# Patient Record
Sex: Female | Born: 1980 | Race: Black or African American | Hispanic: No | Marital: Married | Smoking: Never smoker
Health system: Southern US, Community
[De-identification: ages and names within clinical notes are randomized; demographics above are authoritative.]

## PROBLEM LIST (undated history)

## (undated) DIAGNOSIS — F329 Major depressive disorder, single episode, unspecified: Secondary | ICD-10-CM

## (undated) DIAGNOSIS — N979 Female infertility, unspecified: Secondary | ICD-10-CM

## (undated) DIAGNOSIS — F32A Depression, unspecified: Secondary | ICD-10-CM

## (undated) DIAGNOSIS — K5090 Crohn's disease, unspecified, without complications: Secondary | ICD-10-CM

## (undated) DIAGNOSIS — D219 Benign neoplasm of connective and other soft tissue, unspecified: Secondary | ICD-10-CM

## (undated) DIAGNOSIS — F419 Anxiety disorder, unspecified: Secondary | ICD-10-CM

## (undated) HISTORY — DX: Anxiety disorder, unspecified: F41.9

## (undated) HISTORY — DX: Female infertility, unspecified: N97.9

## (undated) HISTORY — DX: Major depressive disorder, single episode, unspecified: F32.9

## (undated) HISTORY — PX: EYE SURGERY: SHX253

## (undated) HISTORY — DX: Benign neoplasm of connective and other soft tissue, unspecified: D21.9

## (undated) HISTORY — DX: Depression, unspecified: F32.A

---
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
## 2011-12-04 ENCOUNTER — Encounter (HOSPITAL_BASED_OUTPATIENT_CLINIC_OR_DEPARTMENT_OTHER): Payer: Self-pay | Admitting: *Deleted

## 2011-12-04 ENCOUNTER — Emergency Department (HOSPITAL_BASED_OUTPATIENT_CLINIC_OR_DEPARTMENT_OTHER)
Admission: EM | Admit: 2011-12-04 | Discharge: 2011-12-04 | Disposition: A | Discharge status: Home / Self Care | Payer: Self-pay | Attending: Emergency Medicine | Admitting: Emergency Medicine

## 2011-12-04 DIAGNOSIS — J069 Acute upper respiratory infection, unspecified: Secondary | ICD-10-CM

## 2011-12-04 DIAGNOSIS — J029 Acute pharyngitis, unspecified: Secondary | ICD-10-CM | POA: Insufficient documentation

## 2011-12-04 NOTE — Discharge Instructions (Signed)
Antibiotic Nonuse  Your caregiver felt that the infection or problem was not one that would be helped with an antibiotic. Infections may be caused by viruses or bacteria. Only a caregiver can tell which one of these is the likely cause of an illness. A cold is the most common cause of infection in both adults and children. A cold is a virus. Antibiotic treatment will have no effect on a viral infection. Viruses can lead to many lost days of work caring for sick children and many missed days of school. Children may catch as many as 10 "colds" or "flus" per year during which they can be tearful, cranky, and uncomfortable. The goal of treating a virus is aimed at keeping the ill person comfortable. Antibiotics are medications used to help the body fight bacterial infections. There are relatively few types of bacteria that cause infections but there are hundreds of viruses. While both viruses and bacteria cause infection they are very different types of germs. A viral infection will typically go away by itself within 7 to 10 days. Bacterial infections may spread or get worse without antibiotic treatment. Examples of bacterial infections are:  Sore throats (like strep throat or tonsillitis).   Infection in the lung (pneumonia).   Ear and skin infections.  Examples of viral infections are:  Colds or flus.   Most coughs and bronchitis.   Sore throats not caused by Strep.   Runny noses.  It is often best not to take an antibiotic when a viral infection is the cause of the problem. Antibiotics can kill off the helpful bacteria that we have inside our body and allow harmful bacteria to start growing. Antibiotics can cause side effects such as allergies, nausea, and diarrhea without helping to improve the symptoms of the viral infection. Additionally, repeated uses of antibiotics can cause bacteria inside of our body to become resistant. That resistance can be passed onto harmful bacterial. The next time  you have an infection it may be harder to treat if antibiotics are used when they are not needed. Not treating with antibiotics allows our own immune system to develop and take care of infections more efficiently. Also, antibiotics will work better for [place] when they are prescribed for bacterial infections. Treatments for a child that is ill may include:  Give extra fluids throughout the day to stay hydrated.   Get plenty of rest.   Only give your child over-the-counter or prescription medicines for pain, discomfort, or fever as directed by your caregiver.   The use of a cool mist humidifier may help stuffy noses.   Cold medications if suggested by your caregiver.  Your caregiver may decide to start you on an antibiotic if:  The problem you were seen for today continues for a longer length of time than expected.   You develop a secondary bacterial infection.  SEEK MEDICAL CARE IF:  Fever lasts longer than 5 days.   Symptoms continue to get worse after 5 to 7 days or become severe.   Difficulty in breathing develops.  Signs of dehydration develop (poor Upper Respiratory Infection, Adult An upper respiratory infection (URI) is also known as the common cold. It is often caused by a type of germ (virus). Colds are easily spread (contagious). You can pass it to others by kissing, coughing, sneezing, or drinking out of the same glass. Usually, you get better in 1 or 2 weeks.  HOME CARE  Only take medicine as told by your doctor.  Use  a warm mist humidifier or breathe in steam from a hot shower.  Drink enough water and fluids to keep your pee (urine) clear or pale yellow.  Get plenty of rest.  Return to work when your temperature is back to normal or as told by your doctor. You may use a face mask and wash your hands to stop your cold from spreading.  GET HELP RIGHT AWAY IF:  After the first few days, you feel you are getting worse.  You have questions about your medicine.  You have chills,  shortness of breath, or brown or red spit (mucus).  You have yellow or brown snot (nasal discharge) or pain in the face, especially when you bend forward.  You have a fever, puffy (swollen) neck, pain when you swallow, or white spots in the back of your throat.  You have a bad headache, ear pain, sinus pain, or chest pain.  You have a high-pitched whistling sound when you breathe in and out (wheezing).  You have a lasting cough or cough up blood.  You have sore muscles or a stiff neck.  MAKE SURE YOU:  Understand these instructions.  Will watch your condition.  Will get help right away if you are not doing well or get worse.  Document Released: 02/15/2008 Document Revised: 08/18/2011 Document Reviewed: 01/03/2011  [employer] Patient Information 2012 [employer]drinking, rare urinating, dark colored urine).   Changes in behavior or worsening tiredness (listlessness or lethargy).  Document Released: 11/07/2001 Document Revised: 08/18/2011 Document Reviewed: 05/06/2009 [facility] Patient Information 2012 [place].

## 2011-12-04 NOTE — ED Notes (Signed)
Pt states that other family members have been sick with similar s/s. She describes sore throat since last night. Congested.

## 2011-12-04 NOTE — ED Provider Notes (Signed)
History     CSN: [id number]  Arrival date & time 12/04/11  1245   First MD Initiated Contact with Patient 12/04/11 1351      Chief Complaint  Patient presents with  . Sore Throat    (Consider location/radiation/quality/duration/timing/severity/associated sxs/prior treatment) HPI  Patient with cough and cold symptoms for several days. She has had nasal congestion. She has not had fever at home. She's been taking over-the-counter cold medicines without relief. She denies nausea vomiting or diarrhea.  History reviewed. No pertinent past medical history.  Past Surgical History  Procedure Date  . Colon surgery   . Eye surgery     History reviewed. No pertinent family history.  History  Substance Use Topics  . Smoking status: Never Smoker   . Smokeless tobacco: Not on file  . Alcohol Use: No    OB History    Grav Para Term Preterm Abortions TAB SAB Ect Mult Living                  Review of Systems  All other systems reviewed and are negative.    Allergies  Review of patient's allergies indicates no known allergies.  Home Medications  No current outpatient prescriptions on file.  BP 119/73  Pulse 86  Temp(Src) 98.5 F (36.9 C) (Oral)  Resp 20  Ht 5' 5"  (1.651 m)  Wt 180 lb (81.647 kg)  BMI 29.95 kg/m2  SpO2 99%  LMP 11/13/2011  Physical Exam  Nursing note and vitals reviewed. Constitutional: She is oriented to person, place, and time. She appears well-developed and well-nourished.  HENT:  Head: Normocephalic and atraumatic.  Eyes: Conjunctivae and EOM are normal. Pupils are equal, round, and reactive to light.  Neck: Normal range of motion. Neck supple.  Cardiovascular: Normal rate, regular rhythm, normal heart sounds and intact distal pulses.   Pulmonary/Chest: Effort normal and breath sounds normal.  Abdominal: Soft. Bowel sounds are normal.  Musculoskeletal: Normal range of motion.  Neurological: She is alert and oriented to person, place, and  time.  Skin: Skin is warm and dry.  Psychiatric: She has a normal mood and affect. Thought content normal.    ED Course  Procedures (including critical care time)   Labs Reviewed  RAPID STREP SCREEN   No results found.   No diagnosis found.    MDM  The patient has strep screen done prior to my valuation which is negative.       Shaune Pollack, MD 12/04/11 [phone]

## 2014-02-26 ENCOUNTER — Encounter (HOSPITAL_COMMUNITY): Payer: Self-pay | Admitting: Emergency Medicine

## 2014-02-26 ENCOUNTER — Emergency Department (HOSPITAL_COMMUNITY)
Admission: EM | Admit: 2014-02-26 | Discharge: 2014-02-26 | Disposition: A | Discharge status: Home / Self Care | Payer: Self-pay | Attending: Emergency Medicine | Admitting: Emergency Medicine

## 2014-02-26 DIAGNOSIS — K921 Melena: Secondary | ICD-10-CM | POA: Insufficient documentation

## 2014-02-26 DIAGNOSIS — Z79899 Other long term (current) drug therapy: Secondary | ICD-10-CM | POA: Insufficient documentation

## 2014-02-26 LAB — COMPREHENSIVE METABOLIC PANEL
ALBUMIN: 3.6 g/dL (ref 3.5–5.2)
ALK PHOS: 88 U/L (ref 39–117)
ALT: 14 U/L (ref 0–35)
AST: 17 U/L (ref 0–37)
BILIRUBIN TOTAL: 0.2 mg/dL — AB (ref 0.3–1.2)
BUN: 6 mg/dL (ref 6–23)
CHLORIDE: 101 meq/L (ref 96–112)
CO2: 22 meq/L (ref 19–32)
Calcium: 9.5 mg/dL (ref 8.4–10.5)
Creatinine, Ser: 0.85 mg/dL (ref 0.50–1.10)
GFR calc Af Amer: >90 mL/min (ref >90)
GFR, EST NON AFRICAN AMERICAN: 88 mL/min — AB (ref >90)
GLUCOSE: 108 mg/dL — AB (ref 70–99)
POTASSIUM: 3.5 meq/L — AB (ref 3.7–5.3)
Sodium: 137 mEq/L (ref 137–147)
Total Protein: 8 g/dL (ref 6.0–8.3)

## 2014-02-26 LAB — TYPE AND SCREEN
ABO/RH(D): O POS
Antibody Screen: NEGATIVE

## 2014-02-26 LAB — CBC
HEMATOCRIT: 41 % (ref 36.0–46.0)
HEMOGLOBIN: 13.7 g/dL (ref 12.0–15.0)
MCH: 27.9 pg (ref 26.0–34.0)
MCHC: 33.4 g/dL (ref 30.0–36.0)
MCV: 83.5 fL (ref 78.0–100.0)
Platelets: 319 10*3/uL (ref 150–400)
RBC: 4.91 MIL/uL (ref 3.87–5.11)
RDW: 13 % (ref 11.5–15.5)
WBC: 8.1 10*3/uL (ref 4.0–10.5)

## 2014-02-26 LAB — ABO/RH: ABO/RH(D): O POS

## 2014-02-26 MED ORDER — HYDROXYZINE HCL 25 MG PO TABS: 25.0000 mg | ORAL_TABLET | Freq: Every evening | ORAL | Status: DC | PRN

## 2014-02-26 NOTE — ED Notes (Addendum)
Patient reports dark stools for "awhile" intermittently, denies any prior evaluation. Patient reports several days of dark/bloody stools that trigger her presence in the ER this am. Patient also reports rectal pain and some diarrhea. BMx4-5 in the past 24 hours. Patient reports hx of Chron's disease that she had surgery for in 1998

## 2014-02-26 NOTE — ED Provider Notes (Addendum)
CSN: [id number]     Arrival date & time 02/26/14  2751 History   First MD Initiated Contact with Patient 02/26/14 [phone]     Chief Complaint  Patient presents with  . Rectal Bleeding    dark, intermittent few "awhile"     (Consider location/radiation/quality/duration/timing/severity/associated sxs/prior Treatment) Patient is a 33 y.o. female presenting with hematochezia. The history is provided by the patient.  Rectal Bleeding Quality: dark red. Amount:  Unable to specify Duration: years, intermittently. Timing:  Intermittent Progression:  Unchanged Chronicity:  New Context: diarrhea (intermittent)   Similar prior episodes: yes   Relieved by:  Nothing Worsened by:  Nothing tried Ineffective treatments:  None tried Associated symptoms: no abdominal pain, no dizziness, no fever and no vomiting     History reviewed. No pertinent past medical history. Past Surgical History  Procedure Laterality Date  . Colon surgery    . Eye surgery     No family history on file. History  Substance Use Topics  . Smoking status: Never Smoker   . Smokeless tobacco: Not on file  . Alcohol Use: No     Comment: occ   OB History   Grav Para Term Preterm Abortions TAB SAB Ect Mult Living                 Review of Systems  Constitutional: Negative for fever and fatigue.  HENT: Negative for congestion and drooling.   Eyes: Negative for pain.  Respiratory: Negative for cough and shortness of breath.   Cardiovascular: Negative for chest pain.  Gastrointestinal: Positive for hematochezia. Negative for nausea, vomiting, abdominal pain and diarrhea.  Genitourinary: Negative for dysuria and hematuria.       Blood in stool  Musculoskeletal: Negative for back pain, gait problem and neck pain.  Skin: Negative for color change.  Neurological: Negative for dizziness and headaches.  Hematological: Negative for adenopathy.  Psychiatric/Behavioral: Negative for behavioral problems.  All other systems  reviewed and are negative.     Allergies  Review of patient's allergies indicates no known allergies.  Home Medications   Prior to Admission medications   Medication Sig Start Date End Date Taking? Authorizing Provider  Cyanocobalamin (VITAMIN B-12 PO) Take 1 tablet by mouth daily.   Yes Historical Provider, MD  DiphenhydrAMINE HCl (ZZZQUIL) 50 MG/30ML LIQD Take 30 mLs by mouth at bedtime as needed (for sleep).   Yes Historical Provider, MD  ibuprofen (ADVIL,MOTRIN) 200 MG tablet Take 400-600 mg by mouth every 6 (six) hours as needed (for pain.). Vitamin b-12 adult mult 1qd yest   Yes Historical Provider, MD  Multiple Vitamin (MULTIVITAMIN WITH MINERALS) TABS tablet Take 1 tablet by mouth daily.   Yes Historical Provider, MD  hydrOXYzine (ATARAX/VISTARIL) 25 MG tablet Take 1 tablet (25 mg total) by mouth at bedtime as needed. 02/26/14   Blanchard Kelch, MD   BP 142/86  Pulse 104  Temp(Src) 99 F (37.2 C) (Oral)  Resp 16  SpO2 100%  LMP 01/13/2014 Physical Exam  Nursing note and vitals reviewed. Constitutional: She is oriented to person, place, and time. She appears well-developed and well-nourished.  HENT:  Head: Normocephalic and atraumatic.  Mouth/Throat: Oropharynx is clear and moist. No oropharyngeal exudate.  Eyes: Conjunctivae and EOM are normal. Pupils are equal, round, and reactive to light.  Neck: Normal range of motion. Neck supple.  Cardiovascular: Normal rate, regular rhythm, normal heart sounds and intact distal pulses.  Exam reveals no gallop and no friction rub.  No murmur heard. Pulmonary/Chest: Effort normal and breath sounds normal. No respiratory distress. She has no wheezes.  Abdominal: Soft. Bowel sounds are normal. There is no tenderness. There is no rebound and no guarding.  Musculoskeletal: Normal range of motion. She exhibits no edema and no tenderness.  Neurological: She is alert and oriented to person, place, and time.  Skin: Skin is warm and dry.   Psychiatric: She has a normal mood and affect. Her behavior is normal.    ED Course  Procedures (including critical care time) Labs Review Labs Reviewed  COMPREHENSIVE METABOLIC PANEL - Abnormal; Notable for the following:    Potassium 3.5 (*)    Glucose, Bld 108 (*)    Total Bilirubin 0.2 (*)    GFR calc non Af Amer 88 (*)    All other components within normal limits  CBC  TYPE AND SCREEN  ABO/RH    Imaging Review No results found.   EKG Interpretation None      MDM   Final diagnoses:  Blood in stool    7:24 AM 33 y.o. female with a history of Crohn's status post a colon surgery in 1998 who presents with intermittent blood in her stools for years. She notes she saw some blood when wiping 2 days ago and then again last night. She denies any fevers, vomiting, or abdominal pain. She has intermittent diarrhea. She is afebrile and mildly tachycardic with initial triage vitals. She has no complaints currently on exam. She states that she does not have a primary care provider or a GI Dr. Heber [place] work was performed prior to my evaluation and is noncontributory. She has no symptoms of anemia. She has a soft benign abdomen. I offered a rectal exam but she preferred to defer this and followup with a primary care doctor or GI physician. As she is well-appearing and in no acute distress I think this is reasonable. We also have a good reason for her occasional bloody stools.  7:26 AM:  I have discussed the diagnosis/risks/treatment options with the patient and believe the pt to be eligible for discharge home to follow-up with GI and estab w/ a pcp. Pt not sleeping well, will provide vistaril. We also discussed returning to the ED immediately if new or worsening sx occur. We discussed the sx which are most concerning (e.g., pain, fever, vomiting, worsening of bloody stools, sx of anemia) that necessitate immediate return. Medications administered to the patient during their visit and any new  prescriptions provided to the patient are listed below.  Medications given during this visit Medications - No data to display  New Prescriptions   HYDROXYZINE (ATARAX/VISTARIL) 25 MG TABLET    Take 1 tablet (25 mg total) by mouth at bedtime as needed.       Blanchard Kelch, MD 02/26/14 [place], MD 02/26/14 [phone]

## 2014-02-26 NOTE — ED Notes (Signed)
Pt asked that she have a female nurse. Unable to do assessment or start an IV

## 2014-04-28 ENCOUNTER — Encounter (HOSPITAL_BASED_OUTPATIENT_CLINIC_OR_DEPARTMENT_OTHER): Payer: Self-pay | Admitting: Emergency Medicine

## 2014-04-28 ENCOUNTER — Emergency Department (HOSPITAL_BASED_OUTPATIENT_CLINIC_OR_DEPARTMENT_OTHER): Payer: Self-pay

## 2014-04-28 ENCOUNTER — Emergency Department (HOSPITAL_BASED_OUTPATIENT_CLINIC_OR_DEPARTMENT_OTHER)
Admission: EM | Admit: 2014-04-28 | Discharge: 2014-04-28 | Disposition: A | Discharge status: Home / Self Care | Payer: Self-pay | Attending: Emergency Medicine | Admitting: Emergency Medicine

## 2014-04-28 DIAGNOSIS — Z791 Long term (current) use of non-steroidal anti-inflammatories (NSAID): Secondary | ICD-10-CM | POA: Insufficient documentation

## 2014-04-28 DIAGNOSIS — R079 Chest pain, unspecified: Secondary | ICD-10-CM | POA: Insufficient documentation

## 2014-04-28 DIAGNOSIS — R071 Chest pain on breathing: Secondary | ICD-10-CM | POA: Insufficient documentation

## 2014-04-28 DIAGNOSIS — R0789 Other chest pain: Secondary | ICD-10-CM

## 2014-04-28 LAB — TROPONIN I

## 2014-04-28 MED ORDER — ACETAMINOPHEN 500 MG PO TABS
1000.0000 mg | ORAL_TABLET | Freq: Once | ORAL | Status: AC
Start: 2014-04-28 — End: 2014-04-28
  Administered 2014-04-28: 1000 mg via ORAL
  Filled 2014-04-28: qty 2

## 2014-04-28 NOTE — Discharge Instructions (Signed)
Chest Wall Pain Take Tylenol for pain as directed. Call any of the numbers on the resource guide to get a primary care physician Chest wall pain is pain in or around the bones and muscles of your chest. It may take up to 6 weeks to get better. It may take longer if you must stay physically active in your work and activities.  CAUSES  Chest wall pain may happen on its own. However, it may be caused by:  A viral illness like the flu.  Injury.  Coughing.  Exercise.  Arthritis.  Fibromyalgia.  Shingles. HOME CARE INSTRUCTIONS   Avoid overtiring physical activity. Try not to strain or perform activities that cause pain. This includes any activities using your chest or your abdominal and side muscles, especially if heavy weights are used.  Put ice on the sore area.  Put ice in a plastic bag.  Place a towel between your skin and the bag.  Leave the ice on for 15-20 minutes per hour while awake for the first 2 days.  Only take over-the-counter or prescription medicines for pain, discomfort, or fever as directed by your caregiver. SEEK IMMEDIATE MEDICAL CARE IF:   Your pain increases, or you are very uncomfortable.  You have a fever.  Your chest pain becomes worse.  You have new, unexplained symptoms.  You have nausea or vomiting.  You feel sweaty or lightheaded.  You have a cough with phlegm (sputum), or you cough up blood. MAKE SURE YOU:   Understand these instructions.  Will watch your condition.  Will get help right away if you are not doing well or get worse. Document Released: 08/29/2005 Document Revised: 11/21/2011 Document Reviewed: 04/25/2011 [facility] Patient Information 2015 [place], [place]. This information is not intended to replace advice given to you by your health care provider. Make sure you discuss any questions you have with your health care provider.  Emergency Department Resource Guide 1) Find a Doctor and Pay Out of Pocket Although you won't have  to find out who is covered by your insurance plan, it is a good idea to ask around and get recommendations. You will then need to call the office and see if the doctor you have chosen will accept you as a new patient and what types of options they offer for patients who are self-pay. Some doctors offer discounts or will set up payment plans for their patients who do not have insurance, but you will need to ask so you aren't surprised when you get to your appointment.  2) Contact Your Local Health Department Not all health departments have doctors that can see patients for sick visits, but many do, so it is worth a call to see if yours does. If you don't know where your local health department is, you can check in your phone book. The CDC also has a tool to help you locate your state's health department, and many state websites also have listings of all of their local health departments.  3) Find a [facility] If your illness is not likely to be very severe or complicated, you may want to try a walk in clinic. These are popping up all over the country in pharmacies, drugstores, and shopping centers. They're usually staffed by nurse practitioners or physician assistants that have been trained to treat common illnesses and complaints. They're usually fairly quick and inexpensive. However, if you have serious medical issues or chronic medical problems, these are probably not your best option.  No Primary  Care Doctor: - Call Health Connect at  [phone] - they can help you locate a primary care doctor that  accepts your insurance, provides certain services, etc. - Physician Referral Service- [phone]  Chronic Pain Problems: Organization         Address  Phone   Notes  [facility]  [phone] Patients need to be referred by their primary care doctor.   Medication Assistance: Organization         Address  Phone   Notes  [facility] Medication [facility] [address] [place], [place] [address] [phone] --Must be a resident of [facility] -- Must have NO insurance coverage whatsoever (no Medicaid/ Medicare, etc.) -- The pt. MUST have a primary care doctor that directs their care regularly and follows them in the community   MedAssist  [phone]   [employer]  [phone]    Agencies that provide inexpensive medical care: Organization         Address  Phone   Notes  [place]  [phone]   [facility]    [phone]   [facility] [place], [place] [address] [phone]   [place] [address] [place] [phone]   Planned Parenthood    [phone]   [facility]    [phone]   [place] and [place] [address], [place] Phone:  [phone], Fax:  [phone] Hours of Operation:  9 am - 6 pm, M-F.  Also accepts Medicaid/Medicare and self-pay.  [facility] for [place] [employer], [address], [place] Phone: [phone], Fax: [phone]. Hours of Operation:  8:30 am - 5:30 pm, M-F.  Also accepts Medicaid and self-pay.  [facility] [place] [address], [place] Phone: [phone]   [place], [place], [place] [phone], Ext. [phone] Mondays & Thursdays: 7-9 AM.  First 15 patients are seen on a first come, first serve basis.    [place] Providers:  Organization         Address  Phone   Notes  [facility] [address], [place] [phone] Also accepts self-pay patients.  [facility] [address], [place]  [phone]   [place], [address], [place] [phone]   [facility] [address], [place] [phone]   Lucianne Lei [address], [place]   [phone] Only accepts [place] Access [place] patients after they have their name applied to their card.   Self-Pay (no insurance) in [facility]:  Organization         Address  Phone   Notes  Sickle Cell Patients, [facility] [place] [phone]   [facility] [place] [phone]   [facility] [place]  [place], [place], [place] [phone]   [facility]/Dr. Osei-Bonsu  [address], [place] or [address], [place] [phone] Phone number for both [place] and [place] locations is the same.  Urgent Medical and [facility] [address], [place] [phone]  [facility] [address], [place] or [address] [phone]   [facility] [place], [place] [phone], phone; [phone], fax Sees patients 1st and 3rd Saturday of every month.  Must not qualify for public or private insurance (i.e. Medicaid, Medicare, [place] Health Choice, Veterans' Benefits)  Household income should be no more than 200% of the poverty level The clinic cannot treat you if you are pregnant or think you are pregnant  Sexually transmitted diseases are not treated at the clinic.    Dental Care: Organization         Address  Phone  Notes  [facility] Department of [facility] [place] [phone] Accepts children up to age 77 who are enrolled in [place] or [place]; pregnant women with a Medicaid card; and children who have applied for Medicaid or [place] Health Choice, but were declined, whose parents can pay a reduced fee at time of service.  [facility] Department of [facility]  [address], [place] [phone] Accepts children up to age  87 who are enrolled in [place] or [place]; pregnant women with a Medicaid card; and children who have applied for Medicaid or [place] Health Choice, but were declined, whose parents can pay a reduced fee at time of service.  [place] Adult Dental Access PROGRAM  [place] [phone] Patients are seen by appointment only. Walk-ins are not accepted. [place] will see patients 62 years of age and older. Monday - Tuesday (8am-5pm) Most Wednesdays (8:30-5pm) $30 per visit, cash only  [facility] Adult Dental Access PROGRAM  [address], [facility] [phone] Patients are seen by appointment only. Walk-ins are not accepted. [place] will see patients 41 years of age and older. One Wednesday Evening (Monthly: Volunteer Based).  $30 per visit, cash only  [place]  [phone] for adults; Children under age 24, call Graduate Pediatric Dentistry at [phone]. Children aged 16-14, please call [phone] to request a pediatric application.  Dental services are provided in all areas of dental care including fillings, crowns and bridges, complete and partial dentures, implants, gum treatment, root canals, and extractions. Preventive care is also provided. Treatment is provided to both adults and children. Patients are selected via a lottery and there is often a waiting list.   [facility] [address], [place]  [phone] [email or web address]   [facility] [address] [place], [place] [phone], Ext. [phone] Second and Fourth Thursday of each month, opens at 6:30 AM; Clinic ends at 9 AM.  Patients are seen on a first-come first-served basis, and a limited number are seen during each clinic.   [facility]  [address] Hillard Danker [place], [place] [phone]   Eligibility Requirements You must have lived in [place], [place], or [place] counties for at least the last three  months.   You cannot be eligible for state or federal sponsored [employer], including [employer], [place], or [employer].   You generally cannot be eligible for healthcare insurance through your employer.    How to apply: Eligibility screenings are held every Tuesday and Wednesday afternoon from 1:00 pm until 4:00 pm. You do not need an appointment for the interview!  [facility] [address] [place], [place]  [place]  [place] Department  [place]  [phone]    Behavioral Health Resources in the Community: Intensive Outpatient Programs Organization         Address  Phone  Notes  [place]. [address] [place], [place] [phone]   [facility] Outpatient [address] [place], [place]   ADS: Alcohol & Drug Svcs [address] [place], [place]   [place] [address]  [place], [place] or [phone]   Substance Abuse Resources Organization         Address  Phone  Notes  Alcohol and Drug Services  [phone]   [place]  [phone]   [facility] [place]   Chinita Pester  [phone]   Residential & Outpatient Substance Abuse Program  [phone]   Psychological Services Organization         Address  Phone  Notes  [facility]  [place]  [phone]   [place] [address], [place] or [phone]    Mobile Crisis Teams Organization         Address  Phone  Notes  [facility], Mobile Crisis Care Unit  [phone]   [facility]  [address] [place], [place]   [place] [address], [place] [phone]    Self-Help/Support  Groups Organization         Address  Phone             Notes  [place]. of [place] - variety of support groups  [place] Call for more information  Narcotics Anonymous (NA), [facility]aring [facility] [address], [employer] [place]  2 meetings at this location   Special educational needs teacher         Address  Phone  Notes  [facility]idential Treatment [place],    [place]  [phone]   [facility]  [address], [place] [address], [place], [place]   [place], [place] [phone] Admissions: 8am-3pm M-F  Incentives Substance [place] [address]    [place], [place] [phone]   [facility] [address] [place], [place], [place]   [facility] [address],  [place], [place]   [facility] - Intensive Outpatient [address] Kristeen [place] [address], [place], [place]   [facility] [place].,  [place], [place] [phone] or [phone]   [facility]idential Treatment Services (RTS) [address], [place], [place] Accepts Medicaid  [facility] [address]  [place] [phone] Substance Abuse/Addiction Treatment   [facility] Organization         Address  Phone  Notes  [facility]  [phone]   Domenic Schwab, PhD [address] Arlis Porta [place], [place]   [phone] or [phone]   [place] [employer], [place] [phone]   [place] [address], [place], [place] [phone] Insurance/Medicaid/sponsorship through [employer] and Families [address], [place] [address]  [place], [place] [phone] [place], [place] [phone]    Dr. Adele Schilder  [phone]   [facility] Dept. 1) [address] [place] 2) [place] 3)  [place] [address], [place] [phone]  [phone]   [place] [phone] or [phone] (After Hours)

## 2014-04-28 NOTE — ED Provider Notes (Addendum)
CSN: [id number]     Arrival date & time 04/28/14  1559 History  This chart was scribed for Orlie Dakin, MD by Ladene Artist, ED Scribe. The patient was seen in room MH05/MH05. Patient's care was started at 4:13 PM.   Chief Complaint  Patient presents with  . Chest Pain    Patient is a 33 y.o. female presenting with chest pain. The history is provided by the patient. No language interpreter was used.  Chest Pain Pain location:  L chest Pain radiates to:  Does not radiate Pain radiates to the back: no   Pain severity:  Mild Onset quality:  Unable to specify Duration:  12 months Timing:  Intermittent Progression:  Unchanged Chronicity:  Recurrent Relieved by:  Certain positions Worsened by:  Certain positions Ineffective treatments:  None tried Associated symptoms: no fever and no shortness of breath   Risk factors: no high cholesterol and no hypertension    no cardiac risk factors HPI Comments: Vicki Lee is a 33 y.o. female who presents to the Emergency Department complaining of intermittent L sided chest pain over the past year. Pain is exacerbated with laying in certain positions and improved with laying in certain positions. Duration of pain varies from 15 minutes to hours.  Has had mild discomfort since awakening this morning Pt reports mild pain at this time. No SOB, fever. Pt has not taken any medication for pain. Pt has not seen a doctor for pain. Hx Crohn's disease diagnosed at [facility] in 1998. Pt is a nonsmoker, no alcohol or illicit drug use. No known allergies. No PCP. LNMP ended yesterday.   Pt's father has h/o Chron's disease. No family h/o heart complications. No hx DM, hypertension or high cholesterol.   History reviewed. No pertinent past medical history. Past Surgical History  Procedure Laterality Date  . Colon surgery    . Eye surgery     History reviewed. No pertinent family history. History  Substance Use Topics  . Smoking status: Never Smoker    . Smokeless tobacco: Not on file  . Alcohol Use: No     Comment: occ   OB History   Grav Para Term Preterm Abortions TAB SAB Ect Mult Living                 Review of Systems  Constitutional: Negative.  Negative for fever.  HENT: Negative.   Respiratory: Negative.  Negative for shortness of breath.   Cardiovascular: Positive for chest pain.  Gastrointestinal: Negative.   Musculoskeletal: Negative.   Skin: Negative.   Neurological: Negative.   Psychiatric/Behavioral: Negative.   All other systems reviewed and are negative.  Allergies  Review of patient's allergies indicates no known allergies.  Home Medications   Prior to Admission medications   Medication Sig Start Date End Date Taking? Authorizing Provider  Cyanocobalamin (VITAMIN B-12 PO) Take 1 tablet by mouth daily.    Historical Provider, MD  DiphenhydrAMINE HCl (ZZZQUIL) 50 MG/30ML LIQD Take 30 mLs by mouth at bedtime as needed (for sleep).    Historical Provider, MD  hydrOXYzine (ATARAX/VISTARIL) 25 MG tablet Take 1 tablet (25 mg total) by mouth at bedtime as needed. 02/26/14   Pamella Pert, MD  ibuprofen (ADVIL,MOTRIN) 200 MG tablet Take 400-600 mg by mouth every 6 (six) hours as needed (for pain.). Vitamin b-12 adult mult 1qd yest    Historical Provider, MD  Multiple Vitamin (MULTIVITAMIN WITH MINERALS) TABS tablet Take 1 tablet by mouth daily.    Historical  Provider, MD   Triage Vitals: BP 124/81  Pulse 75  Temp(Src) 99 F (37.2 C) (Oral)  Resp 16  Ht 5' 6"  (1.676 m)  Wt 185 lb (83.915 kg)  BMI 29.87 kg/m2  SpO2 98%  LMP 04/27/2014 Physical Exam  Nursing note and vitals reviewed. Constitutional: She appears well-developed and well-nourished.  HENT:  Head: Normocephalic and atraumatic.  Eyes: Conjunctivae are normal. Pupils are equal, round, and reactive to light.  Neck: Neck supple. No tracheal deviation present. No thyromegaly present.  Cardiovascular: Normal rate, regular rhythm and normal heart  sounds.   No murmur heard. Pulmonary/Chest: Effort normal and breath sounds normal. She exhibits tenderness.  Exquisitely tender over sternum, reproducing pain exactly  Abdominal: Soft. Bowel sounds are normal. She exhibits no distension. There is no tenderness.  Musculoskeletal: Normal range of motion. She exhibits no edema and no tenderness.  Neurological: She is alert. Coordination normal.  Skin: Skin is warm and dry. No rash noted.  Psychiatric: She has a normal mood and affect.   ED Course  Procedures (including critical care time) DIAGNOSTIC STUDIES: Oxygen Saturation is 98% on RA, normalby my interpretation.    COORDINATION OF CARE: 4:20 PM-Discussed treatment plan which includes CXR with pt at bedside and pt agreed to plan.   Labs Review Labs Reviewed  TROPONIN I   Imaging Review Dg Chest 2 View  04/28/2014   CLINICAL DATA:  Intermittent (though now worsening) left-sided chest pain.  EXAM: CHEST  2 VIEW  COMPARISON:  None.  FINDINGS: Normal cardiac silhouette and mediastinal contours. No focal parenchymal opacities. No pleural effusion or pneumothorax. There is minimal pleural parenchymal thickening about the bilateral major fissures. No evidence of edema. No acute osseus abnormalities.  IMPRESSION: No acute cardiopulmonary disease.   Electronically Signed   By: Sandi Mariscal M.D.   On: 04/28/2014 16:43    EKG Interpretation   Date/Time:  Monday April 28 2014 16:03:22 EDT Ventricular Rate:  77 PR Interval:  138 QRS Duration: 74 QT Interval:  352 QTC Calculation: 398 R Axis:   23 Text Interpretation:  Normal sinus rhythm Normal ECG No old tracing to  compare Confirmed by Padme Arriaga  MD, Adriyana Greenbaum [phone]) on 04/28/2014 4:07:27 PM     Chest xray viewed by me Results for orders placed during the hospital encounter of 04/28/14  TROPONIN I      Result Value Ref Range   Troponin I <0.30  <0.30 ng/mL   Dg Chest 2 View  04/28/2014   CLINICAL DATA:  Intermittent (though now  worsening) left-sided chest pain.  EXAM: CHEST  2 VIEW  COMPARISON:  None.  FINDINGS: Normal cardiac silhouette and mediastinal contours. No focal parenchymal opacities. No pleural effusion or pneumothorax. There is minimal pleural parenchymal thickening about the bilateral major fissures. No evidence of edema. No acute osseus abnormalities.  IMPRESSION: No acute cardiopulmonary disease.   Electronically Signed   By: Sandi Mariscal M.D.   On: 04/28/2014 16:43    5:10 PM patient requesting Tylenol. Ordered by me. MDM  PERC  Negative heart score =0. Exam most consistent with chest wall pain Plan Tylenol  for pain Referral resource guide Diagnosis chest wall pain Final diagnoses:  None      I personally performed the services described in this documentation, which was scribed in my presence. The recorded information has been reviewed and is accurate.    Orlie Dakin, MD 04/28/14 [place], MD 04/28/14 [phone]

## 2014-04-28 NOTE — ED Notes (Signed)
Pt c/o left sided chest pain x 2 years,  today   X 30 mins denies SOB n/v.

## 2015-05-09 ENCOUNTER — Encounter (HOSPITAL_BASED_OUTPATIENT_CLINIC_OR_DEPARTMENT_OTHER): Payer: Self-pay | Admitting: *Deleted

## 2015-05-09 ENCOUNTER — Emergency Department (HOSPITAL_BASED_OUTPATIENT_CLINIC_OR_DEPARTMENT_OTHER)
Admission: EM | Admit: 2015-05-09 | Discharge: 2015-05-09 | Disposition: A | Discharge status: Home / Self Care | Payer: Self-pay | Attending: Physician Assistant | Admitting: Physician Assistant

## 2015-05-09 DIAGNOSIS — F329 Major depressive disorder, single episode, unspecified: Secondary | ICD-10-CM | POA: Insufficient documentation

## 2015-05-09 DIAGNOSIS — Z0000 Encounter for general adult medical examination without abnormal findings: Secondary | ICD-10-CM | POA: Insufficient documentation

## 2015-05-09 DIAGNOSIS — Z79899 Other long term (current) drug therapy: Secondary | ICD-10-CM | POA: Insufficient documentation

## 2015-05-09 DIAGNOSIS — Z8719 Personal history of other diseases of the digestive system: Secondary | ICD-10-CM | POA: Insufficient documentation

## 2015-05-09 DIAGNOSIS — G479 Sleep disorder, unspecified: Secondary | ICD-10-CM | POA: Insufficient documentation

## 2015-05-09 DIAGNOSIS — R110 Nausea: Secondary | ICD-10-CM | POA: Insufficient documentation

## 2015-05-09 HISTORY — DX: Crohn's disease, unspecified, without complications: K50.90

## 2015-05-09 LAB — CBC WITH DIFFERENTIAL/PLATELET
BASOS ABS: 0 10*3/uL (ref 0.0–0.1)
Basophils Relative: 0 % (ref 0–1)
Eosinophils Absolute: 0.2 10*3/uL (ref 0.0–0.7)
Eosinophils Relative: 2 % (ref 0–5)
HEMATOCRIT: 40.7 % (ref 36.0–46.0)
HEMOGLOBIN: 13.5 g/dL (ref 12.0–15.0)
LYMPHS PCT: 32 % (ref 12–46)
Lymphs Abs: 2.5 10*3/uL (ref 0.7–4.0)
MCH: 27.6 pg (ref 26.0–34.0)
MCHC: 33.2 g/dL (ref 30.0–36.0)
MCV: 83.1 fL (ref 78.0–100.0)
MONO ABS: 0.7 10*3/uL (ref 0.1–1.0)
MONOS PCT: 10 % (ref 3–12)
NEUTROS ABS: 4.3 10*3/uL (ref 1.7–7.7)
Neutrophils Relative %: 56 % (ref 43–77)
Platelets: 323 10*3/uL (ref 150–400)
RBC: 4.9 MIL/uL (ref 3.87–5.11)
RDW: 13.1 % (ref 11.5–15.5)
WBC: 7.6 10*3/uL (ref 4.0–10.5)

## 2015-05-09 LAB — COMPREHENSIVE METABOLIC PANEL
ALBUMIN: 3.5 g/dL (ref 3.5–5.0)
ALT: 14 U/L (ref 14–54)
ANION GAP: 8 (ref 5–15)
AST: 20 U/L (ref 15–41)
Alkaline Phosphatase: 77 U/L (ref 38–126)
BUN: 7 mg/dL (ref 6–20)
CO2: 25 mmol/L (ref 22–32)
Calcium: 9.1 mg/dL (ref 8.9–10.3)
Chloride: 105 mmol/L (ref 101–111)
Creatinine, Ser: 0.88 mg/dL (ref 0.44–1.00)
GFR calc Af Amer: >60 mL/min (ref >60)
GFR calc non Af Amer: >60 mL/min (ref >60)
GLUCOSE: 99 mg/dL (ref 65–99)
POTASSIUM: 3.7 mmol/L (ref 3.5–5.1)
SODIUM: 138 mmol/L (ref 135–145)
Total Bilirubin: 0.3 mg/dL (ref 0.3–1.2)
Total Protein: 7.2 g/dL (ref 6.5–8.1)

## 2015-05-09 LAB — HCG, QUANTITATIVE, PREGNANCY: hCG, Beta Chain, Quant, S: <1 m[IU]/mL (ref <5)

## 2015-05-09 LAB — TROPONIN I: Troponin I: <0.03 ng/mL (ref <0.031)

## 2015-05-09 MED ORDER — IBUPROFEN 800 MG PO TABS
800.0000 mg | ORAL_TABLET | Freq: Once | ORAL | Status: AC
  Administered 2015-05-09: 800 mg via ORAL
  Filled 2015-05-09: qty 1

## 2015-05-09 MED ORDER — IBUPROFEN 800 MG PO TABS: 800.0000 mg | ORAL_TABLET | Freq: Three times a day (TID) | ORAL | Status: DC

## 2015-05-09 NOTE — ED Notes (Signed)
MD at bedside. 

## 2015-05-09 NOTE — ED Notes (Signed)
States having "other pains at times" primarily rt arm and rt foot pain also

## 2015-05-09 NOTE — ED Notes (Signed)
Presents with chest pain at left breast

## 2015-05-09 NOTE — ED Notes (Signed)
Pt reports ongoing chest pain since seen here for same last year- States Sx worsening over last week and states she feels like it is in her left breast

## 2015-05-09 NOTE — ED Provider Notes (Signed)
CSN: [id number]     Arrival date & time 05/09/15  1234 History  This chart was scribed for Courteney Julio Alm, MD by Helane Gunther, ED Scribe. This patient was seen in room MH08/MH08 and the patient's care was started at 3:00 PM.    Chief Complaint  Patient presents with  . Chest Pain   The history is provided by the patient. No language interpreter was used.    HPI Comments: Vicki Lee is a 34 y.o. female who presents to the Emergency Department complaining of constant, aching, left breast pain onset  1.5 years ago. She reports trouble sleeping, increased depression from baseline, and nausea. She was seen 1.5 years ago for the same, because she was concerned about breast cancer at the time. She reports no lumbs on home exam. She denies a FHx of breast cancer. Pt denies recent weight loss and fever.  Past Medical History  Diagnosis Date  . Crohn disease    Past Surgical History  Procedure Laterality Date  . Colon surgery    . Eye surgery     No family history on file. Social History  Substance Use Topics  . Smoking status: Never Smoker   . Smokeless tobacco: Never Used  . Alcohol Use: No     Comment: occ   OB History    No data available     Review of Systems  Constitutional: Negative for fever.  Cardiovascular: Positive for chest pain.  Gastrointestinal: Positive for nausea.  Psychiatric/Behavioral: Positive for dysphoric mood.  All other systems reviewed and are negative.   Allergies  Review of patient's allergies indicates no known allergies.  Home Medications   Prior to Admission medications   Medication Sig Start Date End Date Taking? Authorizing Provider  Cyanocobalamin (VITAMIN B-12 PO) Take 1 tablet by mouth daily.    Historical Provider, MD  DiphenhydrAMINE HCl (ZZZQUIL) 50 MG/30ML LIQD Take 30 mLs by mouth at bedtime as needed (for sleep).    Historical Provider, MD  hydrOXYzine (ATARAX/VISTARIL) 25 MG tablet Take 1 tablet (25 mg total) by mouth at  bedtime as needed. 02/26/14   Pamella Pert, MD  ibuprofen (ADVIL,MOTRIN) 200 MG tablet Take 400-600 mg by mouth every 6 (six) hours as needed (for pain.). Vitamin b-12 adult mult 1qd yest    Historical Provider, MD  Multiple Vitamin (MULTIVITAMIN WITH MINERALS) TABS tablet Take 1 tablet by mouth daily.    Historical Provider, MD   BP 112/60 mmHg  Pulse 86  Temp(Src) 98.3 F (36.8 C) (Oral)  Resp 18  Ht 5' 6"  (1.676 m)  Wt 190 lb (86.183 kg)  BMI 30.68 kg/m2  SpO2 99%  LMP 04/13/2015 Physical Exam  Constitutional: She is oriented to person, place, and time. She appears well-developed and well-nourished.  Tearful  HENT:  Head: Normocephalic and atraumatic.  Eyes: Conjunctivae are normal. Right eye exhibits no discharge. Left eye exhibits no discharge.  Pulmonary/Chest: Effort normal. No respiratory distress.  Breast exam done, no lumps notes.   Musculoskeletal: Normal range of motion.  Neurological: She is alert and oriented to person, place, and time. Coordination normal.  Skin: Skin is warm and dry. No rash noted. She is not diaphoretic. No erythema.  Psychiatric: She has a normal mood and affect.  Nursing note and vitals reviewed.   ED Course  Procedures  DIAGNOSTIC STUDIES: Oxygen Saturation is 99% on RA, normal by my interpretation.    COORDINATION OF CARE: 3:08 PM - Discussed plans to order diagnostic studies. Pt  advised of plan for treatment and pt agrees.  Labs Review Labs Reviewed  COMPREHENSIVE METABOLIC PANEL  CBC WITH DIFFERENTIAL/PLATELET  HCG, QUANTITATIVE, PREGNANCY  TROPONIN I    Imaging Review No results found. I have personally reviewed and evaluated these images and lab results as part of my medical decision-making.   EKG Interpretation   Date/Time:  Saturday May 09 2015 12:43:41 EDT Ventricular Rate:  75 PR Interval:  142 QRS Duration: 74 QT Interval:  354 QTC Calculation: 395 R Axis:   36 Text Interpretation:  Normal sinus rhythm  Normal ECG No significant change  since last tracing no acute ischemia Confirmed by Gerald Leitz  [phone]) on 05/09/2015 2:56:17 PM      MDM   Final diagnoses:  None    Patient is a 34 year old female presented with multiple complaints. Complaining of left breast pain, her breast exam is normal. We will encourage her to follow up with a primary care physician. We will give her a list of names of people that she can follow-up with. She may need an outpatient mammogram, however she has no lumps that she has noticed or that I can find on exam and she is below the age of mammogram. We will additionally do CBC and lites lites to make sure the patient does not have an electrolyes imbalance or anemia.  I'm concern that these symptoms could represent a reflection of her depressed mood, and will have her follow-up with her primary care provider. Denies SI/HI.      I personally performed the services described in this documentation, which was scribed in my presence. The recorded information has been reviewed and is accurate.   Courteney Julio Alm, MD 05/09/15 1610

## 2015-05-09 NOTE — Discharge Instructions (Signed)
We are sorry about all of your symptoms today. We are happy to report that your labs are normal. We want you to follow-up with her regular physician. We've attached list of resources to help you with that. Health Maintenance Adopting a healthy lifestyle and getting preventive care can go a long way to promote health and wellness. Talk with your health care provider about what schedule of regular examinations is right for you. This is a good chance for you to check in with your provider about disease prevention and staying healthy. In between checkups, there are plenty of things you can do on your own. Experts have done a lot of research about which lifestyle changes and preventive measures are most likely to keep you healthy. Ask your health care provider for more information. WEIGHT AND DIET  Eat a healthy diet  Be sure to include plenty of vegetables, fruits, low-fat dairy products, and lean protein.  Do not eat a lot of foods high in solid fats, added sugars, or salt.  Get regular exercise. This is one of the most important things you can do for your health.  Most adults should exercise for at least 150 minutes each week. The exercise should increase your heart rate and make you sweat (moderate-intensity exercise).  Most adults should also do strengthening exercises at least twice a week. This is in addition to the moderate-intensity exercise.  Maintain a healthy weight  Body mass index (BMI) is a measurement that can be used to identify possible weight problems. It estimates body fat based on height and weight. Your health care provider can help determine your BMI and help you achieve or maintain a healthy weight.  For females 46 years of age and older:   A BMI below 18.5 is considered underweight.  A BMI of 18.5 to 24.9 is normal.  A BMI of 25 to 29.9 is considered overweight.  A BMI of 30 and above is considered obese.  Watch levels of cholesterol and blood lipids  You should  start having your blood tested for lipids and cholesterol at 35 years of age, then have this test every 5 years.  You may need to have your cholesterol levels checked more often if:  Your lipid or cholesterol levels are high.  You are older than 35 years of age.  You are at high risk for heart disease.  CANCER SCREENING   Lung Cancer  Lung cancer screening is recommended for adults 78-81 years old who are at high risk for lung cancer because of a history of smoking.  A yearly low-dose CT scan of the lungs is recommended for people who:  Currently smoke.  Have quit within the past 15 years.  Have at least a 30-pack-year history of smoking. A pack year is smoking an average of one pack of cigarettes a day for 1 year.  Yearly screening should continue until it has been 15 years since you quit.  Yearly screening should stop if you develop a health problem that would prevent you from having lung cancer treatment.  Breast Cancer  Practice breast self-awareness. This means understanding how your breasts normally appear and feel.  It also means doing regular breast self-exams. Let your health care provider know about any changes, no matter how small.  If you are in your 20s or 30s, you should have a clinical breast exam (CBE) by a health care provider every 1-3 years as part of a regular health exam.  If you are 40 or  older, have a CBE every year. Also consider having a breast X-ray (mammogram) every year.  If you have a family history of breast cancer, talk to your health care provider about genetic screening.  If you are at high risk for breast cancer, talk to your health care provider about having an MRI and a mammogram every year.  Breast cancer gene (BRCA) assessment is recommended for women who have family members with BRCA-related cancers. BRCA-related cancers include:  Breast.  Ovarian.  Tubal.  Peritoneal cancers.  Results of the assessment will determine the  need for genetic counseling and BRCA1 and BRCA2 testing. Cervical Cancer Routine pelvic examinations to screen for cervical cancer are no longer recommended for nonpregnant women who are considered low risk for cancer of the pelvic organs (ovaries, uterus, and vagina) and who do not have symptoms. A pelvic examination may be necessary if you have symptoms including those associated with pelvic infections. Ask your health care provider if a screening pelvic exam is right for you.   The Pap test is the screening test for cervical cancer for women who are considered at risk.  If you had a hysterectomy for a problem that was not cancer or a condition that could lead to cancer, then you no longer need Pap tests.  If you are older than 65 years, and you have had normal Pap tests for the past 10 years, you no longer need to have Pap tests.  If you have had past treatment for cervical cancer or a condition that could lead to cancer, you need Pap tests and screening for cancer for at least 20 years after your treatment.  If you no longer get a Pap test, assess your risk factors if they change (such as having a new sexual partner). This can affect whether you should start being screened again.  Some women have medical problems that increase their chance of getting cervical cancer. If this is the case for you, your health care provider may recommend more frequent screening and Pap tests.  The human papillomavirus (HPV) test is another test that may be used for cervical cancer screening. The HPV test looks for the virus that can cause cell changes in the cervix. The cells collected during the Pap test can be tested for HPV.  The HPV test can be used to screen women 50 years of age and older. Getting tested for HPV can extend the interval between normal Pap tests from three to five years.  An HPV test also should be used to screen women of any age who have unclear Pap test results.  After 35 years of age,  women should have HPV testing as often as Pap tests.  Colorectal Cancer  This type of cancer can be detected and often prevented.  Routine colorectal cancer screening usually begins at 35 years of age and continues through 35 years of age.  Your health care provider may recommend screening at an earlier age if you have risk factors for colon cancer.  Your health care provider may also recommend using home test kits to check for hidden blood in the stool.  A small camera at the end of a tube can be used to examine your colon directly (sigmoidoscopy or colonoscopy). This is done to check for the earliest forms of colorectal cancer.  Routine screening usually begins at age 41.  Direct examination of the colon should be repeated every 5-10 years through 36 years of age. However, you may need to be  screened more often if early forms of precancerous polyps or small growths are found. Skin Cancer  Check your skin from head to toe regularly.  Tell your health care provider about any new moles or changes in moles, especially if there is a change in a mole's shape or color.  Also tell your health care provider if you have a mole that is larger than the size of a pencil eraser.  Always use sunscreen. Apply sunscreen liberally and repeatedly throughout the day.  Protect yourself by wearing long sleeves, pants, a wide-brimmed hat, and sunglasses whenever you are outside. HEART DISEASE, DIABETES, AND HIGH BLOOD PRESSURE   Have your blood pressure checked at least every 1-2 years. High blood pressure causes heart disease and increases the risk of stroke.  If you are between 55 years and 51 years old, ask your health care provider if you should take aspirin to prevent strokes.  Have regular diabetes screenings. This involves taking a blood sample to check your fasting blood sugar level.  If you are at a normal weight and have a low risk for diabetes, have this test once every three years after 35  years of age.  If you are overweight and have a high risk for diabetes, consider being tested at a younger age or more often. PREVENTING INFECTION  Hepatitis B  If you have a higher risk for hepatitis B, you should be screened for this virus. You are considered at high risk for hepatitis B if:  You were born in a country where hepatitis B is common. Ask your health care provider which countries are considered high risk.  Your parents were born in a high-risk country, and you have not been immunized against hepatitis B (hepatitis B vaccine).  You have HIV or AIDS.  You use needles to inject street drugs.  You live with someone who has hepatitis B.  You have had sex with someone who has hepatitis B.  You get hemodialysis treatment.  You take certain medicines for conditions, including cancer, organ transplantation, and autoimmune conditions. Hepatitis C  Blood testing is recommended for:  Everyone born from [birth year] through 1966.  Anyone with known risk factors for hepatitis C. Sexually transmitted infections (STIs)  You should be screened for sexually transmitted infections (STIs) including gonorrhea and chlamydia if:  You are sexually active and are younger than 35 years of age.  You are older than 35 years of age and your health care provider tells you that you are at risk for this type of infection.  Your sexual activity has changed since you were last screened and you are at an increased risk for chlamydia or gonorrhea. Ask your health care provider if you are at risk.  If you do not have HIV, but are at risk, it may be recommended that you take a prescription medicine daily to prevent HIV infection. This is called pre-exposure prophylaxis (PrEP). You are considered at risk if:  You are sexually active and do not regularly use condoms or know the HIV status of your partner(s).  You take drugs by injection.  You are sexually active with a partner who has HIV. Talk with  your health care provider about whether you are at high risk of being infected with HIV. If you choose to begin PrEP, you should first be tested for HIV. You should then be tested every 3 months for as long as you are taking PrEP.  PREGNANCY   If you are premenopausal and you  may become pregnant, ask your health care provider about preconception counseling.  If you may become pregnant, take 400 to 800 micrograms (mcg) of folic acid every day.  If you want to prevent pregnancy, talk to your health care provider about birth control (contraception). OSTEOPOROSIS AND MENOPAUSE   Osteoporosis is a disease in which the bones lose minerals and strength with aging. This can result in serious bone fractures. Your risk for osteoporosis can be identified using a bone density scan.  If you are 51 years of age or older, or if you are at risk for osteoporosis and fractures, ask your health care provider if you should be screened.  Ask your health care provider whether you should take a calcium or vitamin D supplement to lower your risk for osteoporosis.  Menopause may have certain physical symptoms and risks.  Hormone replacement therapy may reduce some of these symptoms and risks. Talk to your health care provider about whether hormone replacement therapy is right for you.  HOME CARE INSTRUCTIONS   Schedule regular health, dental, and eye exams.  Stay current with your immunizations.   Do not use any tobacco products including cigarettes, chewing tobacco, or electronic cigarettes.  If you are pregnant, do not drink alcohol.  If you are breastfeeding, limit how much and how often you drink alcohol.  Limit alcohol intake to no more than 1 drink per day for nonpregnant women. One drink equals 12 ounces of beer, 5 ounces of wine, or 1 ounces of hard liquor.  Do not use street drugs.  Do not share needles.  Ask your health care provider for help if you need support or information about quitting  drugs.  Tell your health care provider if you often feel depressed.  Tell your health care provider if you have ever been abused or do not feel safe at home. Document Released: 03/14/2011 Document Revised: 01/13/2014 Document Reviewed: 07/31/2013 [facility] Patient Information 2015 [place], [place]. This information is not intended to replace advice given to you by your health care provider. Make sure you discuss any questions you have with your health care provider.  Emergency Department Resource Guide 1) Find a Doctor and Pay Out of Pocket Although you won't have to find out who is covered by your insurance plan, it is a good idea to ask around and get recommendations. You will then need to call the office and see if the doctor you have chosen will accept you as a new patient and what types of options they offer for patients who are self-pay. Some doctors offer discounts or will set up payment plans for their patients who do not have insurance, but you will need to ask so you aren't surprised when you get to your appointment.  2) Contact Your Local Health Department Not all health departments have doctors that can see patients for sick visits, but many do, so it is worth a call to see if yours does. If you don't know where your local health department is, you can check in your phone book. The CDC also has a tool to help you locate your state's health department, and many state websites also have listings of all of their local health departments.  3) Find a [facility] If your illness is not likely to be very severe or complicated, you may want to try a walk in clinic. These are popping up all over the country in pharmacies, drugstores, and shopping centers. They're usually staffed by nurse practitioners or physician assistants  that have been trained to treat common illnesses and complaints. They're usually fairly quick and inexpensive. However, if you have serious medical issues or chronic medical  problems, these are probably not your best option.  No Primary Care Doctor: - Call Health Connect at  [phone] - they can help you locate a primary care doctor that  accepts your insurance, provides certain services, etc. - Physician Referral Service- [phone]  Chronic Pain Problems: Organization         Address  Phone   Notes  [facility]  [phone] Patients need to be referred by their primary care doctor.   Medication Assistance: Organization         Address  Phone   Notes  [facility] Medication [facility] [address] [place], [place] [address] [phone] --Must be a resident of [facility] -- Must have NO insurance coverage whatsoever (no Medicaid/ Medicare, etc.) -- The pt. MUST have a primary care doctor that directs their care regularly and follows them in the community   Med[facility]ist  [phone]   [employer]  [phone]    Agencies that provide inexpensive medical care: Organization         Address  Phone   Notes  [place]  [phone]   [facility]    [phone]   [facility] [place], [place] [address] [phone]   [place] [address], [place] [phone]   Planned Parenthood    [phone]   [facility]    [phone]   [place] and [place] [address], [place] Phone:  [phone], Fax:  [phone] Hours of Operation:  9 am - 6 pm, M-F.  Also accepts Medicaid/Medicare and self-pay.  [facility] for [place], [address], [place] Phone: [phone], Fax: [phone]. Hours of Operation:  8:30 am - 5:30 pm, M-F.  Also accepts Medicaid and self-pay.  [facility] [place] [address] [place] Phone: [phone]   [place], [place], [place] [phone], Ext. [phone]  Mondays & Thursdays: 7-9 AM.  First 15 patients are seen on a first come, first serve basis.    [place] Providers:  Organization         Address  Phone   Notes  [facility] [address],  [phone] Also accepts self-pay patients.  [facility] [address], [place]  [phone]   [place], [address], [place] [phone]   [facility] [address] [place] [phone]   Lucianne Lei [address], [place]   [phone] Only accepts [place] Access [place] patients after they have their name applied to their card.   Self-Pay (no insurance) in [facility]:  Organization         Address  Phone   Notes  Sickle Cell Patients, [facility] [place] [phone]   [facility] [place] [phone]   [facility]gent [place], [address], [place] [phone]   [facility]/Dr. Osei-Bonsu  [address] [place] [address],  [facility] or [place], [address], [place] [phone] Phone number for both [place] and [place] locations is the same.  Urgent Medical and [facility] [address], [place] [phone]   [facility] [address], [place] or [address] [phone]   [facility] [address], [place] [phone], phone; [phone], fax Sees patients 1st and 3rd Saturday of every month.  Must not qualify for public or private insurance (i.e. Medicaid, Medicare, [place] Health Choice, Veterans' Benefits)  Household income should be no more than 200% of the poverty level The clinic cannot treat you if you are pregnant or think you are pregnant  Sexually transmitted diseases are not treated at  the clinic.    Dental Care: Organization         Address  Phone  Notes  [facility] Department of [facility] [place] [phone] Accepts children up to age 92 who are enrolled in [place] or [place]; pregnant women with a Medicaid card; and children who have applied for Medicaid or [place] Health Choice, but were declined, whose parents can pay a reduced fee at time of service.  [facility] Department of [facility]  [address], [place] [phone] Accepts children up to age 38 who are enrolled in [place] or [place]; pregnant women with a Medicaid card; and children who have applied for Medicaid or [place] Health Choice, but were declined, whose parents can pay a reduced fee at time of service.  [place] Adult Dental Access PROGRAM  [place] [phone] Patients are seen by appointment only. Walk-ins are not accepted. [place] will see patients 47 years of age and older. Monday - Tuesday (8am-5pm) Most Wednesdays (8:30-5pm) $30 per visit, cash only  [facility] Adult Dental Access PROGRAM  [address], [facility] [phone] Patients are seen by appointment only. Walk-ins are not accepted. [place] will see patients 23 years of age and older. One Wednesday Evening (Monthly: Volunteer Based).  $30 per visit, cash only  [place]  [phone] for adults; Children under age 4, call Graduate Pediatric Dentistry at [phone]. Children aged 76-14, please call [phone] to request a pediatric application.  Dental services are provided in all areas of dental care including fillings, crowns and bridges, complete and partial dentures, implants, gum treatment, root canals, and extractions. Preventive care is also provided. Treatment is provided to both adults and children. Patients are selected via a lottery and there is often a  waiting list.   [facility] [address] [place]  [phone] [email or web address]   [facility] [address] [place], [place] [phone], Ext. [phone] Second and Fourth Thursday of each month, opens at 6:30 AM; Clinic ends at 9 AM.  Patients are seen on a first-come first-served basis, and a limited number are seen during each clinic.   [facility]  [address] [place]ard Danker [place], [place] [phone]   Eligibility Requirements You must have lived in [place], [place], or [place] counties for at least the last three months.   You cannot be eligible for state or federal sponsored [employer], including [employer], [place], or [employer].   You generally cannot be eligible for healthcare insurance through your employer.    How to apply:  Eligibility screenings are held every Tuesday and Wednesday afternoon from 1:00 pm until 4:00 pm. You do not need an appointment for the interview!  [facility] [address] [place], [place]   [place]  [place] Department  [place]  [phone]    Behavioral Health Resources in the Community: Intensive Outpatient Programs Organization         Address  Phone  Notes  [place]. [address] [place], [place] [phone]   [facility] Outpatient [address] [place], [place]   ADS: Alcohol & Drug Svcs [address], [place], [place]   [place] [address],  [place], [place] or [phone]   Substance Abuse Resources Organization         Address  Phone  Notes  Alcohol and Drug Services  [phone]   [place]  [phone]   [facility] [place]   Chinita Pester  [phone]   Residential & Outpatient Substance Abuse  Program  8565110552   Psychological Services Organization         Address  Phone  Notes  [facility]  [place]  [phone]   [facility] [address], [place] or [phone]    Mobile Crisis Teams Organization         Address  Phone  Notes  [facility], Mobile Crisis Care Unit  [phone]   [facility]  [address] [place], [place]   [place] [address] [place] [phone]    Self-Help/Support Groups Organization         Address  Phone             Notes  [place]. of [place] - variety of support groups  [place] Call for more information  Narcotics Anonymous (NA), [facility] [address], [employer] [place]  2 meetings at this location   Special educational needs teacher         Address  Phone  Notes  [facility] Residential Treatment [place],    [place]  [phone]   [facility]  [address] [place] 8[address]563, [place], [place]   [place], [place] [phone] Admissions: 8am-3pm M-F  Incentives Substance [place] [address]    [place], [place] [phone]   [facility] [address] [place], [place], [place]   [facility] [address],  [place], [place]   [facility] - Intensive Outpatient [address], [place], [place]   [facility] [place].,  [place], [place] [phone] or [phone]   [facility]idential Treatment Services (RTS) [address] [place], [place] Accepts Medicaid  [facility] [place] [address]  [place] [phone] Substance Abuse/Addiction Treatment   [facility] Organization          Address  Phone  Notes  [facility]  [phone]   Domenic Schwab, PhD [address] [place], [place]   [phone] or [phone]   [place], [place] [phone]   [place] [address], [place], [place] [phone] Insurance/Medicaid/sponsorship through [facility] and Families [address] [place], [place] [phone] Therapy/tele-psych/case  [facility] [place], [place] [phone]    Dr. Adele Schilder  [phone]   [facility] [facility] Dept. 1) [address] [place] 2) [place] 3)  [address] [address], [place] [phone]  [phone]   [place] [phone] or [phone] (After Hours)

## 2015-09-05 ENCOUNTER — Emergency Department (HOSPITAL_BASED_OUTPATIENT_CLINIC_OR_DEPARTMENT_OTHER): Payer: Self-pay

## 2015-09-05 ENCOUNTER — Encounter (HOSPITAL_BASED_OUTPATIENT_CLINIC_OR_DEPARTMENT_OTHER): Payer: Self-pay | Admitting: *Deleted

## 2015-09-05 ENCOUNTER — Emergency Department (HOSPITAL_BASED_OUTPATIENT_CLINIC_OR_DEPARTMENT_OTHER)
Admission: EM | Admit: 2015-09-05 | Discharge: 2015-09-05 | Disposition: A | Discharge status: Home / Self Care | Payer: Self-pay | Attending: Emergency Medicine | Admitting: Emergency Medicine

## 2015-09-05 DIAGNOSIS — K529 Noninfective gastroenteritis and colitis, unspecified: Secondary | ICD-10-CM | POA: Insufficient documentation

## 2015-09-05 DIAGNOSIS — Z3202 Encounter for pregnancy test, result negative: Secondary | ICD-10-CM | POA: Insufficient documentation

## 2015-09-05 DIAGNOSIS — Z79899 Other long term (current) drug therapy: Secondary | ICD-10-CM | POA: Insufficient documentation

## 2015-09-05 DIAGNOSIS — Z791 Long term (current) use of non-steroidal anti-inflammatories (NSAID): Secondary | ICD-10-CM | POA: Insufficient documentation

## 2015-09-05 DIAGNOSIS — D259 Leiomyoma of uterus, unspecified: Secondary | ICD-10-CM | POA: Insufficient documentation

## 2015-09-05 LAB — CBC WITH DIFFERENTIAL/PLATELET
Basophils Absolute: 0 10*3/uL (ref 0.0–0.1)
Basophils Relative: 0 %
Eosinophils Absolute: 0 10*3/uL (ref 0.0–0.7)
Eosinophils Relative: 0 %
HEMATOCRIT: 43.5 % (ref 36.0–46.0)
Hemoglobin: 14.3 g/dL (ref 12.0–15.0)
LYMPHS PCT: 4 %
Lymphs Abs: 0.3 10*3/uL — ABNORMAL LOW (ref 0.7–4.0)
MCH: 27.5 pg (ref 26.0–34.0)
MCHC: 32.9 g/dL (ref 30.0–36.0)
MCV: 83.7 fL (ref 78.0–100.0)
MONO ABS: 0.4 10*3/uL (ref 0.1–1.0)
MONOS PCT: 4 %
NEUTROS ABS: 8.7 10*3/uL — AB (ref 1.7–7.7)
Neutrophils Relative %: 92 %
Platelets: 369 10*3/uL (ref 150–400)
RBC: 5.2 MIL/uL — ABNORMAL HIGH (ref 3.87–5.11)
RDW: 13 % (ref 11.5–15.5)
WBC: 9.4 10*3/uL (ref 4.0–10.5)

## 2015-09-05 LAB — COMPREHENSIVE METABOLIC PANEL
ALBUMIN: 3.9 g/dL (ref 3.5–5.0)
ALT: 15 U/L (ref 14–54)
AST: 21 U/L (ref 15–41)
Alkaline Phosphatase: 88 U/L (ref 38–126)
Anion gap: 4 — ABNORMAL LOW (ref 5–15)
BILIRUBIN TOTAL: 0.6 mg/dL (ref 0.3–1.2)
BUN: 10 mg/dL (ref 6–20)
CHLORIDE: 108 mmol/L (ref 101–111)
CO2: 24 mmol/L (ref 22–32)
Calcium: 9.3 mg/dL (ref 8.9–10.3)
Creatinine, Ser: 0.9 mg/dL (ref 0.44–1.00)
GFR calc Af Amer: >60 mL/min (ref >60)
GFR calc non Af Amer: >60 mL/min (ref >60)
GLUCOSE: 148 mg/dL — AB (ref 65–99)
POTASSIUM: 3.8 mmol/L (ref 3.5–5.1)
Sodium: 136 mmol/L (ref 135–145)
Total Protein: 8.2 g/dL — ABNORMAL HIGH (ref 6.5–8.1)

## 2015-09-05 LAB — HCG, SERUM, QUALITATIVE: PREG SERUM: NEGATIVE

## 2015-09-05 LAB — LIPASE, BLOOD: Lipase: 22 U/L (ref 11–51)

## 2015-09-05 MED ORDER — ONDANSETRON HCL 4 MG/2ML IJ SOLN
4.0000 mg | Freq: Once | INTRAMUSCULAR | Status: AC
  Administered 2015-09-05: 4 mg via INTRAVENOUS
  Filled 2015-09-05: qty 2

## 2015-09-05 MED ORDER — IOHEXOL 300 MG/ML  SOLN
100.0000 mL | Freq: Once | INTRAMUSCULAR | Status: AC | PRN
  Administered 2015-09-05: 100 mL via INTRAVENOUS

## 2015-09-05 MED ORDER — IOHEXOL 300 MG/ML  SOLN
25.0000 mL | Freq: Once | INTRAMUSCULAR | Status: AC | PRN
  Administered 2015-09-05: 25 mL via ORAL

## 2015-09-05 MED ORDER — SODIUM CHLORIDE 0.9 % IV BOLUS (SEPSIS)
1000.0000 mL | Freq: Once | INTRAVENOUS | Status: AC
  Administered 2015-09-05: 1000 mL via INTRAVENOUS

## 2015-09-05 MED ORDER — SODIUM CHLORIDE 0.9 % IV SOLN
INTRAVENOUS | Status: DC
  Administered 2015-09-05: 11:00:00 via INTRAVENOUS

## 2015-09-05 MED ORDER — FENTANYL CITRATE (PF) 100 MCG/2ML IJ SOLN
25.0000 ug | Freq: Once | INTRAMUSCULAR | Status: AC
  Administered 2015-09-05: 25 ug via INTRAVENOUS
  Filled 2015-09-05: qty 2

## 2015-09-05 MED ORDER — LOPERAMIDE HCL 2 MG PO TABS
2.0000 mg | ORAL_TABLET | Freq: Four times a day (QID) | ORAL | Status: DC | PRN
Start: 2015-09-05 — End: 2016-01-06

## 2015-09-05 MED ORDER — PROMETHAZINE HCL 25 MG PO TABS: 25.0000 mg | ORAL_TABLET | Freq: Four times a day (QID) | ORAL | Status: DC | PRN

## 2015-09-05 NOTE — ED Notes (Signed)
Patient transported to CT 

## 2015-09-05 NOTE — ED Notes (Signed)
Pt continues to ask for ice chips and water. PT instructed that Doctor has talked with her about nothing by mouth until tests come back.

## 2015-09-05 NOTE — Discharge Instructions (Signed)
Food Choices to Help Relieve Diarrhea, Adult When you have diarrhea, the foods you eat and your eating habits are very important. Choosing the right foods and drinks can help relieve diarrhea. Also, because diarrhea can last up to 7 days, you need to replace lost fluids and electrolytes (such as sodium, potassium, and chloride) in order to help prevent dehydration.  WHAT GENERAL GUIDELINES DO I NEED TO FOLLOW?  Slowly drink 1 cup (8 oz) of fluid for each episode of diarrhea. If you are getting enough fluid, your urine will be clear or pale yellow.  Eat starchy foods. Some good choices include white rice, white toast, pasta, low-fiber cereal, baked potatoes (without the skin), saltine crackers, and bagels.  Avoid large servings of any cooked vegetables.  Limit fruit to two servings per day. A serving is  cup or 1 small piece.  Choose foods with less than 2 g of fiber per serving.  Limit fats to less than 8 tsp (38 g) per day.  Avoid fried foods.  Eat foods that have probiotics in them. Probiotics can be found in certain dairy products.  Avoid foods and beverages that may increase the speed at which food moves through the stomach and intestines (gastrointestinal tract). Things to avoid include:  High-fiber foods, such as dried fruit, raw fruits and vegetables, nuts, seeds, and whole grain foods.  Spicy foods and high-fat foods.  Foods and beverages sweetened with high-fructose corn syrup, honey, or sugar alcohols such as xylitol, sorbitol, and mannitol. WHAT FOODS ARE RECOMMENDED? Grains White rice. White, [place], or pita breads (fresh or toasted), including plain rolls, buns, or bagels. White pasta. Saltine, soda, or graham crackers. Pretzels. Low-fiber cereal. Cooked cereals made with water (such as cornmeal, farina, or cream cereals). Plain muffins. Matzo. Melba toast. Zwieback.  Vegetables Potatoes (without the skin). Strained tomato and vegetable juices. Most well-cooked and canned  vegetables without seeds. Tender lettuce. Fruits Cooked or canned applesauce, apricots, cherries, fruit cocktail, grapefruit, peaches, pears, or plums. Fresh bananas, apples without skin, cherries, grapes, cantaloupe, grapefruit, peaches, oranges, or plums.  Meat and Other Protein Products Baked or boiled chicken. Eggs. Tofu. Fish. Seafood. Smooth peanut butter. Ground or well-cooked tender beef, ham, veal, lamb, pork, or poultry.  Dairy Plain yogurt, kefir, and unsweetened liquid yogurt. Lactose-free milk, buttermilk, or soy milk. Plain hard cheese. Beverages Sport drinks. Clear broths. Diluted fruit juices (except prune). Regular, caffeine-free sodas such as ginger ale. Water. Decaffeinated teas. Oral rehydration solutions. Sugar-free beverages not sweetened with sugar alcohols. Other Bouillon, broth, or soups made from recommended foods.  The items listed above may not be a complete list of recommended foods or beverages. Contact your dietitian for more options. WHAT FOODS ARE NOT RECOMMENDED? Grains Whole grain, whole wheat, bran, or rye breads, rolls, pastas, crackers, and cereals. Wild or brown rice. Cereals that contain more than 2 g of fiber per serving. Corn tortillas or taco shells. Cooked or dry oatmeal. Granola. Popcorn. Vegetables Raw vegetables. Cabbage, broccoli, Brussels sprouts, artichokes, baked beans, beet greens, corn, kale, legumes, peas, sweet potatoes, and yams. Potato skins. Cooked spinach and cabbage. Fruits Dried fruit, including raisins and dates. Raw fruits. Stewed or dried prunes. Fresh apples with skin, apricots, mangoes, pears, raspberries, and strawberries.  Meat and Other Protein Products Chunky peanut butter. Nuts and seeds. Beans and lentils. Berniece Salines.  Dairy High-fat cheeses. Milk, chocolate milk, and beverages made with milk, such as milk shakes. Cream. Ice cream. Sweets and Desserts Sweet rolls, doughnuts, and sweet breads.  Pancakes and waffles. Fats and  Oils Butter. Cream sauces. Margarine. Salad oils. Plain salad dressings. Olives. Avocados.  Beverages Caffeinated beverages (such as coffee, tea, soda, or energy drinks). Alcoholic beverages. Fruit juices with pulp. Prune juice. Soft drinks sweetened with high-fructose corn syrup or sugar alcohols. Other Coconut. Hot sauce. Chili powder. Mayonnaise. Gravy. Cream-based or milk-based soups.  The items listed above may not be a complete list of foods and beverages to avoid. Contact your dietitian for more information. WHAT SHOULD I DO IF I BECOME DEHYDRATED? Diarrhea can sometimes lead to dehydration. Signs of dehydration include dark urine and dry mouth and skin. If you think you are dehydrated, you should rehydrate with an oral rehydration solution. These solutions can be purchased at pharmacies, retail stores, or online.  Drink -1 cup (120-240 mL) of oral rehydration solution each time you have an episode of diarrhea. If drinking this amount makes your diarrhea worse, try drinking smaller amounts more often. For example, drink 1-3 tsp (5-15 mL) every 5-10 minutes.  A general rule for staying hydrated is to drink 1-2 L of fluid per day. Talk to your health care provider about the specific amount you should be drinking each day. Drink enough fluids to keep your urine clear or pale yellow.   This information is not intended to replace advice given to you by your health care provider. Make sure you discuss any questions you have with your health care provider.  Patient with CT showing probably uterine fibroids you will need to follow-up with OB/GYN at some point in time. For the nausea vomiting and diarrhea take the Phenergan as needed for the nausea and vomiting. Take Imodium as needed for the diarrhea. Recommend small amounts of fluids with sugar until tolerated well then bland diet.   Document Released: 11/19/2003 Document Revised: 09/19/2014 Document Reviewed: 07/22/2013 Elsevier Interactive  Patient Education [employer].

## 2015-09-05 NOTE — ED Notes (Signed)
C/o lower all across abdominal pain with d/v since yesterday. No vaginal discharge. No problems with urination.

## 2015-09-05 NOTE — ED Notes (Signed)
Pt back from CT

## 2015-09-05 NOTE — ED Notes (Signed)
Pt repeatedly asks for water during assessment. I explained to pt that the doctor would have to see her first and that if something surgical she needed to wait for tests results. Pt continues to ask for something to drink.

## 2015-09-05 NOTE — ED Provider Notes (Signed)
CSN: [id number]     Arrival date & time 09/05/15  4782 History   First MD Initiated Contact with Patient 09/05/15 [phone]     No chief complaint on file.    (Consider location/radiation/quality/duration/timing/severity/associated sxs/prior Treatment) The history is provided by the patient.   34 year old female with past history of Crohn's disease. Acute onset around 7:30 last evening of nausea vomiting and diarrhea. No blood in the abdomen. Also associated with abdominal pain generalized throughout crampy in nature. Patient states that the abdominal pain is 6 out of 10. No known sick contacts.  Past Medical History  Diagnosis Date  . Crohn disease [facility])    Past Surgical History  Procedure Laterality Date  . Colon surgery    . Eye surgery     No family history on file. Social History  Substance Use Topics  . Smoking status: Never Smoker   . Smokeless tobacco: Never Used  . Alcohol Use: No     Comment: occ   OB History    No data available     Review of Systems  Constitutional: Negative for fever.  HENT: Negative for congestion.   Eyes: Negative for redness.  Respiratory: Negative for shortness of breath.   Cardiovascular: Negative for chest pain.  Gastrointestinal: Positive for nausea, vomiting, abdominal pain and diarrhea.  Genitourinary: Negative for dysuria.  Musculoskeletal: Negative for myalgias and back pain.  Skin: Negative for rash.  Neurological: Negative for headaches.  Hematological: Does not bruise/bleed easily.  Psychiatric/Behavioral: Negative for confusion.      Allergies  Review of patient's allergies indicates no known allergies.  Home Medications   Prior to Admission medications   Medication Sig Start Date End Date Taking? Authorizing Provider  ibuprofen (ADVIL,MOTRIN) 800 MG tablet Take 1 tablet (800 mg total) by mouth 3 (three) times daily. 05/09/15  Yes Hannah Muthersbaugh, PA-C  Multiple Vitamin (MULTIVITAMIN WITH MINERALS) TABS tablet Take 1  tablet by mouth daily.   Yes Historical Provider, MD  loperamide (IMODIUM A-D) 2 MG tablet Take 1 tablet (2 mg total) by mouth 4 (four) times daily as needed for diarrhea or loose stools. 09/05/15   Fredia Sorrow, MD  promethazine (PHENERGAN) 25 MG tablet Take 1 tablet (25 mg total) by mouth every 6 (six) hours as needed for nausea or vomiting. 09/05/15   Fredia Sorrow, MD   BP 120/71 mmHg  Pulse 89  Temp(Src) 98.1 F (36.7 C) (Oral)  Resp 18  Wt 91.173 kg  SpO2 99%  LMP 09/05/2015 Physical Exam  Constitutional: She is oriented to person, place, and time. She appears well-developed and well-nourished. No distress.  HENT:  Head: Normocephalic and atraumatic.  Because membranes dry.  Eyes: Conjunctivae and EOM are normal. Pupils are equal, round, and reactive to light.  Neck: Normal range of motion. Neck supple.  Cardiovascular: Normal rate, regular rhythm and normal heart sounds.   No murmur heard. Pulmonary/Chest: Effort normal.  Abdominal: Soft. Bowel sounds are normal. There is tenderness.  Mild generalized tenderness throughout the abdomen. No guarding.  Musculoskeletal: Normal range of motion.  Neurological: She is alert and oriented to person, place, and time. No cranial nerve deficit. She exhibits normal muscle tone. Coordination normal.  Skin: Skin is warm. No rash noted. No erythema.  Nursing note and vitals reviewed.   ED Course  Procedures (including critical care time) Labs Review Labs Reviewed  COMPREHENSIVE METABOLIC PANEL - Abnormal; Notable for the following:    Glucose, Bld 148 (*)    Total  Protein 8.2 (*)    Anion gap 4 (*)    All other components within normal limits  CBC WITH DIFFERENTIAL/PLATELET - Abnormal; Notable for the following:    RBC 5.20 (*)    Neutro Abs 8.7 (*)    Lymphs Abs 0.3 (*)    All other components within normal limits  LIPASE, BLOOD  HCG, SERUM, QUALITATIVE   Results for orders placed or performed during the hospital encounter  of 09/05/15  Comprehensive metabolic panel  Result Value Ref Range   Sodium 136 135 - 145 mmol/L   Potassium 3.8 3.5 - 5.1 mmol/L   Chloride 108 101 - 111 mmol/L   CO2 24 22 - 32 mmol/L   Glucose, Bld 148 (H) 65 - 99 mg/dL   BUN 10 6 - 20 mg/dL   Creatinine, Ser 0.90 0.44 - 1.00 mg/dL   Calcium 9.3 8.9 - 10.3 mg/dL   Total Protein 8.2 (H) 6.5 - 8.1 g/dL   Albumin 3.9 3.5 - 5.0 g/dL   AST 21 15 - 41 U/L   ALT 15 14 - 54 U/L   Alkaline Phosphatase 88 38 - 126 U/L   Total Bilirubin 0.6 0.3 - 1.2 mg/dL   GFR calc non Af Amer >60 >60 mL/min   GFR calc Af Amer >60 >60 mL/min   Anion gap 4 (L) 5 - 15  Lipase, blood  Result Value Ref Range   Lipase 22 11 - 51 U/L  CBC with Differential/Platelet  Result Value Ref Range   WBC 9.4 4.0 - 10.5 K/uL   RBC 5.20 (H) 3.87 - 5.11 MIL/uL   Hemoglobin 14.3 12.0 - 15.0 g/dL   HCT 43.5 36.0 - 46.0 %   MCV 83.7 78.0 - 100.0 fL   MCH 27.5 26.0 - 34.0 pg   MCHC 32.9 30.0 - 36.0 g/dL   RDW 13.0 11.5 - 15.5 %   Platelets 369 150 - 400 K/uL   Neutrophils Relative % 92 %   Neutro Abs 8.7 (H) 1.7 - 7.7 K/uL   Lymphocytes Relative 4 %   Lymphs Abs 0.3 (L) 0.7 - 4.0 K/uL   Monocytes Relative 4 %   Monocytes Absolute 0.4 0.1 - 1.0 K/uL   Eosinophils Relative 0 %   Eosinophils Absolute 0.0 0.0 - 0.7 K/uL   Basophils Relative 0 %   Basophils Absolute 0.0 0.0 - 0.1 K/uL  hCG, serum, qualitative  Result Value Ref Range   Preg, Serum NEGATIVE NEGATIVE     Imaging Review Ct Abdomen Pelvis W Contrast  09/05/2015  CLINICAL DATA:  Lower abdominal pain with nausea vomiting and diarrhea. No chills. EXAM: CT ABDOMEN AND PELVIS WITH CONTRAST TECHNIQUE: Multidetector CT imaging of the abdomen and pelvis was performed using the standard protocol following bolus administration of intravenous contrast. CONTRAST:  176m OMNIPAQUE IOHEXOL 300 MG/ML SOLN, 25mOMNIPAQUE IOHEXOL 300 MG/ML SOLN COMPARISON:  CT 05/18/2008, 08/02/2007 FINDINGS: Lower chest: Lung bases  are clear. Hepatobiliary: No focal hepatic lesion. No biliary duct dilatation. Gallbladder is normal. Common bile duct is normal. Pancreas: Pancreas is normal. No ductal dilatation. No pancreatic inflammation. Spleen: Normal spleen Adrenals/urinary tract: Adrenal glands and kidneys are normal. The ureters and bladder normal. Stomach/Bowel: Stomach, small bowel, appendix, and cecum are normal. The colon and rectosigmoid colon are normal. Vascular/Lymphatic: Abdominal aorta is normal caliber. There is no retroperitoneal or periportal lymphadenopathy. No pelvic lymphadenopathy. Reproductive: Uterus is enlarged measuring 21 cm in craniocaudad dimension and 14 x 13 cm in  axial dimension. There are multiple round lesions within the uterine body. Dominant lesion in the fundus measures 7.8 cm with heterogeneous enhancement. Lesion in the lower uterine segment is uniformly enhancing measure 7.2 cm. Exophytic lesion from the LEFT aspect measures 4.6 cm. In comparison to CT of 2009 the uterus has enlarged significantly in volume measuring 21 cm in craniocaudad dimension compared to 8 point 6 cm. Uterus extends superior to the umbilicus. Other: No free fluid. Musculoskeletal: No aggressive osseous lesion. IMPRESSION: Considerable expansion of the uterus compared to CT of 05/18/2008. There are multiple round enhancing masses within the myometrium consistent with enlarging leiomyomas. The uterus extends superior to the umbilicus. Recommend non emergent GYN consultation. Electronically Signed   By: Suzy Bouchard M.D.   On: 09/05/2015 09:52   I have personally reviewed and evaluated these images and lab results as part of my medical decision-making.   EKG Interpretation None      MDM   Final diagnoses:  Gastroenteritis  Uterine fibroids affecting pregnancy, unspecified trimester   Labs without any significant abnormalities CT scan without any acute findings other than enlarged uterus most likely related to uterine  fibroids. Patient will follow-up with OB/GYN on a regular basis for this. Patient will be treated as an acute viral gastroenteritis with Phenergan and Imodium right ear. No evidence of any Crohn's disease on the CT scan. The patient was significant improvement with 2 L of fluid here.      Fredia Sorrow, MD 09/05/15 1055

## 2016-01-06 ENCOUNTER — Emergency Department (HOSPITAL_COMMUNITY)
Admission: EM | Admit: 2016-01-06 | Discharge: 2016-01-06 | Payer: BLUE CROSS/BLUE SHIELD | Attending: Emergency Medicine | Admitting: Emergency Medicine

## 2016-01-06 ENCOUNTER — Encounter (HOSPITAL_COMMUNITY): Payer: Self-pay

## 2016-01-06 ENCOUNTER — Inpatient Hospital Stay
Admission: EM | Admit: 2016-01-06 | Discharge: 2016-01-08 | DRG: 885 | Disposition: A | Discharge status: Home / Self Care | Payer: BLUE CROSS/BLUE SHIELD | Source: Intra-hospital | Attending: Psychiatry | Admitting: Psychiatry

## 2016-01-06 ENCOUNTER — Encounter: Payer: Self-pay | Admitting: Psychiatry

## 2016-01-06 DIAGNOSIS — G4700 Insomnia, unspecified: Secondary | ICD-10-CM | POA: Diagnosis present

## 2016-01-06 DIAGNOSIS — Z9889 Other specified postprocedural states: Secondary | ICD-10-CM

## 2016-01-06 DIAGNOSIS — Z791 Long term (current) use of non-steroidal anti-inflammatories (NSAID): Secondary | ICD-10-CM | POA: Diagnosis not present

## 2016-01-06 DIAGNOSIS — Z8719 Personal history of other diseases of the digestive system: Secondary | ICD-10-CM | POA: Insufficient documentation

## 2016-01-06 DIAGNOSIS — Z046 Encounter for general psychiatric examination, requested by authority: Secondary | ICD-10-CM | POA: Diagnosis present

## 2016-01-06 DIAGNOSIS — Z3202 Encounter for pregnancy test, result negative: Secondary | ICD-10-CM | POA: Insufficient documentation

## 2016-01-06 DIAGNOSIS — F29 Unspecified psychosis not due to a substance or known physiological condition: Secondary | ICD-10-CM | POA: Diagnosis not present

## 2016-01-06 DIAGNOSIS — K5090 Crohn's disease, unspecified, without complications: Secondary | ICD-10-CM | POA: Diagnosis present

## 2016-01-06 DIAGNOSIS — Z79899 Other long term (current) drug therapy: Secondary | ICD-10-CM | POA: Insufficient documentation

## 2016-01-06 DIAGNOSIS — F22 Delusional disorders: Secondary | ICD-10-CM | POA: Diagnosis present

## 2016-01-06 LAB — CBC
HEMATOCRIT: 40.9 % (ref 36.0–46.0)
HEMOGLOBIN: 13.6 g/dL (ref 12.0–15.0)
MCH: 28.1 pg (ref 26.0–34.0)
MCHC: 33.3 g/dL (ref 30.0–36.0)
MCV: 84.5 fL (ref 78.0–100.0)
Platelets: 382 10*3/uL (ref 150–400)
RBC: 4.84 MIL/uL (ref 3.87–5.11)
RDW: 14.1 % (ref 11.5–15.5)
WBC: 7.8 10*3/uL (ref 4.0–10.5)

## 2016-01-06 LAB — COMPREHENSIVE METABOLIC PANEL
ALBUMIN: 3.9 g/dL (ref 3.5–5.0)
ALT: 19 U/L (ref 14–54)
ANION GAP: 7 (ref 5–15)
AST: 30 U/L (ref 15–41)
Alkaline Phosphatase: 91 U/L (ref 38–126)
BUN: 7 mg/dL (ref 6–20)
CALCIUM: 9.6 mg/dL (ref 8.9–10.3)
CO2: 24 mmol/L (ref 22–32)
Chloride: 106 mmol/L (ref 101–111)
Creatinine, Ser: 0.86 mg/dL (ref 0.44–1.00)
GFR calc non Af Amer: >60 mL/min (ref >60)
GLUCOSE: 107 mg/dL — AB (ref 65–99)
POTASSIUM: 3.8 mmol/L (ref 3.5–5.1)
SODIUM: 137 mmol/L (ref 135–145)
Total Bilirubin: <0.1 mg/dL — ABNORMAL LOW (ref 0.3–1.2)
Total Protein: 7.8 g/dL (ref 6.5–8.1)

## 2016-01-06 LAB — SALICYLATE LEVEL

## 2016-01-06 LAB — ETHANOL: Alcohol, Ethyl (B): <5 mg/dL (ref <5)

## 2016-01-06 LAB — I-STAT BETA HCG BLOOD, ED (MC, WL, AP ONLY)

## 2016-01-06 LAB — ACETAMINOPHEN LEVEL

## 2016-01-06 MED ORDER — ALUM & MAG HYDROXIDE-SIMETH 200-200-20 MG/5ML PO SUSP: 30.0000 mL | ORAL | Status: DC | PRN

## 2016-01-06 MED ORDER — ONDANSETRON HCL 4 MG PO TABS: 4.0000 mg | ORAL_TABLET | Freq: Three times a day (TID) | ORAL | Status: DC | PRN

## 2016-01-06 MED ORDER — TRAZODONE HCL 100 MG PO TABS
100.0000 mg | ORAL_TABLET | Freq: Every day | ORAL | Status: DC
Start: 2016-01-06 — End: 2016-01-08
  Administered 2016-01-06 – 2016-01-07 (×2): 100 mg via ORAL
  Filled 2016-01-06 (×2): qty 1

## 2016-01-06 MED ORDER — TRAZODONE HCL 50 MG PO TABS: 50.0000 mg | ORAL_TABLET | Freq: Every day | ORAL | Status: DC

## 2016-01-06 MED ORDER — PRENATAL PLUS 27-1 MG PO TABS
1.0000 | ORAL_TABLET | Freq: Every day | ORAL | Status: DC
  Administered 2016-01-07 – 2016-01-08 (×2): 1 via ORAL
  Filled 2016-01-06 (×3): qty 1

## 2016-01-06 MED ORDER — DIPHENHYDRAMINE HCL 25 MG PO CAPS
50.0000 mg | ORAL_CAPSULE | Freq: Once | ORAL | Status: DC
Start: 2016-01-06 — End: 2016-01-06

## 2016-01-06 MED ORDER — ACETAMINOPHEN 325 MG PO TABS: 650.0000 mg | ORAL_TABLET | Freq: Four times a day (QID) | ORAL | Status: DC | PRN

## 2016-01-06 MED ORDER — MAGNESIUM HYDROXIDE 400 MG/5ML PO SUSP: 30.0000 mL | Freq: Every day | ORAL | Status: DC | PRN

## 2016-01-06 MED ORDER — LORAZEPAM 2 MG PO TABS: 2.0000 mg | ORAL_TABLET | Freq: Four times a day (QID) | ORAL | Status: DC | PRN

## 2016-01-06 MED ORDER — RISPERIDONE 1 MG PO TABS
2.0000 mg | ORAL_TABLET | Freq: Two times a day (BID) | ORAL | Status: DC
  Administered 2016-01-06 – 2016-01-07 (×2): 2 mg via ORAL
  Filled 2016-01-06 (×2): qty 2

## 2016-01-06 MED ORDER — ACETAMINOPHEN 325 MG PO TABS: 650.0000 mg | ORAL_TABLET | ORAL | Status: DC | PRN

## 2016-01-06 MED ORDER — BENZTROPINE MESYLATE 1 MG PO TABS: 0.5000 mg | ORAL_TABLET | Freq: Two times a day (BID) | ORAL | Status: DC

## 2016-01-06 MED ORDER — HALOPERIDOL 5 MG PO TABS: 5.0000 mg | ORAL_TABLET | Freq: Once | ORAL | Status: DC

## 2016-01-06 MED ORDER — LORAZEPAM 1 MG PO TABS: 1.0000 mg | ORAL_TABLET | Freq: Three times a day (TID) | ORAL | Status: DC | PRN

## 2016-01-06 MED ORDER — HALOPERIDOL 2 MG PO TABS: 2.0000 mg | ORAL_TABLET | Freq: Two times a day (BID) | ORAL | Status: DC

## 2016-01-06 MED ORDER — ZOLPIDEM TARTRATE 5 MG PO TABS: 5.0000 mg | ORAL_TABLET | Freq: Every evening | ORAL | Status: DC | PRN

## 2016-01-06 MED ORDER — NICOTINE 21 MG/24HR TD PT24: 21.0000 mg | MEDICATED_PATCH | Freq: Every day | TRANSDERMAL | Status: DC

## 2016-01-06 MED ORDER — IBUPROFEN 200 MG PO TABS: 600.0000 mg | ORAL_TABLET | Freq: Three times a day (TID) | ORAL | Status: DC | PRN

## 2016-01-06 NOTE — Progress Notes (Signed)
Pt admitted to unit. Skin and contraband search completed and witnessed by [facility], Therapist, sports. No contraband found, no skin issues noted.

## 2016-01-06 NOTE — ED Notes (Signed)
Sheriff on unit to transport pt to [facility] per MD order. Pt signed for personal belongings. Personal belongings given to sheriff for transport. Pt ambulatory off unit with sheriff.

## 2016-01-06 NOTE — ED Notes (Signed)
Pt refusing to answer questions with this nurse.Poor eye contact. "I'm not staying back here by myself." Pt presents with paranoia. "I had the police bring me here for safety." Pt reports "I need my clothes to go." Pt demanding to leave. Reginold Agent, FNP, and Agricultural consultant notified. Special checks q 15 mins in place for safety. Video monitoring in place.

## 2016-01-06 NOTE — ED Notes (Signed)
Bed: WGY65 Expected date:  Expected time:  Means of arrival:  Comments: Triage 4

## 2016-01-06 NOTE — BH Assessment (Signed)
Assessment Note  Vicki Lee is an 35 y.o. female. She presents to [facility] with no psychiatric history. Patient stating that she lives alone in an apartment. She is single with not kids. Patient is unemployed. She reports calling [employer] today for assistance with finding new housing. Sts that she is being harassed by the sound effects of music and television in her home. Despite patients psychotic symptoms she denies AVH's. Patient denies suicidal ideations. She denies history of suicide attempts. No self mutilating behaviors. No family history of mental health illness. Denies depression and anxiety. Appetite is good. Patient stating, "I need to leave so I can get some food because I am starving". Patient denies homicidal thoughts. No history of harm to others. No legal issues reported. Patient is currently uncooperative and doesn't want to complete assessment. Sts, "I want to leave and I only came here because I need help with housing". Patient angry and frustrated during the assessment. Patient denies having an outpatient therapist/psychiatrist. Denies alcohol and drug use reported. No history of abuse. No support system reported.   Diagnosis: Psychotic Disorder NOS  Past Medical History:  Past Medical History  Diagnosis Date  . Crohn disease [name])     Past Surgical History  Procedure Laterality Date  . Colon surgery    . Eye surgery      Family History: No family history on file.  Social History:  reports that she has never smoked. She has never used smokeless tobacco. She reports that she does not drink alcohol or use illicit drugs.  Additional Social History:  Alcohol / Drug Use Pain Medications: SEE MAR Prescriptions: SEE MAR Over the Counter: SEE MAR History of alcohol / drug use?: No history of alcohol / drug abuse  CIWA: CIWA-Ar BP: 135/93 mmHg Pulse Rate: 94 COWS:    Allergies: No Known Allergies  Home Medications:  (Not in a hospital admission)  OB/GYN Status:  No LMP  recorded (lmp unknown).  General Assessment Data [facility] of Assessment: [facility] TTS Assessment: In system Is this a Tele or Face-to-Face Assessment?: Face-to-Face Is this an Initial Assessment or a Re-assessment for this encounter?: Initial Assessment Marital status: Single Maiden name:  (n/a) Is patient pregnant?: No Pregnancy Status: No Living Arrangements: Alone Can pt return to current living arrangement?: Yes Admission Status: Voluntary Is patient capable of signing voluntary admission?: Yes Referral Source: Self/Family/Friend Insurance type:  (self pay)  Medical Screening Exam ([place]) Medical Exam completed: No Reason for MSE not completed:  (n/a)  Crisis Care Plan Living Arrangements: Alone Legal Guardian:  (patient denies) Name of Psychiatrist:  (no psychiatrist ) Name of Therapist:  (no therapist )  Education Status Is patient currently in school?: No Current Grade:  (n/a) Highest grade of school patient has completed:  (unk) Name of school:  (n/a) Contact person:  (n/a)  Risk to self with the past 6 months Suicidal Ideation: No Has patient been a risk to self within the past 6 months prior to admission? : No Suicidal Intent: No Has patient had any suicidal intent within the past 6 months prior to admission? : No Is patient at risk for suicide?: No Suicidal Plan?: No Has patient had any suicidal plan within the past 6 months prior to admission? : No Access to Means: No What has been your use of drugs/alcohol within the last 12 months?:  (n/a) Previous Attempts/Gestures: No How many times?:  (n/a) Other Self Harm Risks:  (n/a) Triggers for Past Attempts:  (patient  denies ) Intentional Self Injurious Behavior: None Family Suicide History: No Recent stressful life event(s): Other (Comment) ("The sound effects from the music and tv are harrassing me") Persecutory voices/beliefs?: No Depression: Yes Depression Symptoms: Feeling angry/irritable,  Feeling worthless/self pity, Loss of interest in usual pleasures, Guilt, Fatigue, Isolating, Tearfulness, Insomnia, Despondent Substance abuse history and/or treatment for substance abuse?: No Suicide prevention information given to non-admitted patients: Not applicable  Risk to Others within the past 6 months Homicidal Ideation: No Does patient have any lifetime risk of violence toward others beyond the six months prior to admission? : No Thoughts of Harm to Others: No Current Homicidal Intent: No Current Homicidal Plan: No Access to Homicidal Means: No Identified Victim:  (n/a) History of harm to others?: No Assessment of Violence: None Noted Violent Behavior Description:  (patient is calm and cooperative) Does patient have access to weapons?: No Criminal Charges Pending?: No Does patient have a court date: No Is patient on probation?: No  Psychosis Hallucinations: Auditory, Visual (denies AVH's; reports sound effects on radio and tv) Delusions: Unspecified  Mental Status Report Appearance/Hygiene: Disheveled Eye Contact: Good Motor Activity: Freedom of movement Speech: Logical/coherent Level of Consciousness: Alert Mood: Depressed Affect: Appropriate to circumstance Anxiety Level: None Thought Processes: Coherent, Relevant Judgement: Impaired Orientation: Person, Place, Time, Situation Obsessive Compulsive Thoughts/Behaviors: None  Cognitive Functioning Concentration: Decreased Memory: Recent Intact, Remote Intact IQ: Average Insight: Poor Impulse Control: Poor Appetite: Good Weight Loss:  (denies ) Weight Gain:  (denies) Sleep: Decreased Total Hours of Sleep:  (varies) Vegetative Symptoms: None  ADLScreening [facility] Assessment Services) Patient's cognitive ability adequate to safely complete daily activities?: Yes Patient able to express need for assistance with ADLs?: Yes Independently performs ADLs?: Yes (appropriate for developmental age)  Prior Inpatient  Therapy Prior Inpatient Therapy: No Prior Therapy Dates:  (n/a) Prior Therapy Facilty/Provider(s):  (n/a) Reason for Treatment:  (n/a)  Prior Outpatient Therapy Prior Outpatient Therapy: No Prior Therapy Dates:  (n/a) Prior Therapy Facilty/Provider(s):  (n/a) Reason for Treatment:  (n/a) Does patient have an ACCT team?: No Does patient have Intensive In-House Services?  : No Does patient have [facility] services? : No Does patient have P4CC services?: No  ADL Screening (condition at time of admission) Patient's cognitive ability adequate to safely complete daily activities?: Yes Is the patient deaf or have difficulty hearing?: No Does the patient have difficulty seeing, even when wearing glasses/contacts?: No Does the patient have difficulty concentrating, remembering, or making decisions?: Yes Patient able to express need for assistance with ADLs?: Yes Does the patient have difficulty dressing or bathing?: No Independently performs ADLs?: Yes (appropriate for developmental age) Does the patient have difficulty walking or climbing stairs?: No Weakness of Legs: None Weakness of Arms/Hands: None  Home Assistive Devices/Equipment Home Assistive Devices/Equipment: None    Abuse/Neglect Assessment (Assessment to be complete while patient is alone) Physical Abuse: Denies Verbal Abuse: Denies Sexual Abuse: Denies Exploitation of patient/patient's resources: Denies Self-Neglect: Denies Values / Beliefs Cultural Requests During Hospitalization: None Spiritual Requests During Hospitalization: None   Advance Directives (For Healthcare) Does patient have an advance directive?: No Would patient like information on creating an advanced directive?: No - patient declined information Nutrition Screen- [facility] Adult/[facility] Patient's home diet: Regular  Additional Information 1:1 In Past 12 Months?: No CIRT Risk: No Elopement Risk: No Does patient have medical clearance?: No      Disposition:  Disposition Initial Assessment Completed for this Encounter: Yes Disposition of Patient: Inpatient treatment program Reginold Agent, NP recommends  INPT treatment)  On Site Evaluation by:   Reviewed with Physician:    Waldon Merl [facility] 01/06/2016 1:16 PM
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
## 2016-01-06 NOTE — ED Provider Notes (Signed)
CSN: [id number]     Arrival date & time 01/06/16  0715 History   First MD Initiated Contact with Patient 01/06/16 [phone]     Chief Complaint  Patient presents with  . Medical Clearance     (Consider location/radiation/quality/duration/timing/severity/associated sxs/prior Treatment) HPI.Marland KitchenMarland KitchenMarland KitchenLevel V caveat for psychiatric illness. Patient has delusional thoughts. She thinks spirits are communicating to her via hypnosis and signals. Information is being passed via her cell phone and television. References to Jeryl Columbia and Avon Gully controlling her. Uncertain past psychiatric history.  Past Medical History  Diagnosis Date  . Crohn disease [facility])    Past Surgical History  Procedure Laterality Date  . Colon surgery    . Eye surgery     No family history on file. Social History  Substance Use Topics  . Smoking status: Never Smoker   . Smokeless tobacco: Never Used  . Alcohol Use: No     Comment: occ   OB History    No data available     Review of Systems    Allergies  Review of patient's allergies indicates no known allergies.  Home Medications   Prior to Admission medications   Medication Sig Start Date End Date Taking? Authorizing Provider  ibuprofen (ADVIL,MOTRIN) 800 MG tablet Take 1 tablet (800 mg total) by mouth 3 (three) times daily. 05/09/15   Hannah Muthersbaugh, PA-C  loperamide (IMODIUM A-D) 2 MG tablet Take 1 tablet (2 mg total) by mouth 4 (four) times daily as needed for diarrhea or loose stools. 09/05/15   Fredia Sorrow, MD  Multiple Vitamin (MULTIVITAMIN WITH MINERALS) TABS tablet Take 1 tablet by mouth daily.    Historical Provider, MD  promethazine (PHENERGAN) 25 MG tablet Take 1 tablet (25 mg total) by mouth every 6 (six) hours as needed for nausea or vomiting. 09/05/15   Fredia Sorrow, MD   BP 135/93 mmHg  Pulse 94  Temp(Src) 98.4 F (36.9 C) (Oral)  Resp 16  SpO2 98%  LMP  (LMP Unknown) Physical Exam  Constitutional: She is oriented to person,  place, and time. She appears well-developed and well-nourished.  HENT:  Head: Normocephalic and atraumatic.  Eyes: Conjunctivae and EOM are normal. Pupils are equal, round, and reactive to light.  Neck: Normal range of motion. Neck supple.  Cardiovascular: Normal rate and regular rhythm.   Pulmonary/Chest: Effort normal and breath sounds normal.  Abdominal: Soft. Bowel sounds are normal.  Musculoskeletal: Normal range of motion.  Neurological: She is alert and oriented to person, place, and time.  Skin: Skin is warm and dry.  Psychiatric:  Flight of ideas, delusional thinking, tangential thinking  Nursing note and vitals reviewed.   ED Course  Procedures (including critical care time) Labs Review Labs Reviewed  COMPREHENSIVE METABOLIC PANEL - Abnormal; Notable for the following:    Glucose, Bld 107 (*)    All other components within normal limits  ACETAMINOPHEN LEVEL - Abnormal; Notable for the following:    Acetaminophen (Tylenol), Serum <10 (*)    All other components within normal limits  ETHANOL  SALICYLATE LEVEL  CBC  URINE RAPID DRUG SCREEN, HOSP PERFORMED  I-STAT BETA HCG BLOOD, ED ([facility], [facility], [facility] ONLY)    Imaging Review No results found. I have personally reviewed and evaluated these images and lab results as part of my medical decision-making.   EKG Interpretation None      MDM   Final diagnoses:  Psychosis, unspecified psychosis type    Patient is psychotic. Will transfer to psych  unit.    Nat Christen, MD 01/06/16 [phone]

## 2016-01-06 NOTE — BH Assessment (Signed)
Patient has been accepted to [facility].  Accepting physician is Dr. Jerilee Hoh.  Attending Physician will be Dr. Bary Leriche.  Patient has been assigned to room 304, by [place].  Call report to [phone].  Representative/Transfer Coordinator is Barbie Haggis ER Staff (Toyka P., TTS) made aware of acceptance.

## 2016-01-07 DIAGNOSIS — F22 Delusional disorders: Principal | ICD-10-CM

## 2016-01-07 LAB — LIPID PANEL
Cholesterol: 191 mg/dL (ref 0–200)
HDL: 48 mg/dL (ref >40)
LDL Cholesterol: 121 mg/dL — ABNORMAL HIGH (ref 0–99)
Total CHOL/HDL Ratio: 4 RATIO
Triglycerides: 110 mg/dL (ref <150)
VLDL: 22 mg/dL (ref 0–40)

## 2016-01-07 LAB — TSH: TSH: 2.461 u[IU]/mL (ref 0.350–4.500)

## 2016-01-07 LAB — HEMOGLOBIN A1C: Hgb A1c MFr Bld: 5.2 % (ref 4.0–6.0)

## 2016-01-07 MED ORDER — RISPERIDONE 3 MG PO TABS
3.0000 mg | ORAL_TABLET | Freq: Two times a day (BID) | ORAL | Status: DC
  Administered 2016-01-07 – 2016-01-08 (×2): 3 mg via ORAL
  Filled 2016-01-07 (×2): qty 1

## 2016-01-07 NOTE — Plan of Care (Signed)
Problem: Alteration in thought process Goal: LTG-Patient has not harmed self or others in at least 2 days Outcome: Progressing No SI/HI at this time.

## 2016-01-07 NOTE — H&P (Addendum)
Psychiatric Admission Assessment Adult  Patient Identification: Vicki Lee MRN:  [id number] Date of Evaluation:  01/07/2016 Chief Complaint:  Psychotic disorder Principal Diagnosis: <principal problem not specified> Diagnosis:   Patient Active Problem List   Diagnosis Date Noted  . 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 Delusional disorder, first episode, currently in acute episode ([place]) [F22] 01/06/2016  . Blood in stool [K92.1] 02/26/2014   History of Present Illness:  Identifying data. Vicki Lee is a 35 year old female with no past psychiatric history.  Chief complaint. "I was very nervous."  History of present illness. Information was obtained from the patient and the chart. 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 She is very nervous, insomnia, somewhat paranoid, unable to focus, unable to proceeding scool she was in counseling for a while but no medications were prescribed. She reports that she's been well up until a few days ago when she again became insomniac, very "nervous", paranoid, and hearing music. She developed paranoid delusions and felt frightened that people were out to get her and that she is controlled by other people and agencies. She was preoccupied with Vicki Lee and Vicki Lee. She was on a mission to save some children. She was argumentative and uncooperative in the emergency room. This morning she is much more pleasant and cooperative. She is no longer preoccupied with her paranoid delusions. She accepted medications and participates in programming. She denies any symptoms of depression or anxiety. She is no longer frightened and denies hearing voices. She denies alcohol, prescription pill, or illicit substance use.  Past psychiatric history. 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 She denies ever attempting suicide.   Family psychiatric history. Nonreported.  Social history. She was at [employer]" in 2011 when she failed a class and amount of money to continue. This was most likely precipitated by a brief psychotic episode then. She is married and continues to live with her husband but they are separated and about divorce. Following discharge she intends to move in with her parents in [facility] who are very supportive. She has no children. She is currently unemployed but has had multiple jobs through [employer] last one at the [employer]. She has had insurance.   Total Time spent with patient: 1 hour  Past Psychiatric History: None.  Is the patient at risk to self? No.  Has the patient been a risk to self in the past 6 months? No.  Has the patient been a risk to self within the distant past? No.  Is the patient a risk to others? No.  Has the patient been a risk to others in the past 6 months? No.  Has the patient been a risk to others within the distant past? No.   Prior Inpatient Therapy:   Prior Outpatient Therapy:    Alcohol Screening: 1. How often do you have a drink containing alcohol?: Never 9. Have you or someone else been injured as a result of your drinking?: No 10. Has a relative or friend or a doctor or another health worker been concerned about your drinking or suggested you cut down?: No Alcohol Use Disorder Identification Test Final Score (AUDIT): 0 Brief Intervention: AUDIT score less than 7 or less-screening does not suggest unhealthy drinking-brief  intervention not indicated Substance Abuse History in the last 12 months:  No. Consequences of Substance Abuse: NA Previous Psychotropic Medications: No  Psychological Evaluations: No  Past Medical History:  Past Medical History  Diagnosis Date  . Crohn disease [facility])     Past Surgical History  Procedure Laterality Date  . Colon  surgery    . Eye surgery     Family History: History reviewed. No pertinent family history. Family Psychiatric  History: None reported.  Tobacco Screening: @FLOW [phone])::1)@ Social History:  History  Alcohol Use No    Comment: occ     History  Drug Use No    Additional Social History:                           Allergies:  No Known Allergies Lab Results:  Results for orders placed or performed during the hospital encounter of 01/06/16 (from the past 48 hour(s))  Hemoglobin A1c     Status: None   Collection Time: 01/07/16  6:53 AM  Result Value Ref Range   Hgb A1c MFr Bld 5.2 4.0 - 6.0 %  Lipid panel, fasting     Status: Abnormal   Collection Time: 01/07/16  6:53 AM  Result Value Ref Range   Cholesterol 191 0 - 200 mg/dL   Triglycerides 110 <150 mg/dL   HDL 48 >40 mg/dL   Total CHOL/HDL Ratio 4.0 RATIO   VLDL 22 0 - 40 mg/dL   LDL Cholesterol 121 (H) 0 - 99 mg/dL    Comment:        Total Cholesterol/HDL:CHD Risk Coronary Heart Disease Risk Table                     Men   Women  1/2 Average Risk   3.4   3.3  Average Risk       5.0   4.4  2 X Average Risk   9.6   7.1  3 X Average Risk  23.4   11.0        Use the calculated Patient Ratio above and the CHD Risk Table to determine the patient's CHD Risk.        ATP III CLASSIFICATION (LDL):  <100     mg/dL   Optimal  100-129  mg/dL   Near or Above                    Optimal  130-159  mg/dL   Borderline  160-189  mg/dL   High  >190     mg/dL   Very High   TSH     Status: None   Collection Time: 01/07/16  6:53 AM  Result Value Ref Range   TSH 2.461 0.350 - 4.500 uIU/mL    Blood Alcohol level:  Lab Results  Component Value Date   ETH <5 82/95/6213    Metabolic Disorder Labs:  Lab Results  Component Value Date   HGBA1C 5.2 01/07/2016   No results found for: PROLACTIN Lab Results  Component Value Date   CHOL 191 01/07/2016   TRIG 110 01/07/2016   HDL 48 01/07/2016   CHOLHDL 4.0  01/07/2016   VLDL 22 01/07/2016   LDLCALC 121* 01/07/2016    Current Medications: Current Facility-Administered Medications  Medication Dose Route Frequency Provider Last Rate Last Dose  . acetaminophen (TYLENOL) tablet 650 mg  650 mg Oral Q6H PRN Clovis Fredrickson, MD      .  alum & mag hydroxide-simeth (MAALOX/MYLANTA) 200-200-20 MG/5ML suspension 30 mL  30 mL Oral Q4H PRN Jolanta B Pucilowska, MD      . LORazepam (ATIVAN) tablet 2 mg  2 mg Oral Q6H PRN Jolanta B Pucilowska, MD      . magnesium hydroxide (MILK OF MAGNESIA) suspension 30 mL  30 mL Oral Daily PRN Jolanta B Pucilowska, MD      . prenatal vitamin w/FE, FA (PRENATAL 1 + 1) 27-1 MG tablet 1 tablet  1 tablet Oral Q1200 Clovis Fredrickson, MD   1 tablet at 01/07/16 1212  . risperiDONE (RISPERDAL) tablet 2 mg  2 mg Oral BID Clovis Fredrickson, MD   2 mg at 01/07/16 0835  . traZODone (DESYREL) tablet 100 mg  100 mg Oral QHS Clovis Fredrickson, MD   100 mg at 01/06/16 2213   PTA Medications: Prescriptions prior to admission  Medication Sig Dispense Refill Last Dose  . diphenhydrAMINE (BENADRYL) 50 MG capsule Take 50 mg by mouth every 6 (six) hours as needed for sleep.   Past Week at Unknown time  . Multiple Vitamin (MULTIVITAMIN WITH MINERALS) TABS tablet Take 1 tablet by mouth daily.   01/05/2016 at Unknown time    Musculoskeletal: Strength & Muscle Tone: within normal limits Gait & Station: normal Patient leans: N/A  Psychiatric Specialty Exam: I reviewed physical exam performed in the emergency room and agree with the findings. Physical Exam  Nursing note and vitals reviewed.   Review of Systems  Psychiatric/Behavioral: Positive for hallucinations.  All other systems reviewed and are negative.   Blood pressure 122/77, pulse 107, temperature 98.2 F (36.8 C), temperature source Oral, resp. rate 18, height 5' 5.75" (1.67 m), weight 90.266 kg (199 lb), last menstrual period 12/23/2015, SpO2 99 %.Body mass index is  32.37 kg/(m^2).  See SRA.                                                  Sleep:  Number of Hours: 6.45     Treatment Plan Summary: Daily contact with patient to assess and evaluate symptoms and progress in treatment and Medication management   Ms. Nghiem is a 35 year old female with no past psychiatric history admitted for paranoid delusions and psychotic disorganization with bizarre behavior.  1. Psychosis. We started Risperdal for psychosis. The patient tolerates medications well. I will increase Risperdal to 3 mg bid.  2. Anxiety. Ativan as needed is available.  3. Insomnia. She responded well to trazodone.  4. Metabolic syndrome screening. Lipid profile, hemoglobin A1c and TSH are normal. Prolactin is pending.   5. Disposition. She will likely be discharged with her parents. She will follow up with [facility] in [facility].   Observation Level/Precautions:  15 minute checks  Laboratory:  CBC Chemistry Profile HbAIC UDS UA  Psychotherapy:    Medications:    Consultations:    Discharge Concerns:    Estimated LOS:  Other:     I certify that inpatient services furnished can reasonably be expected to improve the patient's condition.    Orson Slick, MD 4/27/20173:16 PM

## 2016-01-07 NOTE — BHH Group Notes (Signed)
[facility] LCSW Group Therapy  01/07/2016 3:27 PM  Type of Therapy:  Group Therapy  Participation Level:  Active  Participation Quality:  Attentive  Affect:  Appropriate  Cognitive:  Alert  Insight:  Limited  Engagement in Therapy:  Limited  Modes of Intervention:  Discussion, Education, Socialization and Support  Summary of Progress/Problems: Balance in life: Patients will discuss the concept of balance and how it looks and feels to be unbalanced. Pt will identify areas in their life that is unbalanced and ways to become more balanced.  Annamay attended group and stayed the entire time. She discussed her family and how important they are to her.   [place] MSW, [place]  01/07/2016, 3:27 PM

## 2016-01-07 NOTE — Progress Notes (Signed)
Recreation Therapy Notes  Date: 04.27.17 Time: 9:30 am Location: Craft Room  Group Topic: Leisure Education  Goal Area(s) Addresses:  Patient will identify activities for each letter of the alphabet. Patient will verbalize emotion felt when participating in activities.  Behavioral Response: Attentive, Interactive  Intervention: Leisure Alphabet  Activity: Patients were given an Leisure Air traffic controller and as a group picked leisure activities for each letter of the alphabet.  Education: LRT educated patients on why leisure is important.  Education Outcome: In group clarification offered  Clinical Observations/Feedback: Patient worked on activity by writing words down. Patient used words like "nope" and "opposite". Patient contributed to group discussion by stating some words she wrote down.  Leonette Monarch, LRT/CTRS 01/07/2016 10:16 AM

## 2016-01-07 NOTE — BHH Suicide Risk Assessment (Signed)
[facility] Admission Suicide Risk Assessment   Nursing information obtained from:    Demographic factors:    Current Mental Status:    Loss Factors:    Historical Factors:    Risk Reduction Factors:     Total Time spent with patient: 1 hour Principal Problem: <principal problem not specified> Diagnosis:   Patient Active Problem List   Diagnosis Date Noted  . 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 Delusional disorder, first episode, currently in acute episode ([place]) [F22] 01/06/2016  . Blood in stool [K92.1] 02/26/2014   Subjective Data: Paranoid delusions, insomnia.  Continued Clinical Symptoms:  Alcohol Use Disorder Identification Test Final Score (AUDIT): 0 The "Alcohol Use Disorders Identification Test", Guidelines for Use in Primary Care, Second Edition.  World Pharmacologist [facility]). Score between 0-7:  no or low risk or alcohol related problems. Score between 8-15:  moderate risk of alcohol related problems. Score between 16-19:  high risk of alcohol related problems. Score 20 or above:  warrants further diagnostic evaluation for alcohol dependence and treatment.   CLINICAL FACTORS:   Currently Psychotic   Musculoskeletal: Strength & Muscle Tone: within normal limits Gait & Station: normal Patient leans: N/A  Psychiatric Specialty Exam: Review of Systems  Psychiatric/Behavioral: Positive for hallucinations.  All other systems reviewed and are negative.   Blood pressure 122/77, pulse 107, temperature 98.2 F (36.8 C), temperature source Oral, resp. rate 18, height 5' 5.75" (1.67 m), weight 90.266 kg (199 lb), last menstrual period 12/23/2015, SpO2 99 %.Body mass index is 32.37 kg/(m^2).  General Appearance: Casual  Eye Contact::  Good  Speech:  Clear and Coherent  Volume:  Normal  Mood:  Anxious  Affect:  Appropriate  Thought Process:  Goal Directed  Orientation:  Full (Time, Place, and Person)   Thought Content:  Delusions and Paranoid Ideation  Suicidal Thoughts:  No  Homicidal Thoughts:  No  Memory:  Immediate;   Fair Recent;   Fair Remote;   Fair  Judgement:  Impaired  Insight:  Lacking  Psychomotor Activity:  Normal  Concentration:  Fair  Recall:  [place]  Language: Fair  Akathisia:  No  Handed:  Right  AIMS (if indicated):     Assets:  Communication Skills Desire for Improvement Financial Resources/Insurance Housing Physical Health Resilience Social Support  Sleep:  Number of Hours: 6.45  Cognition: WNL  ADL's:  Intact    COGNITIVE FEATURES THAT CONTRIBUTE TO RISK:  None    SUICIDE RISK:   Moderate:  Frequent suicidal ideation with limited intensity, and duration, some specificity in terms of plans, no associated intent, good self-control, limited dysphoria/symptomatology, some risk factors present, and identifiable protective factors, including available and accessible social support.  PLAN OF CARE: Hospital admission, medication management, discharge planning.  Ms. Fede is a 35 year old female with no past psychiatric history admitted for paranoid delusions and psychotic disorganization with bizarre behavior.  1. Psychosis. We started Risperdal for psychosis. The patient tolerates medications well.  2. Anxiety. Ativan as needed is available.  3. Insomnia. She responded well to trazodone.  4. Disposition. She will likely be discharged with her parents. She will follow up with [facility] in [facility].   I certify that inpatient services furnished can reasonably be expected to improve the patient's condition.   Orson Slick, MD 01/07/2016, 3:09 PM

## 2016-01-07 NOTE — Progress Notes (Signed)
Patient with appropriate affect, cooperative behavior with meals, meds and plan of care. No SI/HI at this time. Therapy groups encouraged. Quiet with peers, reading Bible in room this am. No distress, no complaint. Safety maintained.

## 2016-01-07 NOTE — Tx Team (Signed)
Interdisciplinary Treatment Plan Update (Adult)  Date:  01/07/2016 Time Reviewed:  3:48 PM  Progress in Treatment: Attending groups: Yes. Participating in groups:  Yes. Taking medication as prescribed:  Yes. Tolerating medication:  Yes. Family/Significant othe contact made:  No, will contact:  if patient provides consent Patient understands diagnosis:  Yes. Discussing patient identified problems/goals with staff:  Yes. Medical problems stabilized or resolved:  Yes. Denies suicidal/homicidal ideation: Yes. Issues/concerns per patient self-inventory:  Yes. Other:  New problem(s) identified: No, Describe:  none reported  Discharge Plan or Barriers: Patient will stabilize and discharge home with parents and will need outpatient follow up in [facility] health services.  Reason for Continuation of Hospitalization: Delusions  Medication stabilization  Comments:  Estimated length of stay: up to 4 days, expected discharge Monday 01/11/16  New goal(s):  Review of initial/current patient goals per problem list:   1.  Goal(s): Participate in aftercare plan    Met:  No  Target date: by discharge  As evidenced by: patient will participate in aftercare plan AEB aftercare provider and housing plan identified at discharge 01/07/16: Patient can discharge home with parents once stabilized but will need to identify an outpatient provider by discharge.   2.  Goal (s): Decrease psychosis     Met:  No  Target date: by discharge  As evidenced by: patient demonstrates decreased symptoms of psychosis 01/07/16: Patient appears to be clearing and started on medications but will need continued monitoring for psychosis.     Attendees: Patient:  Vicki Lee 4/27/20173:43 PM  Physician:  Orson Slick, MD 4/27/20173:43 PM  Nursing:   Nicanor Bake, RN 4/27/20173:43 PM  Other:  Carmell Austria, LCSW 4/27/20173:43 PM  Other:   4/27/20173:43 PM  Other:   4/27/20173:43 PM   Other:  4/27/20173:43 PM  Other:  4/27/20173:43 PM  Other:  4/27/20173:43 PM  Other:  4/27/20173:43 PM  Other:  4/27/20173:43 PM  Other:  4/27/20173:43 PM  Other:   4/27/20173:43 PM    Scribe for Treatment Team:   Keene Breath, MSW, LCSW  01/07/2016, 3:48 PM

## 2016-01-07 NOTE — Progress Notes (Signed)
Patient is 35 year old BM H/O crohn dx and eye surgery. Pt is admitted to the unit with delusional thought processes. Pt thinks God is talking to her through the television and radio. States "my body is really going through it".  reports tingling sensations to both hands and feet. Pt is talkative and anxious. Cooperative with care. Speech clear. Interacting with peers and staff. Med compliant. No voiced thoughts of hurting herself. No c/o pain/discomfort noted. Will continue to monitor for safety and behavior.

## 2016-01-07 NOTE — Tx Team (Signed)
Initial Interdisciplinary Treatment Plan   PATIENT STRESSORS: Marital or family conflict Medication change or noncompliance   PATIENT STRENGTHS: Average or above average intelligence Communication skills   PROBLEM LIST: Problem List/Patient Goals Date to be addressed Date deferred Reason deferred Estimated date of resolution  depression 01/06/2016     psychosis 01/06/2016                                                DISCHARGE CRITERIA:  Improved stabilization in mood, thinking, and/or behavior  PRELIMINARY DISCHARGE PLAN: Return to previous living arrangement  PATIENT/FAMIILY INVOLVEMENT: This treatment plan has been presented to and reviewed with the patient, Shwanda Soltis, and/or family member.  The patient and family have been given the opportunity to ask questions and make suggestions.  Aleen Campi 01/07/2016, 12:12 AM

## 2016-01-08 LAB — PROLACTIN: PROLACTIN: 159.4 ng/mL — AB (ref 4.8–23.3)

## 2016-01-08 MED ORDER — TRAZODONE HCL 100 MG PO TABS: 100.0000 mg | ORAL_TABLET | Freq: Every day | ORAL | Status: DC

## 2016-01-08 MED ORDER — RISPERIDONE 2 MG PO TABS: 2.0000 mg | ORAL_TABLET | Freq: Every day | ORAL | Status: DC

## 2016-01-08 MED ORDER — RISPERIDONE 1 MG PO TABS: 2.0000 mg | ORAL_TABLET | Freq: Every day | ORAL | Status: DC

## 2016-01-08 NOTE — Plan of Care (Signed)
Problem: Alteration in thought process Goal: LTG-Patient behavior demonstrates decreased signs psychosis (Patient behavior demonstrates decreased signs of psychosis to the point the patient is safe to return home and continue treatment in an outpatient setting.)  Outcome: Progressing Pt does not appear to be responding to internal stimuli. She denies AVH at this time.  Problem: Ineffective individual coping Goal: STG: Patient will remain free from self harm Outcome: Progressing Pt remains free from harm.

## 2016-01-08 NOTE — BHH Group Notes (Signed)
[facility] LCSW Group Therapy  01/08/2016 3:21 PM  Type of Therapy:  Group Therapy  Participation Level:  Active  Participation Quality:  Attentive  Affect:  Appropriate  Cognitive:  Alert  Insight:  Improving  Engagement in Therapy:  Improving  Modes of Intervention:  Discussion, Education, Socialization and Support  Summary of Progress/Problems: Emotional Regulation: Patients will identify both negative and positive emotions. They will discuss emotions they have difficulty regulating and how they impact their lives. Patients will be asked to identify healthy coping skills to combat unhealthy reactions to negative emotions.   Vicki Lee attended group and stayed the entire time. She discussed feeling scared prior to admission due to paranoid thoughts. Pt states it is important to know when you need help and have the courage to ask.   [place] MSW, [place]  01/08/2016, 3:21 PM

## 2016-01-08 NOTE — Tx Team (Signed)
Interdisciplinary Treatment Plan Update (Adult)  Date:  01/08/2016 Time Reviewed:  3:10 PM  Progress in Treatment: Attending groups: Yes. Participating in groups:  Yes. Taking medication as prescribed:  Yes. Tolerating medication:  Yes. Family/Significant othe contact made:  Yes, individual(s) contacted:  patient's mother Patient understands diagnosis:  Yes. Discussing patient identified problems/goals with staff:  Yes. Medical problems stabilized or resolved:  Yes. Denies suicidal/homicidal ideation: Yes. Issues/concerns per patient self-inventory:  Yes. Other:  New problem(s) identified: No, Describe:  none reported  Discharge Plan or Barriers: Patient will stabilize and discharge home with parents and will need outpatient follow up in [facility] for mental health services.  Reason for Continuation of Hospitalization: Delusions  Medication stabilization  Comments:  Estimated length of stay: 0 days, will discharge today Friday 01/08/16  New goal(s):  Review of initial/current patient goals per problem list:   1.  Goal(s): Participate in aftercare plan    Met:  yes  Target date: by discharge  As evidenced by: patient will participate in aftercare plan AEB aftercare provider and housing plan identified at discharge 01/07/16: Patient can discharge home with parents once stabilized but will need to identify an outpatient provider by discharge.  01/08/16: Patient has an outpatient provider appt scheduled and will discharge home with her parents. Goal met.   2.  Goal (s): Decrease psychosis     Met:  yes  Target date: by discharge  As evidenced by: patient demonstrates decreased symptoms of psychosis 01/07/16: Patient appears to be clearing and started on medications but will need continued monitoring for psychosis.   01/08/16: Patient is stable for discharge per MD.    Attendees: Physician:  Orson Slick, MD 4/27/20173:43 PM  Nursing:   Tyler Pita, RN  4/27/20173:43 PM  Other:  Carmell Austria, LCSW 4/27/20173:43 PM  Other:   4/27/20173:43 PM  Other:   4/27/20173:43 PM  Other:  4/27/20173:43 PM  Other:  4/27/20173:43 PM  Other:  4/27/20173:43 PM  Other:  4/27/20173:43 PM  Other:  4/27/20173:43 PM  Other:  4/27/20173:43 PM  Other:   4/27/20173:43 PM    Scribe for Treatment Team:   Keene Breath, MSW, LCSW  01/08/2016, 3:10 PM

## 2016-01-08 NOTE — Discharge Summary (Signed)
Physician Discharge Summary Note  Patient:  Vicki Lee is an 35 y.o., female MRN:  [id number] DOB:  01/29/1981 Patient phone:  [phone] (home)  Patient address:   [address]  [place] [address],  Total Time spent with patient: 30 minutes  Date of Admission:  01/06/2016 Date of Discharge: 01/08/2016  Reason for Admission:  Psychotic break.  Identifying data. Vicki Lee is a 35 year old female with no past psychiatric history.  Chief complaint. "I was very nervous."  History of present illness. Information was obtained from the patient and the chart. 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 She is very nervous, insomnia, somewhat paranoid, unable to focus, unable to proceeding scool she was in counseling for a while but no medications were prescribed. She reports that she's been well up until a few days ago when she again became insomniac, very "nervous", paranoid, and hearing music. She developed paranoid delusions and felt frightened that people were out to get her and that she is controlled by other people and agencies. She was preoccupied with Cam Ernestina Patches and Jeryl Columbia. She was on a mission to save some children. She was argumentative and uncooperative in the emergency room. This morning she is much more pleasant and cooperative. She is no longer preoccupied with her paranoid delusions. She accepted medications and participates in programming. She denies any symptoms of depression or anxiety. She is no longer frightened and denies hearing voices. She denies alcohol, prescription pill, or illicit substance use.  Past psychiatric history. 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 She denies ever attempting suicide.   Family psychiatric history. Nonreported.  Social history. She was at [employer]" in 2011  when she failed a class and amount of money to continue. This was most likely precipitated by a brief psychotic episode then. She is married and continues to live with her husband but they are separated and about divorce. Following discharge she intends to move in with her parents in [facility] who are very supportive. She has no children. She is currently unemployed but has had multiple jobs through [employer] last one at the [employer]. She has had insurance.  Principal Problem: Delusional disorder, first episode, currently in acute episode [facility]) Discharge Diagnoses: Patient Active Problem List   Diagnosis Date Noted  . Delusional disorder, first episode, currently in acute episode ([place]) [F22] 01/06/2016  . Blood in stool [K92.1] 02/26/2014    Past Psychiatric History: None reported.  Past Medical History:  Past Medical History  Diagnosis Date  . Crohn disease [facility])     Past Surgical History  Procedure Laterality Date  . Colon surgery    . Eye surgery     Family History: History reviewed. No pertinent family history. Family Psychiatric  History: None reported. Social History:  History  Alcohol Use No    Comment: occ     History  Drug Use No    Social History   Social History  . Marital Status: Married    Spouse Name: N/A  . Number of Children: N/A  . Years of Education: N/A   Social History Main Topics  . Smoking status: Never Smoker   . Smokeless tobacco: Never Used  . Alcohol Use: No     Comment: occ  . Drug Use:  No  . Sexual Activity: Yes    Birth Control/ Protection: None   Other Topics Concern  . None   Social History Narrative    Hospital Course:    Vicki Lee is a 35 year old female with no past psychiatric history admitted for paranoid delusions and psychotic disorganization.  1. Psychosis. We started Risperdal for psychosis with quick and full resolution of symptoms.   2. Insomnia. She responded well to trazodone.  3. Metabolic  syndrome screening. Lipid profile, hemoglobin A1c and TSH are normal. Prolactin is elevated at 159, likely from Risperdal. She will follow up with Endocrinologist.  4. Disposition. She was discharged to home with her parents. She will follow up with [facility].   Physical Findings: AIMS: Facial and Oral Movements Muscles of Facial Expression: None, normal Lips and Perioral Area: None, normal Jaw: None, normal Tongue: None, normal,Extremity Movements Upper (arms, wrists, hands, fingers): None, normal Lower (legs, knees, ankles, toes): None, normal, Trunk Movements Neck, shoulders, hips: None, normal, Overall Severity Severity of abnormal movements (highest score from questions above): None, normal Incapacitation due to abnormal movements: None, normal Patient's awareness of abnormal movements (rate only patient's report): No Awareness, Dental Status Current problems with teeth and/or dentures?: No Does patient usually wear dentures?: No  CIWA:  CIWA-Ar Total: 0 COWS:  COWS Total Score: 0  Musculoskeletal: Strength & Muscle Tone: within normal limits Gait & Station: normal Patient leans: N/A  Psychiatric Specialty Exam: Review of Systems  All other systems reviewed and are negative.   Blood pressure 143/89, pulse 115, temperature 98 F (36.7 C), temperature source Oral, resp. rate 20, height 5' 5.75" (1.67 m), weight 90.266 kg (199 lb), last menstrual period 12/23/2015, SpO2 99 %.Body mass index is 32.37 kg/(m^2).  See SRA.                                                  Sleep:  Number of Hours: 5.5   Have you used any form of tobacco in the last 30 days? (Cigarettes, Smokeless Tobacco, Cigars, and/or Pipes): No  Has this patient used any form of tobacco in the last 30 days? (Cigarettes, Smokeless Tobacco, Cigars, and/or Pipes) Yes, No  Blood Alcohol level:  Lab Results  Component Value Date   ETH <5 62/22/9798    Metabolic Disorder Labs:   Lab Results  Component Value Date   HGBA1C 5.2 01/07/2016   Lab Results  Component Value Date   PROLACTIN 159.4* 01/07/2016   Lab Results  Component Value Date   CHOL 191 01/07/2016   TRIG 110 01/07/2016   HDL 48 01/07/2016   CHOLHDL 4.0 01/07/2016   VLDL 22 01/07/2016   LDLCALC 121* 01/07/2016    See Psychiatric Specialty Exam and Suicide Risk Assessment completed by Attending Physician prior to discharge.  Discharge destination:  Home  Is patient on multiple antipsychotic therapies at discharge:  No   Has Patient had three or more failed trials of antipsychotic monotherapy by history:  No  Recommended Plan for Multiple Antipsychotic Therapies: NA  Discharge Instructions    Diet - low sodium heart healthy    Complete by:  As directed      Increase activity slowly    Complete by:  As directed             Medication List  TAKE these medications      Indication   diphenhydrAMINE 50 MG capsule  Commonly known as:  BENADRYL  Take 50 mg by mouth every 6 (six) hours as needed for sleep.      multivitamin with minerals Tabs tablet  Take 1 tablet by mouth daily.      risperiDONE 2 MG tablet  Commonly known as:  RISPERDAL  Take 1 tablet (2 mg total) by mouth at bedtime.  Start taking on:  01/09/2016   Indication:  Psychosis     traZODone 100 MG tablet  Commonly known as:  DESYREL  Take 1 tablet (100 mg total) by mouth at bedtime.   Indication:  Trouble Sleeping           Follow-up Information    Go to [facility].   Why:  For follow-up care appt    Contact information:   [address], [place] Ph [phone] Fax [phone] Walk in clinic 8:30am-3pm M-F, see Chip Boer for hospital follow up       Follow-up recommendations:  Activity:  As tolerated. Diet:  Low sodiumow sodium heart healthy.l Other:  Keep follow up appointments.  Comments:    Signed: Orson Slick, MD 01/08/2016, 12:33 PM

## 2016-01-08 NOTE — Progress Notes (Signed)
  [facility] Adult Case Management Discharge Plan :  Will you be returning to the same living situation after discharge:  No.Patient will not return home with husband but will go to live with parents. At discharge, do you have transportation home?: Yes,  patient's mother will pick up Do you have the ability to pay for your medications: Yes,  patient has insurance  Release of information consent forms completed and in the chart;  Patient's signature needed at discharge.  Patient to Follow up at: Follow-up Information    Follow up with [facility]. Go on 01/12/2016.   Why:  For follow-up care appt on Tuesday 01/12/16 at 12:30pm to see Carroll Sage information:   [address], [place] Ph [phone] Fax [phone] Walk in clinic 8:30am-3pm M-F, see Chip Boer for hospital follow up       Next level of care provider has access to [place] and Suicide Prevention discussed: Yes,  SPE discussed with patient and Laverle Patter (mother) [phone]   Have you used any form of tobacco in the last 30 days? (Cigarettes, Smokeless Tobacco, Cigars, and/or Pipes): No  Has patient been referred to the Quitline?: N/A patient is not a smoker  Patient has been referred for addiction treatment: N/A  Keene Breath, MSW, LCSW 01/08/2016, 3:12 PM [phone]

## 2016-01-08 NOTE — Progress Notes (Signed)
D: Pt is pleasant and cooperative this evening. She denies SI/HI/AVH at this time. Denies pain. Pt is focused on discharge, stating that her goal today was "to go home." A: Emotional support and encouragement provided. Medications administered with education. q15 minute safety checks maintained. R: Pt remains free from harm. Will continue to monitor.

## 2016-01-08 NOTE — Progress Notes (Signed)
Recreation Therapy Notes  Date: 04.28.17 Time: 1:00 pm Location: Craft Room  Group Topic: Communication, Problem solving, Teamwork  Goal Area(s) Addresses:  Patient will effectively work with peer towards shared goal. Patient will identify skills used to make activity successful. Patient will identify benefit of using group skills effectively post d/c.  Behavioral Response: Attentive, Interactive  Intervention: [employer]  Activity: Patients were given 15 pipe cleaners and instructed to build a free standing tower using al 15 pipe cleaners. Patients were given 2 minutes to strategize. After approximately 5 minutes of building, patients were instructed to put their dominant hand behind their back. After approximately 3 minutes, patients were instructed to stop talking to each other.  Education:LRT educated patients on healthy support systems.  Education Outcome: In group clarification offered   Clinical Observations/Feedback: Patient worked with peers to build a tower. Patient used effective communication, problem solving, and teamwork skills. Patient contributed to group discussion by stating what skills her team used to be successful.  Leonette Monarch, LRT/CTRS 01/08/2016 2:58 PM

## 2016-01-08 NOTE — Progress Notes (Signed)
Patient denies SI/HI, denies A/V hallucinations. Patient verbalizes understanding of discharge instructions, follow up care and prescriptions. Patient given all belongings from  locker. Patient escorted out by staff, transported by family.

## 2016-01-08 NOTE — BHH Suicide Risk Assessment (Signed)
[facility] INPATIENT:  Family/Significant Other Suicide Prevention Education  Suicide Prevention Education:  Education Completed; Laverle Patter (mother) [phone] has been identified by the patient as the family member/significant other with whom the patient will be residing, and identified as the person(s) who will aid the patient in the event of a mental health crisis (suicidal ideations/suicide attempt).  With written consent from the patient, the family member/significant other has been provided the following suicide prevention education, prior to the and/or following the discharge of the patient.  The suicide prevention education provided includes the following:  Suicide risk factors  Suicide prevention and interventions  National Suicide Hotline telephone number  [facility] assessment telephone number  [facility] Emergency Assistance [place] and/or Residential Mobile Crisis Unit telephone number  Request made of family/significant other to:  Remove weapons (e.g., guns, rifles, knives), all items previously/currently identified as safety concern.    Remove drugs/medications (over-the-counter, prescriptions, illicit drugs), all items previously/currently identified as a safety concern.  The family member/significant other verbalizes understanding of the suicide prevention education information provided.  The family member/significant other agrees to remove the items of safety concern listed above.  Keene Breath, MSW, LCSW 01/08/2016, 11:08 AM

## 2016-01-08 NOTE — BHH Suicide Risk Assessment (Signed)
[facility] Discharge Suicide Risk Assessment   Principal Problem: Delusional disorder, first episode, currently in acute episode [facility] Discharge Diagnoses:  Patient Active Problem List   Diagnosis Date Noted  . Delusional disorder, first episode, currently in acute episode ([place]) [F22] 01/06/2016  . Blood in stool [K92.1] 02/26/2014    Total Time spent with patient: 30 minutes  Musculoskeletal: Strength & Muscle Tone: within normal limits Gait & Station: normal Patient leans: N/A  Psychiatric Specialty Exam: Review of Systems  All other systems reviewed and are negative.   Blood pressure 143/89, pulse 115, temperature 98 F (36.7 C), temperature source Oral, resp. rate 20, height 5' 5.75" (1.67 m), weight 90.266 kg (199 lb), last menstrual period 12/23/2015, SpO2 99 %.Body mass index is 32.37 kg/(m^2).  General Appearance: Casual  Eye Contact::  Good  Speech:  Clear and [id number]  Volume:  Normal  Mood:  Euthymic  Affect:  Appropriate  Thought Process:  Goal Directed  Orientation:  Full (Time, Place, and Person)  Thought Content:  WDL  Suicidal Thoughts:  No  Homicidal Thoughts:  No  Memory:  Immediate;   Fair Recent;   Fair Remote;   Fair  Judgement:  Fair  Insight:  Fair  Psychomotor Activity:  Normal  Concentration:  Fair  Recall:  [employer] of Knowledge:Fair  Language: Fair  Akathisia:  No  Handed:  Right  AIMS (if indicated):     Assets:  Communication Skills Desire for Improvement Financial Resources/Insurance Housing Physical Health Resilience Social Support  Sleep:  Number of Hours: 5.5  Cognition: WNL  ADL's:  Intact   Mental Status Per Nursing Assessment::   On Admission:     Demographic Factors:  Divorced or widowed and Unemployed  Loss Factors: Loss of significant relationship  Historical Factors: Impulsivity  Risk Reduction Factors:   Sense of responsibility to family, Living with another person, especially a relative and Positive social  support  Continued Clinical Symptoms:  Currently Psychotic  Cognitive Features That Contribute To Risk:  None    Suicide Risk:  Minimal: No identifiable suicidal ideation.  Patients presenting with no risk factors but with morbid ruminations; may be classified as minimal risk based on the severity of the depressive symptoms  Follow-up Information    Go to [facility].   Why:  For follow-up care appt    Contact information:   [address], [place] Ph [phone] Fax [phone] Walk in clinic 8:30am-3pm M-F, see Chip Boer for hospital follow up       Plan Of Care/Follow-up recommendations:  Activity:  As tolerated. Diet:  Low sodium heart healthy. Other:  Keep follow-up appointments.  Orson Slick, MD 01/08/2016, 12:30 PM
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
## 2016-02-22 ENCOUNTER — Emergency Department (HOSPITAL_BASED_OUTPATIENT_CLINIC_OR_DEPARTMENT_OTHER)
Admission: EM | Admit: 2016-02-22 | Discharge: 2016-02-22 | Disposition: A | Discharge status: Home / Self Care | Payer: BLUE CROSS/BLUE SHIELD | Attending: Emergency Medicine | Admitting: Emergency Medicine

## 2016-02-22 ENCOUNTER — Encounter (HOSPITAL_BASED_OUTPATIENT_CLINIC_OR_DEPARTMENT_OTHER): Payer: Self-pay | Admitting: Emergency Medicine

## 2016-02-22 DIAGNOSIS — Z5321 Procedure and treatment not carried out due to patient leaving prior to being seen by health care provider: Secondary | ICD-10-CM | POA: Insufficient documentation

## 2016-02-22 DIAGNOSIS — R0981 Nasal congestion: Secondary | ICD-10-CM | POA: Diagnosis present

## 2016-02-22 NOTE — ED Notes (Signed)
Upon arrival to room with pa, pt not in room, will attempt to see if she left without notifying staff

## 2016-02-22 NOTE — ED Notes (Signed)
Patient states that she took some medications last night and then after she had a tingling sensation to her nose and facial sinus. The patient reports that he has had sinus congestion since then. Patient took Trazodone and Risperdal. - she was placed on these due to hearing voices. The patient reports that she has been taking these since she was d/c'd from [place] [facility] for "crazy" issues and last night is the first time she had this sensation. She reports that she has been "woozy" in the past from them

## 2016-02-22 NOTE — ED Notes (Signed)
Pt not present in department, lobby or parking lot at this time

## 2016-02-26 NOTE — ED Provider Notes (Signed)
Patient left without being seen after triage  Vicki Morgan, MD 02/26/16 [phone]

## 2016-05-17 ENCOUNTER — Emergency Department
Admission: EM | Admit: 2016-05-17 | Discharge: 2016-05-18 | Disposition: A | Discharge status: Other Acute Inpatient Hospital | Payer: BLUE CROSS/BLUE SHIELD | Attending: Emergency Medicine | Admitting: Emergency Medicine

## 2016-05-17 ENCOUNTER — Encounter: Payer: Self-pay | Admitting: Emergency Medicine

## 2016-05-17 DIAGNOSIS — R443 Hallucinations, unspecified: Secondary | ICD-10-CM

## 2016-05-17 DIAGNOSIS — F29 Unspecified psychosis not due to a substance or known physiological condition: Secondary | ICD-10-CM | POA: Diagnosis not present

## 2016-05-17 DIAGNOSIS — F200 Paranoid schizophrenia: Secondary | ICD-10-CM | POA: Diagnosis not present

## 2016-05-17 DIAGNOSIS — Z79899 Other long term (current) drug therapy: Secondary | ICD-10-CM | POA: Insufficient documentation

## 2016-05-17 DIAGNOSIS — Z720 Tobacco use: Secondary | ICD-10-CM | POA: Diagnosis not present

## 2016-05-17 DIAGNOSIS — F329 Major depressive disorder, single episode, unspecified: Secondary | ICD-10-CM

## 2016-05-17 DIAGNOSIS — F32A Depression, unspecified: Secondary | ICD-10-CM

## 2016-05-17 DIAGNOSIS — Z046 Encounter for general psychiatric examination, requested by authority: Secondary | ICD-10-CM | POA: Diagnosis present

## 2016-05-17 LAB — COMPREHENSIVE METABOLIC PANEL
ALT: 10 U/L — AB (ref 14–54)
AST: 16 U/L (ref 15–41)
Albumin: 3.9 g/dL (ref 3.5–5.0)
Alkaline Phosphatase: 88 U/L (ref 38–126)
Anion gap: 6 (ref 5–15)
BILIRUBIN TOTAL: 0.4 mg/dL (ref 0.3–1.2)
CALCIUM: 9.3 mg/dL (ref 8.9–10.3)
CHLORIDE: 107 mmol/L (ref 101–111)
CO2: 26 mmol/L (ref 22–32)
CREATININE: 0.93 mg/dL (ref 0.44–1.00)
Glucose, Bld: 100 mg/dL — ABNORMAL HIGH (ref 65–99)
Potassium: 3.5 mmol/L (ref 3.5–5.1)
Sodium: 139 mmol/L (ref 135–145)
TOTAL PROTEIN: 8 g/dL (ref 6.5–8.1)

## 2016-05-17 LAB — URINE DRUG SCREEN, QUALITATIVE (ARMC ONLY)
AMPHETAMINES, UR SCREEN: NOT DETECTED
BENZODIAZEPINE, UR SCRN: NOT DETECTED
Barbiturates, Ur Screen: NOT DETECTED
COCAINE METABOLITE, UR ~~LOC~~: NOT DETECTED
Cannabinoid 50 Ng, Ur ~~LOC~~: NOT DETECTED
MDMA (ECSTASY) UR SCREEN: NOT DETECTED
METHADONE SCREEN, URINE: NOT DETECTED
OPIATE, UR SCREEN: NOT DETECTED
PHENCYCLIDINE (PCP) UR S: NOT DETECTED
Tricyclic, Ur Screen: NOT DETECTED

## 2016-05-17 LAB — CBC WITH DIFFERENTIAL/PLATELET
BASOS ABS: 0 10*3/uL (ref 0–0.1)
BASOS PCT: 0 %
Eosinophils Absolute: 0.1 10*3/uL (ref 0–0.7)
Eosinophils Relative: 1 %
HEMATOCRIT: 40 % (ref 35.0–47.0)
HEMOGLOBIN: 13.5 g/dL (ref 12.0–16.0)
LYMPHS PCT: 38 %
Lymphs Abs: 2.9 10*3/uL (ref 1.0–3.6)
MCH: 27.9 pg (ref 26.0–34.0)
MCHC: 33.8 g/dL (ref 32.0–36.0)
MCV: 82.6 fL (ref 80.0–100.0)
MONO ABS: 0.6 10*3/uL (ref 0.2–0.9)
Monocytes Relative: 8 %
Neutro Abs: 4 10*3/uL (ref 1.4–6.5)
Neutrophils Relative %: 53 %
Platelets: 325 10*3/uL (ref 150–440)
RBC: 4.84 MIL/uL (ref 3.80–5.20)
RDW: 14 % (ref 11.5–14.5)
WBC: 7.6 10*3/uL (ref 3.6–11.0)

## 2016-05-17 LAB — ETHANOL
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
## 2016-05-18 ENCOUNTER — Inpatient Hospital Stay
Admission: AD | Admit: 2016-05-18 | Discharge: 2016-05-26 | DRG: 885 | Disposition: A | Discharge status: Home / Self Care | Payer: BLUE CROSS/BLUE SHIELD | Source: Intra-hospital | Attending: Psychiatry | Admitting: Psychiatry

## 2016-05-18 DIAGNOSIS — F17290 Nicotine dependence, other tobacco product, uncomplicated: Secondary | ICD-10-CM | POA: Diagnosis present

## 2016-05-18 DIAGNOSIS — E221 Hyperprolactinemia: Secondary | ICD-10-CM | POA: Diagnosis present

## 2016-05-18 DIAGNOSIS — Z79899 Other long term (current) drug therapy: Secondary | ICD-10-CM

## 2016-05-18 DIAGNOSIS — Z9889 Other specified postprocedural states: Secondary | ICD-10-CM

## 2016-05-18 DIAGNOSIS — G4700 Insomnia, unspecified: Secondary | ICD-10-CM | POA: Diagnosis present

## 2016-05-18 DIAGNOSIS — Z560 Unemployment, unspecified: Secondary | ICD-10-CM | POA: Diagnosis not present

## 2016-05-18 DIAGNOSIS — F200 Paranoid schizophrenia: Principal | ICD-10-CM | POA: Diagnosis present

## 2016-05-18 DIAGNOSIS — F29 Unspecified psychosis not due to a substance or known physiological condition: Secondary | ICD-10-CM | POA: Diagnosis not present

## 2016-05-18 DIAGNOSIS — K5090 Crohn's disease, unspecified, without complications: Secondary | ICD-10-CM | POA: Diagnosis present

## 2016-05-18 MED ORDER — DIPHENHYDRAMINE HCL 50 MG/ML IJ SOLN
INTRAMUSCULAR | Status: AC
  Filled 2016-05-18: qty 1

## 2016-05-18 MED ORDER — ALUM & MAG HYDROXIDE-SIMETH 200-200-20 MG/5ML PO SUSP: 30.0000 mL | ORAL | Status: DC | PRN

## 2016-05-18 MED ORDER — LORAZEPAM 2 MG PO TABS
ORAL_TABLET | ORAL | Status: AC
  Administered 2016-05-18: 2 mg via ORAL
  Filled 2016-05-18: qty 1

## 2016-05-18 MED ORDER — RISPERIDONE 1 MG PO TBDP
2.0000 mg | ORAL_TABLET | Freq: Every day | ORAL | Status: DC
  Administered 2016-05-18: 2 mg via ORAL
  Filled 2016-05-18 (×2): qty 1

## 2016-05-18 MED ORDER — HALOPERIDOL LACTATE 5 MG/ML IJ SOLN: 5.0000 mg | Freq: Once | INTRAMUSCULAR | Status: DC

## 2016-05-18 MED ORDER — TRAZODONE HCL 100 MG PO TABS: 100.0000 mg | ORAL_TABLET | Freq: Every day | ORAL | Status: DC

## 2016-05-18 MED ORDER — LORAZEPAM 2 MG PO TABS
2.0000 mg | ORAL_TABLET | Freq: Once | ORAL | Status: AC
  Administered 2016-05-18: 2 mg via ORAL

## 2016-05-18 MED ORDER — TRAZODONE HCL 100 MG PO TABS
100.0000 mg | ORAL_TABLET | Freq: Every day | ORAL | Status: DC
  Administered 2016-05-18 – 2016-05-19 (×2): 100 mg via ORAL
  Filled 2016-05-18 (×4): qty 1

## 2016-05-18 MED ORDER — HALOPERIDOL LACTATE 5 MG/ML IJ SOLN
INTRAMUSCULAR | Status: AC
  Filled 2016-05-18: qty 1

## 2016-05-18 MED ORDER — LORAZEPAM 2 MG/ML IJ SOLN
2.0000 mg | Freq: Once | INTRAMUSCULAR | Status: AC
  Filled 2016-05-18: qty 1

## 2016-05-18 MED ORDER — ACETAMINOPHEN 325 MG PO TABS
650.0000 mg | ORAL_TABLET | Freq: Four times a day (QID) | ORAL | Status: DC | PRN
Start: 2016-05-18 — End: 2016-05-26
  Filled 2016-05-18: qty 2

## 2016-05-18 MED ORDER — RISPERIDONE 1 MG PO TBDP: 2.0000 mg | ORAL_TABLET | Freq: Every day | ORAL | Status: DC

## 2016-05-18 MED ORDER — DIPHENHYDRAMINE HCL 50 MG/ML IJ SOLN: 25.0000 mg | Freq: Once | INTRAMUSCULAR | Status: DC

## 2016-05-18 MED ORDER — MAGNESIUM HYDROXIDE 400 MG/5ML PO SUSP: 30.0000 mL | Freq: Every day | ORAL | Status: DC | PRN

## 2016-05-18 NOTE — Progress Notes (Signed)
Was asked to talk to this patient this am at 1040 when she asked to speak to the director of the unit who was unavailable at the time. Patient had complaints about staff "flip flopping" with her in the ED-she was going to be discharged, she was going to stay-yesterday. I explained the Involuntary Commitment process and how sometimes this process  takes time. She appeared to understand this. Then she was upset that staff had been "showing her needles" if she did not take her po meds and she was "scared" of needles.  When she  began to talk about President Trump and how he had said she could go home. At this point I ended our conversation and passed on report to the RN's in the [place].

## 2016-05-18 NOTE — BH Assessment (Signed)
Patient is to be admitted to [facility] by Dr. Weber Cooks.  Attending Physician will be Dr. Jerilee Hoh.   Patient has been assigned to room 320, by [facility] Charge Nurse Anderson Malta .   Intake Paper Work has been signed and placed on patient chart.  ER staff is aware of the admission (Dr. Archie Balboa, [place], Patient Access).

## 2016-05-18 NOTE — ED Notes (Signed)
Pt insisting on speaking with a supervisor to c/o about her care and why she was not released pt is on ivc.House supervisor ac came and spoke with pt

## 2016-05-18 NOTE — ED Notes (Signed)
[place], [place] [phone])

## 2016-05-18 NOTE — ED Notes (Signed)
Pt. To BHU from ED ambulatory without difficulty, to room  BHU 7. Report from [employer]. Pt. Is alert and oriented, warm and dry in no distress. Pt. Denies SI, HI, and AVH. Pt is pacing in unit, patient states, "I am to be discharged in a few minutes." This writer had advised patient she is not leaving right now and oriented her to the [place]. This Probation officer made the MD aware of patient verbal order received. Pt is refusing medication at this time.
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
## 2016-05-18 NOTE — ED Notes (Signed)
Patient took po ativan 2 mg at 0344. Patient continues to sit in day room stating she is to be discharged and we are holding her hostage that the president and doctor said she is to be discharged. This RN and fellow RN in unit have tried to re-orient patient. This Probation officer spoke to patient and encouraged patient to go in her room and rest. Patient states, "There is no point I am being discharged in just a few minutes." This writer advised patient, "I am not sure what time discharge it will be right now but for right now why don't you go to your room and relax." Patient refused.

## 2016-05-18 NOTE — ED Notes (Signed)
ENVIRONMENTAL ASSESSMENT  Potentially harmful objects out of patient reach: Yes.  Personal belongings secured: Yes.  Patient dressed in hospital provided attire only: Yes.  Plastic bags out of patient reach: Yes.  Patient care equipment (cords, cables, call bells, lines, and drains) shortened, removed, or accounted for: Yes.  Equipment and supplies removed from bottom of stretcher: Yes.  Potentially toxic materials out of patient reach: Yes.  Sharps container removed or out of patient reach: Yes.   BEHAVIORAL HEALTH ROUNDING  Patient sleeping: No.  Patient alert and oriented: yes  Behavior appropriate: Yes. ; If no, describe:  Nutrition and fluids offered: Yes  Toileting and hygiene offered: Yes  Sitter present: not applicable, Q 15 min safety rounds and observation via security camera. Law enforcement present: Yes ODS  ED [place]  Is the patient under IVC or is there intent for IVC: Yes.  Is the patient medically cleared: Yes.  Is there vacancy in the ED BHU: Yes.  Is the population mix appropriate for patient: Yes.  Is the patient awaiting placement in inpatient or outpatient setting: Yes.  Has the patient had a psychiatric consult: Yes.  Survey of unit performed for contraband, proper placement and condition of furniture, tampering with fixtures in bathroom, shower, and each patient room: Yes. ; Findings: All clear  APPEARANCE/BEHAVIOR  calm, cooperative and adequate rapport can be established  NEURO ASSESSMENT  Orientation: time, place and person  Hallucinations: No.None noted (Hallucinations)  Speech: Normal  Gait: normal  RESPIRATORY ASSESSMENT  WNL  CARDIOVASCULAR ASSESSMENT  WNL  GASTROINTESTINAL ASSESSMENT  WNL  EXTREMITIES  WNL  PLAN OF CARE  Provide calm/safe environment. Vital signs assessed twice daily. ED BHU Assessment once each 12-hour shift. Collaborate with TTS daily or as condition indicates. Assure the ED provider has rounded once  each shift. Provide and encourage hygiene. Provide redirection as needed. Assess for escalating behavior; address immediately and inform ED provider.  Assess family dynamic and appropriateness for visitation as needed: Yes. ; If necessary, describe findings:  Educate the patient/family about BHU procedures/visitation: Yes. ; If necessary, describe findings: Pt is calm and cooperative at this time. Pt understanding and accepting of unit procedures/rules. Pt contracts for safety with this RN at this time and denies SI/HI. Will continue to monitor with Q 15 min safety rounds and observation via security camera.
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
## 2016-05-18 NOTE — ED Notes (Signed)
Patient currently resting in day room chair with eyes closed. Will continue to monitor.
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
## 2016-05-18 NOTE — ED Notes (Signed)
Pt seen by Dr Weber Cooks and it was confirmed she will be adm to beh med under ivc she is very tearful and unhappy but no aggression  Noted. She also has started her menses

## 2016-05-18 NOTE — Progress Notes (Signed)
Admission Note:  35 yr female admitted to the unit, no acute distress noted. Skin assessed in company of [place] [facility]  ; skin is warm to touch, dry and intact and found to be clear of any abnormal marks Patient searched and no contraband found. Pt has no additional questions or concerns, will continue to monitor.

## 2016-05-19 LAB — LIPID PANEL
CHOLESTEROL: 186 mg/dL (ref 0–200)
HDL: 38 mg/dL — AB (ref >40)
LDL Cholesterol: 129 mg/dL — ABNORMAL HIGH (ref 0–99)
TRIGLYCERIDES: 96 mg/dL (ref <150)
Total CHOL/HDL Ratio: 4.9 RATIO
VLDL: 19 mg/dL (ref 0–40)

## 2016-05-19 LAB — PREGNANCY, URINE: PREG TEST UR: NEGATIVE

## 2016-05-19 LAB — URINALYSIS COMPLETE WITH MICROSCOPIC (ARMC ONLY)
Bacteria, UA: NONE SEEN
Bilirubin Urine: NEGATIVE
Glucose, UA: NEGATIVE mg/dL
KETONES UR: NEGATIVE mg/dL
Leukocytes, UA: NEGATIVE
Nitrite: NEGATIVE
PROTEIN: NEGATIVE mg/dL
Specific Gravity, Urine: 1.008 (ref 1.005–1.030)
pH: 6 (ref 5.0–8.0)

## 2016-05-19 LAB — TSH: TSH: 2.384 u[IU]/mL (ref 0.350–4.500)

## 2016-05-19 LAB — HEMOGLOBIN A1C: Hgb A1c MFr Bld: 5.5 % (ref 4.0–6.0)

## 2016-05-19 MED ORDER — PALIPERIDONE ER 3 MG PO TB24
6.0000 mg | ORAL_TABLET | Freq: Every day | ORAL | Status: DC
  Administered 2016-05-19 – 2016-05-20 (×2): 6 mg via ORAL
  Filled 2016-05-19 (×2): qty 2

## 2016-05-19 NOTE — Plan of Care (Signed)
Problem: Safety: Goal: Ability to remain free from injury will improve Outcome: Progressing Patient has remained free from injury this shift.  Patient provided with a safe environment.  Patient safety maintained with 15 minute checks.
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
## 2016-05-19 NOTE — Progress Notes (Addendum)
D:  Patient spent most of the day in her room.  Patient had minimal interaction but was appropriate to staff and peers.  Patient denies SI/AVH/HI. A:  Patient encouraged to attend groups.  Patient administered scheduled medication.  Patient educated on medication given. R:  Patient safety maintained with 15 minute checks.  Patient attended groups.

## 2016-05-19 NOTE — BHH Counselor (Deleted)
Adult Comprehensive Assessment  Patient ID: Talita Recht, female   DOB: 1980/09/29, 35 y.o.   MRN: [id number]  Information Source: Information source: Patient  Current Stressors:     Living/Environment/Situation:  Living Arrangements: Spouse/significant other  Family History:     Childhood History:     Education:     Employment/Work Situation:      Museum/gallery curator Resources:      Alcohol/Substance Abuse:      Social Support System:      Leisure/Recreation:      Strengths/Needs:      Discharge Plan:      Summary/Recommendations:   Architectural technologist and Recommendations (to be completed by the evaluator):  Patient is a separated 35 year old AA female admitted with a diagnosis of .Paranoid schizophrenia ([place]).  Pt denies any stressor currently.  Patient presented to the hopital with police after concerns by them over the pt's safety . Patient reports primary triggers for admission was being pulled over by police and concerns by police officers for the pt. Patient will discharge home to her ex-husband's house once stabilized on medications and will follow up at [facility] in [place] for outpatient mental health services. Patient will benefit from crisis stabilization, medication evaluation, group therapy and psycho education in addition to  case management for discharge planning. At discharge, it is recommended that patient remain compliant with established discharge plan and continued treatment.  Alphonse Guild Ramondo Dietze. 05/19/2016

## 2016-05-19 NOTE — BHH Suicide Risk Assessment (Signed)
[facility] Admission Suicide Risk Assessment   Nursing information obtained from:    Demographic factors:    Current Mental Status:    Loss Factors:    Historical Factors:    Risk Reduction Factors:     Total Time spent with patient: 1 hour Principal Problem: Paranoid schizophrenia ([place]) Diagnosis:   Patient Active Problem List   Diagnosis Date Noted  . Paranoid schizophrenia ([place]) [F20.0] 05/18/2016   Subjective Data:   Continued Clinical Symptoms:  Alcohol Use Disorder Identification Test Final Score (AUDIT): 0 The "Alcohol Use Disorders Identification Test", Guidelines for Use in Primary Care, Second Edition.  World Pharmacologist [facility]). Score between 0-7:  no or low risk or alcohol related problems. Score between 8-15:  moderate risk of alcohol related problems. Score between 16-19:  high risk of alcohol related problems. Score 20 or above:  warrants further diagnostic evaluation for alcohol dependence and treatment.   CLINICAL FACTORS:   Schizophrenia:   Command hallucinatons Less than 47 years old Currently Psychotic Previous Psychiatric Diagnoses and Treatments    Psychiatric Specialty Exam: Physical Exam  ROS  Blood pressure 121/65, pulse (!) 105, temperature 98 F (36.7 C), temperature source Oral, resp. rate 18, height 5' 5"  (1.651 m), weight 89.8 kg (198 lb), last menstrual period 04/14/2016, SpO2 100 %.Body mass index is 32.95 kg/m.                                                    Sleep:  Number of Hours: 5      COGNITIVE FEATURES THAT CONTRIBUTE TO RISK:  Loss of executive function    SUICIDE RISK:   Moderate:  Frequent suicidal ideation with limited intensity, and duration, some specificity in terms of plans, no associated intent, good self-control, limited dysphoria/symptomatology, some risk factors present, and identifiable protective factors, including available and accessible social support.   PLAN OF CARE: admit to  [facility]  I certify that inpatient services furnished can reasonably be expected to improve the patient's condition.  Hildred Priest, MD 05/19/2016, 2:53 PM

## 2016-05-19 NOTE — Plan of Care (Signed)
Problem: Safety: Goal: Ability to remain free from injury will improve Outcome: Progressing Adjusting well to the new environment, sanitary pads provided for menstrual period, decline Tylenol 650 mg PO PRN to ease menstrual related pain/cramps; "I don't take anything for my period". Medications administered as ordered by the physician, medications Therapeutic Effects, SEs and Adverse effects discussed, questions encouraged; no PRN given, 15 minute checks maintained for safety, clinical and moral support provided, patient encouraged to continue to express feelings and demonstrate safe care. Patient remains free from harm, will continue to monitor.

## 2016-05-19 NOTE — BHH Group Notes (Signed)
[facility] LCSW Group Therapy   05/19/2016 9:30 am   Type of Therapy: Group Therapy   Participation Level: Invited but did not attend.  Participation Quality: Invited but did not attend.   Glorious Peach, MSW, LCSW-A 05/19/2016, 11:56AM

## 2016-05-19 NOTE — Tx Team (Signed)
Initial Treatment Plan 05/19/2016 2:04 AM [name] UJN:[id number]    PATIENT STRESSORS: Medication change or noncompliance   PATIENT STRENGTHS: Capable of independent living Physical Health Supportive family/friends   PATIENT IDENTIFIED PROBLEMS: Bizarre & Odd Behaviors  Thoughts Disorganization                   DISCHARGE CRITERIA:  Improved stabilization in mood, thinking, and/or behavior Motivation to continue treatment in a less acute level of care Verbal commitment to aftercare and medication compliance  PRELIMINARY DISCHARGE PLAN: Return to previous living arrangement  PATIENT/FAMILY INVOLVEMENT: This treatment plan has been presented to and reviewed with the patient, Vicki Lee.  The patient have been given the opportunity to ask questions and make suggestions.  Electa Sniff, RN 05/19/2016, 2:04 AM
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
## 2016-05-19 NOTE — [facility] Group Notes (Signed)
Goals Group  Date/Time: 9:00AM Type of Therapy and Topic: Group Therapy: Goals Group: SMART Goals  ?  Participation Level: Moderate  ?  Description of Group:  ?  The purpose of a daily goals group is to assist and guide patients in setting recovery/wellness-related goals. The objective is to set goals as they relate to the crisis in which they were admitted. Patients will be using SMART goal modalities to set measurable goals. Characteristics of realistic goals will be discussed and patients will be assisted in setting and processing how one will reach their goal. Facilitator will also assist patients in applying interventions and coping skills learned in psycho-education groups to the SMART goal and process how one will achieve defined goal.  ?  Therapeutic Goals:  ?  -Patients will develop and document one goal related to or their crisis in which brought them into treatment.  -Patients will be guided by LCSW using SMART goal setting modality in how to set a measurable, attainable, realistic and time sensitive goal.  -Patients will process barriers in reaching goal.  -Patients will process interventions in how to overcome and successful in reaching goal.  ?  Patient's Goal: Patient invited but did not attend. ?  Therapeutic Modalities:  Motivational Interviewing  Public relations account executive Therapy  Crisis Intervention Model  SMART goals setting   Glorious Peach, MSW, LCSW-A

## 2016-05-19 NOTE — BHH Counselor (Deleted)
Adult Comprehensive Assessment  Patient ID: Vicki Lee, female   DOB: 1980-12-22, 35 y.o.   MRN: [id number]  Information Source: Information source: Patient  Current Stressors:     Living/Environment/Situation:  Living Arrangements: Spouse/significant other  Family History:     Childhood History:     Education:     Employment/Work Situation:      Museum/gallery curator Resources:      Alcohol/Substance Abuse:      Social Support System:      Leisure/Recreation:      Strengths/Needs:      Discharge Plan:      Summary/Recommendations:   Architectural technologist and Recommendations (to be completed by the evaluator): Patient is a separated 35 year old AA female admitted with a diagnosis of .Paranoid schizophrenia ([place]).  Pt denies any stressor currently.  Patient presented to the hopital with police after concerns by them over the pt's safety . Patient reports primary triggers for admission was being pulled over by police and concerns by police officers for the pt. Patient will discharge home to her ex-husband's house once stabilized on medications and will follow up at [facility] in [place] for outpatient mental health services. Patient will benefit from crisis stabilization, medication evaluation, group therapy and psycho education in addition to  case management for discharge planning. At discharge, it is recommended that patient remain compliant with established discharge plan and continued treatment.  Vicki Lee. 05/19/2016

## 2016-05-20 ENCOUNTER — Inpatient Hospital Stay: Payer: BLUE CROSS/BLUE SHIELD

## 2016-05-20 LAB — RAPID HIV SCREEN (HIV 1/2 AB+AG)
HIV 1/2 Antibodies: NONREACTIVE
HIV-1 P24 Antigen - HIV24: NONREACTIVE

## 2016-05-20 LAB — VITAMIN B12: Vitamin B-12: 482 pg/mL (ref 180–914)

## 2016-05-20 LAB — PROLACTIN: PROLACTIN: 171.3 ng/mL — AB (ref 4.8–23.3)

## 2016-05-20 MED ORDER — PALIPERIDONE ER 3 MG PO TB24
6.0000 mg | ORAL_TABLET | Freq: Every day | ORAL | Status: DC
  Administered 2016-05-21 – 2016-05-22 (×2): 6 mg via ORAL
  Filled 2016-05-20 (×2): qty 2

## 2016-05-20 MED ORDER — AMANTADINE HCL 100 MG PO CAPS
100.0000 mg | ORAL_CAPSULE | Freq: Two times a day (BID) | ORAL | Status: DC
  Administered 2016-05-20 – 2016-05-26 (×12): 100 mg via ORAL
  Filled 2016-05-20 (×12): qty 1

## 2016-05-20 NOTE — [facility] Group Notes (Signed)
[place] Group Notes:  (Nursing/MHT/Case Management/Adjunct)  Date:  05/20/2016  Time:  5:09 PM  Type of Therapy:  Psychoeducational Skills  Participation Level:  Did Not Attend  Charise Killian 05/20/2016, 5:09 PM

## 2016-05-20 NOTE — [facility] Group Notes (Signed)
[place] Group Notes:  (Nursing/MHT/Case Management/Adjunct)  Date:  05/20/2016  Time:  2:14 AM  Type of Therapy:  Group Therapy  Participation Level:  Did Not Attend    Vicki Lee 05/20/2016, 2:14 AM

## 2016-05-20 NOTE — Plan of Care (Signed)
Problem: Coping: Goal: Ability to verbalize feelings will improve Outcome: Progressing Patient verbalized feelings to Probation officer.
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
## 2016-05-20 NOTE — Tx Team (Signed)
Interdisciplinary Treatment and Diagnostic Plan Update  05/20/2016 Time of Session: 11:05 AM  Vicki Lee MRN: [id number]  Principal Diagnosis: Paranoid schizophrenia [facility])  Secondary Diagnoses: Principal Problem:   Paranoid schizophrenia ([place])   Current Medications:  Current Facility-Administered Medications  Medication Dose Route Frequency Provider Last Rate Last Dose  . acetaminophen (TYLENOL) tablet 650 mg  650 mg Oral Q6H PRN Gonzella Lex, MD      . alum & mag hydroxide-simeth (MAALOX/MYLANTA) 200-200-20 MG/5ML suspension 30 mL  30 mL Oral Q4H PRN Gonzella Lex, MD      . magnesium hydroxide (MILK OF MAGNESIA) suspension 30 mL  30 mL Oral Daily PRN Gonzella Lex, MD      . paliperidone (INVEGA) 24 hr tablet 6 mg  6 mg Oral Daily Hildred Priest, MD   6 mg at 05/20/16 0836  . traZODone (DESYREL) tablet 100 mg  100 mg Oral QHS Gonzella Lex, MD   100 mg at 05/19/16 2142    PTA Medications: Prescriptions Prior to Admission  Medication Sig Dispense Refill Last Dose  . diphenhydrAMINE (BENADRYL) 50 MG capsule Take 50 mg by mouth every 6 (six) hours as needed for sleep.   Past Month at Unknown time  . Multiple Vitamin (MULTIVITAMIN WITH MINERALS) TABS tablet Take 1 tablet by mouth daily.   01/05/2016 at Unknown time  . risperiDONE (RISPERDAL) 2 MG tablet Take 1 tablet (2 mg total) by mouth at bedtime. 30 tablet 0   . traZODone (DESYREL) 100 MG tablet Take 1 tablet (100 mg total) by mouth at bedtime. 30 tablet 0     Treatment Modalities: Medication Management, Group therapy, Case management,  1 to 1 session with clinician, Psychoeducation, Recreational therapy.   Physician Treatment Plan for Primary Diagnosis: Paranoid schizophrenia ([place]) Long Term Goal(s): Improvement in symptoms so as ready for discharge  Short Term Goals: Ability to verbalize feelings will improve and Compliance with prescribed medications will improve  Medication Management: Evaluate  patient's response, side effects, and tolerance of medication regimen.  Therapeutic Interventions: 1 to 1 sessions, Unit Group sessions and Medication administration.  Evaluation of Outcomes: Progressing  Physician Treatment Plan for Secondary Diagnosis: Principal Problem:   Paranoid schizophrenia ([place])   Long Term Goal(s): Improvement in symptoms so as ready for discharge  Short Term Goals: Ability to verbalize feelings will improve and Compliance with prescribed medications will improve  Medication Management: Evaluate patient's response, side effects, and tolerance of medication regimen.  Therapeutic Interventions: 1 to 1 sessions, Unit Group sessions and Medication administration.  Evaluation of Outcomes: Progressing   RN Treatment Plan for Primary Diagnosis: Paranoid schizophrenia ([place]) Long Term Goal(s): Knowledge of disease and therapeutic regimen to maintain health will improve  Short Term Goals: Ability to participate in decision making will improve  Medication Management: RN will administer medications as ordered by provider, will assess and evaluate patient's response and provide education to patient for prescribed medication. RN will report any adverse and/or side effects to prescribing provider.  Therapeutic Interventions: 1 on 1 counseling sessions, Psychoeducation, Medication administration, Evaluate responses to treatment, Monitor vital signs and CBGs as ordered, Perform/monitor CIWA, COWS, AIMS and Fall Risk screenings as ordered, Perform wound care treatments as ordered.  Evaluation of Outcomes: Progressing   LCSW Treatment Plan for Primary Diagnosis: Paranoid schizophrenia ([place]) Long Term Goal(s): Safe transition to appropriate next level of care at discharge, Engage patient in therapeutic group addressing interpersonal concerns.  Short Term Goals: Engage patient in aftercare planning  with referrals and resources, Increase social support, Increase ability to  appropriately verbalize feelings, Increase emotional regulation, Facilitate acceptance of mental health diagnosis and concerns and Increase skills for wellness and recovery  Therapeutic Interventions: Assess for all discharge needs, 1 to 1 time with Social worker, Explore available resources and support systems, Assess for adequacy in community support network, Educate family and significant other(s) on suicide prevention, Complete Psychosocial Assessment, Interpersonal group therapy.  Evaluation of Outcomes: Progressing   Progress in Treatment: Attending groups: No Participating in groups: No Taking medication as prescribed: Yes, MD continues to assess for medication changes as needed Toleration medication: Yes, no side effects reported at this time Family/Significant other contact made: CSW, still assessing for appropriate contacts Patient understands diagnosis: Yes Discussing patient identified problems/goals with staff: Yes Medical problems stabilized or resolved:  Yes   Denies suicidal/homicidal ideation: Yes Issues/concerns per patient self-inventory: None Other: N/A  New problem(s) identified: None identified at this time.   New Short Term/Long Term Goal(s): None identified at this time.   Discharge Plan or Barriers: Pt will discharge [facility] to [facility] and will follow up with [facility] for medication management and therapy   Reason for Continuation of Hospitalization: Anxiety Depression Hallucinations Delusions    Estimated Length of Stay: 3-5 days   Attendees: Patient: Vicki Lee 05/20/2016  10:40 AM  Physician: Dr. Jerilee Hoh, MD 05/20/2016  10:40 AM  Nursing: Floyde Parkins, RN 05/20/2016  10:40 AM  Social Worker: Alphonse Guild. [name] 05/20/2016  10:40 AM  Social Worker: Glorious Peach LCSWA 05/20/2016  10:40 AM  Recreational Therapist: Everitt Amber, LRT 05/20/2016  10:40 AM  P.A. Student: Franchot Heidelberg 05/20/2016  10:40 AM   05/20/2016  10:40 AM  Other: 05/20/2016   10:40 AM    Scribe for Treatment Team:  Alphonse Guild. Marialice Newkirk MSW, LCSWA, LCAS

## 2016-05-20 NOTE — Progress Notes (Signed)
Recreation Therapy Notes  Date: 09.08.17 Time: 9:30 am Location: Craft Room  Group Topic: Coping Skills  Goal Area(s) Addresses:  Patient will participate in healthy coping skill. Patient will verbalize benefit of using art as a coping skill.  Behavioral Response: Did not attend   Intervention: Coloring  Activity: Patients were given coloring sheets to color.  Education: LRT educated patients on healthy coping skills.  Education Outcome: Patient did not attend group.  Clinical Observations/Feedback: Patient did not attend group.  Leonette Monarch, LRT/CTRS 05/20/2016 10:26 AM

## 2016-05-20 NOTE — Progress Notes (Signed)
Patient noted to be flat and blunt in affect. Patient denied SI but when asked about having any hallucinations, she walked away without responding any further. She stayed in her room most the time, appeared unkempt and minimally interacted with her peers. She however is cooperative with ward routine. Medication education done and encouraged to attend grups, she verbalized understanding.

## 2016-05-20 NOTE — Plan of Care (Signed)
Problem: Self-Concept: Goal: Ability to verbalize positive feelings about self will improve Outcome: Not Progressing Patient noted to be thought blocking.

## 2016-05-20 NOTE — Progress Notes (Signed)
D: Pt denies SI/HI/AVH. Pt is pleasant and cooperative with treatment plan. Patient's affect is blunted, she paced during the shift and appeared restless but less anxious. Patient is not interacting with peers and staff appropriately.  A: Pt was offered support and encouragement. Pt was given offered medications. Pt was encouraged to attend groups. Q 15 minute checks were done for safety.  R:Pt did not attend evening group. Pt is complaint with  medication. Pt has no complaints.Pt receptive to treatment and safety maintained on unit.
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
## 2016-05-20 NOTE — BHH Group Notes (Signed)
[place] LCSW Group Therapy   05/20/2016 1:00 PM   Type of Therapy: Group Therapy   Participation Level: Active   Participation Quality: Attentive, Sharing and Supportive   Affect: Appropriate   Cognitive: Alert and Oriented   Insight: Developing/Improving and Engaged   Engagement in Therapy: Developing/Improving and Engaged   Modes of Intervention: Clarification, Confrontation, Discussion, Education, Exploration, Limit-setting, Orientation, Problem-solving, Rapport Building, Art therapist, Socialization and Support   Summary of Progress/Problems: The topic for today was feelings about relapse. Pt discussed what relapse prevention is to them and identified triggers that they are on the path to relapse. Pt processed their feeling towards relapse and was able to relate to peers. Pt discussed coping skills that can be used for relapse prevention. Pt identifies anxiety and fear as triggers for relapse. She stated in order to stay in recovery and prevent relapse, she will continue to exercise, eat healthy, and consult a trustworthy friend or family member.   Glorious Peach, MSW, Latanya Presser

## 2016-05-21 DIAGNOSIS — F200 Paranoid schizophrenia: Principal | ICD-10-CM

## 2016-05-21 LAB — RPR: RPR Ser Ql: NONREACTIVE

## 2016-05-21 NOTE — Progress Notes (Signed)
[facility] MD Progress Note  05/21/2016 11:37 AM Vicki Lee  MRN:  [id number] Subjective:  Vicki Lee is a 35 y/o F presenting to the ED  under petition for concerns about auditory hallucinations and abnormal behavior, as she was found in her hot car with her dog and refused to roll down the window for the police. She has a h/o admission to our unit for psychosis on 01/06/16-01/08/16. She was discharged with a diagnosis of delusional disorder and was prescribed with Risperdal 2 mg by mouth daily at bedtime.  Minimally communicative. Inappropriate affect when discussing death of her dog. Nurses reported thought blocking.  Withdrawn to room, in bed since admission, poor grooming.   Pt denies SI, HI or hallucinations. Denies depression or major issues with sleep, appetite, energy or concentration. Denies having side effects or physical complaints.   Per nurses: D: Pt denies SI/HI/AVH. Pt is pleasant and cooperative with treatment plan. Patient's affect is blunted, she paced during the shift and appeared restless but less anxious. Patient is not interacting with peers and staff appropriately.  A: Pt was offered support and encouragement. Pt was given offered medications. Pt was encouraged to attend groups. Q 15 minute checks were done for safety.  R:Pt did not attend evening group. Pt is complaint with  medication. Pt has no complaints.Pt receptive to treatment and safety maintained on unit.  05/21/2016 Vicki Lee is pleasant polite and is pleasant polite and cooperative. She denies any symptoms of depression, anxiety or psychosis. She is not suicidal or homicidal. She is very vague in her answers. She secluded to her room. There are no somatic complaints. She reports good sleep and appetite. She does not report any side effects from medications. Head CT scan, RPR, and HIV testing, came back negative. We continue Invaga.  Principal Problem: Paranoid schizophrenia ([place]) Diagnosis:   Patient Active  Problem List   Diagnosis Date Noted  . Paranoid schizophrenia ([place]) [F20.0] 05/18/2016   Total Time spent with patient: 30 minutes  Past Psychiatric History: Saw a psychologist during college for "sexual thoughts," but was not put on medications. Admitted to [facility] in April for psychotic symptoms, and was discharged on risperidone. She denies any suicidal thought or intentions currently or in the past.    Past Medical History:      Past Medical History:  Diagnosis Date  . Crohn disease [facility])          Past Surgical History:  Procedure Laterality Date  . COLON SURGERY    . EYE SURGERY     Family History: History reviewed. No pertinent family history.   Family Psychiatric  History: Patient denies. No suicide attempts in the family. No substance abuse in the family.   Tobacco Screening: Have you used any form of tobacco in the last 30 days? (Cigarettes, Smokeless Tobacco, Cigars, and/or Pipes): No (Patient reported that she has never smoked.)   Social History:  Patient lives with her ex-husband in either [place] or [place]. She states that she has a fiance that she has known for a year. She does not have any kids. She is unemployed, but previously worked in [employer] until 2014, and at [employer] until 2015. She left work after being let go. She currently has no source of income.  She denies any criminal or legal problems.    History  Alcohol Use No    Comment: occ     History  Drug Use No    Social History  Social History  . Marital status: Married    Spouse name: N/A  . Number of children: N/A  . Years of education: N/A   Social History Main Topics  . Smoking status: Never Smoker  . Smokeless tobacco: Current User  . Alcohol use No     Comment: occ  . Drug use: No  . Sexual activity: Yes    Birth control/ protection: None   Other Topics Concern  . None   Social History Narrative  . None     Current Medications: Current  Facility-Administered Medications  Medication Dose Route Frequency Provider Last Rate Last Dose  . acetaminophen (TYLENOL) tablet 650 mg  650 mg Oral Q6H PRN Gonzella Lex, MD      . alum & mag hydroxide-simeth (MAALOX/MYLANTA) 200-200-20 MG/5ML suspension 30 mL  30 mL Oral Q4H PRN Gonzella Lex, MD      . amantadine (SYMMETREL) capsule 100 mg  100 mg Oral BID Hildred Priest, MD   100 mg at 05/21/16 0928  . magnesium hydroxide (MILK OF MAGNESIA) suspension 30 mL  30 mL Oral Daily PRN Gonzella Lex, MD      . paliperidone (INVEGA) 24 hr tablet 6 mg  6 mg Oral QHS Hildred Priest, MD      . traZODone (DESYREL) tablet 100 mg  100 mg Oral QHS Gonzella Lex, MD   100 mg at 05/19/16 2142    Lab Results:  Results for orders placed or performed during the hospital encounter of 05/18/16 (from the past 48 hour(s))  Rapid HIV screen (HIV 1/2 Ab+Ag)     Status: None   Collection Time: 05/20/16  1:27 PM  Result Value Ref Range   HIV-1 P24 Antigen - HIV24 NON REACTIVE NON REACTIVE   HIV 1/2 Antibodies NON REACTIVE NON REACTIVE   Interpretation (HIV Ag Ab)      A non reactive test result means that HIV 1 or HIV 2 antibodies and HIV 1 p24 antigen were not detected in the specimen.  RPR     Status: None   Collection Time: 05/20/16  1:27 PM  Result Value Ref Range   RPR Ser Ql Non Reactive Non Reactive    Comment: (NOTE) Performed At: [facility] [place], [place] [id number] Lindon Romp MD [id number]   Vitamin B12     Status: None   Collection Time: 05/20/16  1:27 PM  Result Value Ref Range   Vitamin B-12 482 180 - 914 pg/mL    Comment: (NOTE) This assay is not validated for testing neonatal or myeloproliferative syndrome specimens for Vitamin B12 levels. Performed at [facility]     Blood Alcohol level:  Lab Results  Component Value Date   [facility] <5 05/17/2016   ETH <5 48/09/6551    Metabolic Disorder Labs: Lab Results   Component Value Date   HGBA1C 5.5 05/19/2016   Lab Results  Component Value Date   PROLACTIN 171.3 (H) 05/19/2016   PROLACTIN 159.4 (H) 01/07/2016   Lab Results  Component Value Date   CHOL 186 05/19/2016   TRIG 96 05/19/2016   HDL 38 (L) 05/19/2016   CHOLHDL 4.9 05/19/2016   VLDL 19 05/19/2016   LDLCALC 129 (H) 05/19/2016   LDLCALC 121 (H) 01/07/2016    Physical Findings: AIMS:  , ,  ,  ,    CIWA:    COWS:     Musculoskeletal: Strength & Muscle Tone: within normal limits Gait &  Station: normal Patient leans: N/A  Psychiatric Specialty Exam: Physical Exam  Nursing note and vitals reviewed.   Review of Systems  Neurological: Negative.   Endo/Heme/Allergies: Negative.   Psychiatric/Behavioral: Negative for depression, hallucinations, memory loss, substance abuse and suicidal ideas. The patient is not nervous/anxious and does not have insomnia.   All other systems reviewed and are negative.   Blood pressure 119/78, pulse 99, temperature 98.6 F (37 C), temperature source Oral, resp. rate 18, height 5' 5"  (1.651 m), weight 89.8 kg (198 lb), last menstrual period 04/14/2016, SpO2 100 %.Body mass index is 32.95 kg/m.  General Appearance: Disheveled  Eye Contact:  Fair  Speech:  Clear and Coherent  Volume:  Normal  Mood:  Irritable  Affect:  Constricted  Thought Process:  Linear and Descriptions of Associations: Loose  Orientation:  Full (Time, Place, and Person)  Thought Content:  Hallucinations: Auditory  Suicidal Thoughts:  No  Homicidal Thoughts:  No  Memory:  Immediate;   Poor Recent;   Poor Remote;   Poor  Judgement:  Impaired  Insight:  Lacking  Psychomotor Activity:  Decreased  Concentration:  Concentration: Poor and Attention Span: Poor  Recall:  Poor  Fund of Knowledge:  Poor  Language:  Fair  Akathisia:  No  Handed:    AIMS (if indicated):     Assets:  Armed forces logistics/support/administrative officer Physical Health  ADL's:  Intact  Cognition:  WNL  Sleep:  Number of  Hours: 5.45     Treatment Plan Summary:  Paranoid Schizophrenia: Patient had made several odd comments to various providers. She has reported delusion and hallucinations.  She was diagnosed with delusional disorder a few months ago.  She did not take medications after discharge from our unit and did not follow up.   Patients dog, who was in car the with her prior to admission, has died.  She had inapropriate affect when we discussed it yesterday.  She had a grin in her face and said she had pictures of her dog.   Insomia: continue trazodone 100 mg PO qhs to help with sleep. Pt only slept 7 hours last night.   Hyperprolactinemia: will order amantadine 100 mg po bid.   Safety checks q15 minutes.  Vital signs daily.  Regular Diet.  Disposition: patient will return to ex-husband's home after discharge.   Hospitalization status: continue IVC  Patient is unreliable historian and we will try to contact family for collateral information.  Labs TSH, lipid panel and hemoglobin A1c are all wnl.  Prolactin is elevated.  I will order HIV, RPR, B12 and  head CT to r/o organic causes for psychosis.  Orson Slick, MD 05/21/2016, 11:37 AM

## 2016-05-21 NOTE — Progress Notes (Signed)
Affect blunted. Denies SI/HI/AVH.  Denies depression.  States only that she is here because police did not understand what she was saying.  Not forthcoming with information.  No interaction noted with peers, isolates to self.  Poor hygiene.  Good appetite.  Support and encouragement offered.  Safety maintained.

## 2016-05-21 NOTE — Progress Notes (Signed)
D:Pt did not participate in assessment other than to deny SI. Attended evening group. Refused evening Trazodone. Disheveled appearance.  A: Encouragement and support provided. q15 minute checks maintained for safety.  R: Remains safe on unit. Voices no additional concerns at this time.

## 2016-05-21 NOTE — BHH Group Notes (Signed)
[facility] Group Notes:  (Nursing/MHT/Case Management/Adjunct)  Date:  05/21/2016  Time:  1:49 AM  Type of Therapy:  Psychoeducational Skills  Participation Level:  Active  Participation Quality:  Appropriate  Affect:  Appropriate  Cognitive:  Alert  Insight:  Good  Engagement in Group:  Engaged  Modes of Intervention:  Activity  Summary of Progress/Problems:  Nehemiah Settle 05/21/2016, 1:49 AM

## 2016-05-21 NOTE — Plan of Care (Signed)
Problem: Coping: Goal: Ability to verbalize feelings will improve Outcome: Not Progressing Not forthcoming with information.  Verbalizes that she is here because "The police did not understand what I was saying. "

## 2016-05-22 NOTE — BHH Group Notes (Signed)
[facility] [place] Group Notes:  (Nursing/MHT/Case Management/Adjunct)  Date:  05/22/2016  Time:  5:36 AM  Type of Therapy:  Psychoeducational Skills  Participation Level:  Active  Participation Quality:  Appropriate and Attentive  Affect:  Appropriate  Cognitive:  Appropriate  Insight:  Appropriate  Engagement in Group:  Engaged  Modes of Intervention:  Discussion, Socialization and Support  Summary of Progress/Problems:  Reece Agar 05/22/2016, 5:36 AM

## 2016-05-22 NOTE — Progress Notes (Signed)
[facility] MD Progress Note  05/22/2016 4:01 PM Vicki Lee  MRN:  [id number] Subjective:  Ms. Ferrero is a 35 y/o F presenting to the ED  under petition for concerns about auditory hallucinations and abnormal behavior, as she was found in her hot car with her dog and refused to roll down the window for the police. She has a h/o admission to our unit for psychosis on 01/06/16-01/08/16. She was discharged with a diagnosis of delusional disorder and was prescribed with Risperdal 2 mg by mouth daily at bedtime.  Minimally communicative. Inappropriate affect when discussing death of her dog. Nurses reported thought blocking.  Withdrawn to room, in bed since admission, poor grooming.   Pt denies SI, HI or hallucinations. Denies depression or major issues with sleep, appetite, energy or concentration. Denies having side effects or physical complaints.   Per nurses: D: Pt denies SI/HI/AVH. Pt is pleasant and cooperative with treatment plan. Patient's affect is blunted, she paced during the shift and appeared restless but less anxious. Patient is not interacting with peers and staff appropriately.  A: Pt was offered support and encouragement. Pt was given offered medications. Pt was encouraged to attend groups. Q 15 minute checks were done for safety.  R:Pt did not attend evening group. Pt is complaint with  medication. Pt has no complaints.Pt receptive to treatment and safety maintained on unit.  05/21/2016 Ms. Delano is pleasant polite and is pleasant polite and cooperative. She denies any symptoms of depression, anxiety or psychosis. She is not suicidal or homicidal. She is very vague in her answers. She secluded to her room. There are no somatic complaints. She reports good sleep and appetite. She does not report any side effects from medications. Head CT scan, RPR, and HIV testing, came back negative. We continue Invega.  05/22/2016 Ms. Ungar feels much better today and denies any hallucinations. Her  mood is improving, affect is brighter. She denies suicidal or homicidal ideation but has not been coming out of her room or participating in programming. There are no somatic complaints. Sleep and appetite are fair.  Principal Problem: Paranoid schizophrenia ([place]) Diagnosis:   Patient Active Problem List   Diagnosis Date Noted  . Paranoid schizophrenia ([place]) [F20.0] 05/18/2016   Total Time spent with patient: 30 minutes  Past Psychiatric History: Saw a psychologist during college for "sexual thoughts," but was not put on medications. Admitted to [facility] in April for psychotic symptoms, and was discharged on risperidone. She denies any suicidal thought or intentions currently or in the past.    Past Medical History:      Past Medical History:  Diagnosis Date  . Crohn disease [facility])          Past Surgical History:  Procedure Laterality Date  . COLON SURGERY    . EYE SURGERY     Family History: History reviewed. No pertinent family history.   Family Psychiatric  History: Patient denies. No suicide attempts in the family. No substance abuse in the family.   Tobacco Screening: Have you used any form of tobacco in the last 30 days? (Cigarettes, Smokeless Tobacco, Cigars, and/or Pipes): No (Patient reported that she has never smoked.)   Social History:  Patient lives with her ex-husband in either [place] or [place]. She states that she has a fiance that she has known for a year. She does not have any kids. She is unemployed, but previously worked in [employer] until 2014, and at [employer] until 2015. She left work  after being let go. She currently has no source of income.  She denies any criminal or legal problems.    History  Alcohol Use No    Comment: occ     History  Drug Use No    Social History   Social History  . Marital status: Married    Spouse name: N/A  . Number of children: N/A  . Years of education: N/A   Social History Main Topics  .  Smoking status: Never Smoker  . Smokeless tobacco: Current User  . Alcohol use No     Comment: occ  . Drug use: No  . Sexual activity: Yes    Birth control/ protection: None   Other Topics Concern  . None   Social History Narrative  . None     Current Medications: Current Facility-Administered Medications  Medication Dose Route Frequency Provider Last Rate Last Dose  . acetaminophen (TYLENOL) tablet 650 mg  650 mg Oral Q6H PRN Gonzella Lex, MD      . alum & mag hydroxide-simeth (MAALOX/MYLANTA) 200-200-20 MG/5ML suspension 30 mL  30 mL Oral Q4H PRN Gonzella Lex, MD      . amantadine (SYMMETREL) capsule 100 mg  100 mg Oral BID Hildred Priest, MD   100 mg at 05/22/16 0926  . magnesium hydroxide (MILK OF MAGNESIA) suspension 30 mL  30 mL Oral Daily PRN Gonzella Lex, MD      . paliperidone (INVEGA) 24 hr tablet 6 mg  6 mg Oral QHS Hildred Priest, MD   6 mg at 05/21/16 2148  . traZODone (DESYREL) tablet 100 mg  100 mg Oral QHS Gonzella Lex, MD   100 mg at 05/19/16 2142    Lab Results:  No results found for this or any previous visit (from the past 48 hour(s)).  Blood Alcohol level:  Lab Results  Component Value Date   ETH <5 05/17/2016   ETH <5 78/67/6720    Metabolic Disorder Labs: Lab Results  Component Value Date   HGBA1C 5.5 05/19/2016   Lab Results  Component Value Date   PROLACTIN 171.3 (H) 05/19/2016   PROLACTIN 159.4 (H) 01/07/2016   Lab Results  Component Value Date   CHOL 186 05/19/2016   TRIG 96 05/19/2016   HDL 38 (L) 05/19/2016   CHOLHDL 4.9 05/19/2016   VLDL 19 05/19/2016   LDLCALC 129 (H) 05/19/2016   LDLCALC 121 (H) 01/07/2016    Physical Findings: AIMS:  , ,  ,  ,    CIWA:    COWS:     Musculoskeletal: Strength & Muscle Tone: within normal limits Gait & Station: normal Patient leans: N/A  Psychiatric Specialty Exam: Physical Exam  Nursing note and vitals reviewed.   Review of Systems  Neurological:  Negative.   Endo/Heme/Allergies: Negative.   Psychiatric/Behavioral: Negative for depression, hallucinations, memory loss, substance abuse and suicidal ideas. The patient is not nervous/anxious and does not have insomnia.   All other systems reviewed and are negative.   Blood pressure 125/75, pulse 84, temperature 98.2 F (36.8 C), resp. rate 18, height 5' 5"  (1.651 m), weight 89.8 kg (198 lb), last menstrual period 04/14/2016, SpO2 100 %.Body mass index is 32.95 kg/m.  General Appearance: Disheveled  Eye Contact:  Fair  Speech:  Clear and Coherent  Volume:  Normal  Mood:  Irritable  Affect:  Constricted  Thought Process:  Linear and Descriptions of Associations: Loose  Orientation:  Full (Time, Place, and Person)  Thought Content:  Hallucinations: Auditory  Suicidal Thoughts:  No  Homicidal Thoughts:  No  Memory:  Immediate;   Poor Recent;   Poor Remote;   Poor  Judgement:  Impaired  Insight:  Lacking  Psychomotor Activity:  Decreased  Concentration:  Concentration: Poor and Attention Span: Poor  Recall:  Poor  Fund of Knowledge:  Poor  Language:  Fair  Akathisia:  No  Handed:    AIMS (if indicated):     Assets:  Armed forces logistics/support/administrative officer Physical Health  ADL's:  Intact  Cognition:  WNL  Sleep:  Number of Hours: 6.15     Treatment Plan Summary:  Paranoid Schizophrenia: Patient had made several odd comments to various providers. She has reported delusion and hallucinations.  She was diagnosed with delusional disorder a few months ago.  She did not take medications after discharge from our unit and did not follow up.   Patients dog, who was in car the with her prior to admission, has died.  She had inapropriate affect when we discussed it yesterday.  She had a grin in her face and said she had pictures of her dog.   Insomia: continue trazodone 100 mg PO qhs to help with sleep. Pt only slept 7 hours last night.   Hyperprolactinemia: will order amantadine 100 mg po bid.    Safety checks q15 minutes.  Vital signs daily.  Regular Diet.  Disposition: patient will return to ex-husband's home after discharge.   Hospitalization status: continue IVC  Patient is unreliable historian and we will try to contact family for collateral information.  Labs TSH, lipid panel and hemoglobin A1c are all wnl.  Prolactin is elevated.  I will order HIV, RPR, B12 and  head CT to r/o organic causes for psychosis.  Orson Slick, MD 05/22/2016, 4:01 PM

## 2016-05-22 NOTE — Plan of Care (Signed)
Problem: Self-Concept: Goal: Ability to verbalize positive feelings about self will improve Outcome: Not Progressing Not forthcoming with information.  Will answer yes and no questions only.

## 2016-05-22 NOTE — Progress Notes (Addendum)
D: Pt denies SI. Refused Trazodone but was compliant with Invega. Affect flat. Pt did come to the nurses station after bed time and said that her husband was coming to pick her up. Pt appeared confused. Redirected back to her room. Appearance disheveled. Cooperative. A: Encouragement and support provided. q15 minute checks maintained for safety.  R: Remains safe on unit. Remains isolative and forwards little. Voices no additional concerns at this time.

## 2016-05-22 NOTE — Progress Notes (Signed)
Affect flat.  Denies SI/HI/AVh.  Minimal interaction with peers and staff.  Scheduled medications given with education.  Support and encouragement offered,  Safety maintained.

## 2016-05-22 NOTE — BHH Group Notes (Signed)
[facility] LCSW Group Therapy Note  05/22/2016 1:00pm  Type of Therapy and Topic: Group Therapy: Holding onto Grudges   Participation Level: Minimal  Description of Group:  In this group patients will be asked to explore and define a grudge. Patients will be guided to discuss their thoughts, feelings, and behaviors as to why one holds on to grudges and reasons why people have grudges. Patients will process the impact grudges have on daily life and identify thoughts and feelings related to holding on to grudges. Facilitator will challenge patients to identify ways of letting go of grudges and the benefits once released. Patients will be confronted to address why one struggles letting go of grudges. Lastly, patients will identify feelings and thoughts related to what life would look like without grudges and actions steps that patients can take to begin to let go of the grudge. This group will be process-oriented, with patients participating in exploration of their own experiences as well as giving and receiving support and challenge from other group members.    Therapeutic Goals:  1. Patient will identify specific grudges related to their personal life.  2. Patient will identify feelings, thoughts, and beliefs around grudges.  3. Patient will identify how one releases grudges appropriately.  4. Patient will identify situations where they could have let go of the grudge, but instead chose to hold on.    Summary of Patient Progress: Pt arrived late to group and did not participate in discussion.   Therapeutic Modalities:  Cognitive Behavioral Therapy  Solution Focused Therapy  Motivational Interviewing  Brief Therapy    Peri Maris, [place] 05/22/2016 1:04 PM

## 2016-05-22 NOTE — Progress Notes (Signed)
D:Pt did not participate in assessment other than to deny SI. Attended evening group.  Pt had first dose of Invega. Refused evening Trazodone.  Spent time in room reading bible. Disheveled appearance.  A: Encouragement and support provided. q15 minute checks maintained for safety.  R: Remains safe on unit. Voices no additional concerns at this time.

## 2016-05-23 MED ORDER — PALIPERIDONE ER 3 MG PO TB24
9.0000 mg | ORAL_TABLET | Freq: Every day | ORAL | Status: DC
  Administered 2016-05-23 – 2016-05-25 (×3): 9 mg via ORAL
  Filled 2016-05-23 (×3): qty 3

## 2016-05-23 NOTE — Plan of Care (Signed)
Problem: Activity: Goal: Risk for activity intolerance will decrease Outcome: Progressing Patient has walked back and forth to day room throughout shift. She also attended groups and snack time.

## 2016-05-23 NOTE — Progress Notes (Signed)
D:  Patient denies SI.  Patient does not endorse AVH at this time.  Patient has had minimal interaction with staff and peers. A:  Patient encouraged to attend group sessions.  Patient offered support and encouragement.  Patient administered scheduled medications. R:  Patient safety maintained with 15 minute checks.  Patient attended a group session.

## 2016-05-23 NOTE — Plan of Care (Signed)
Problem: Activity: Goal: Will verbalize the importance of balancing activity with adequate rest periods Outcome: Progressing Patient is staying out of bed for longer periods of time and is engaging, minimally, with staff and peers.  Patient attended group sessions and was appropriate and active in those sessions.

## 2016-05-23 NOTE — BHH Counselor (Deleted)
Adult Comprehensive Assessment  Patient ID: Mahalia Dykes, female   DOB: July 22, 1981, 35 y.o.   MRN: [id number]  Information Source: Information source: Patient  Current Stressors:     Living/Environment/Situation:  Living Arrangements: Spouse/significant other  Family History:     Childhood History:     Education:     Employment/Work Situation:      Museum/gallery curator Resources:      Alcohol/Substance Abuse:      Social Support System:      Leisure/Recreation:      Strengths/Needs:      Discharge Plan:      Summary/Recommendations:   Architectural technologist and Recommendations (to be completed by the evaluator): Patient is a 35 year old AA female admitted with a diagnosis of Paranoid schizophrenia ([place]).  Pt denies any stressor currently.  Patient presented to the hopital with police after concerns by them over the pt's safety . Patient reports primary triggers for admission was being pulled over by police and concerns by police officers for the pt. Patient will discharge home to her ex-husband's house once stabilized on medications and will follow up at [facility] in [place] for outpatient mental health services. Patient will benefit from crisis stabilization, medication evaluation, group therapy and psycho education in addition to  case management for discharge planning. At discharge, it is recommended that patient remain compliant with established discharge plan and continued treatment.  Alphonse Guild Deaysia Grigoryan. 05/23/2016

## 2016-05-23 NOTE — Progress Notes (Signed)
D: Patient still appears very bizarre. She states she's here because, "the police didn't agree with what I was saying." She denies SI/HI/AVH. She has been visible in the milieu but does not interact with others. She did attend group.  A: Medication given with education. Encouragement provided.  R: Patient was hesitant at first to take medication but after some encouragement she did so. She has remained calm and cooperative. Safety maintained with 15 min checks.

## 2016-05-23 NOTE — Progress Notes (Signed)
[facility] MD Progress Note  05/23/2016 11:40 AM Vicki Lee  MRN:  [id number] Subjective:  Vicki Lee is a 35 y/o F presenting to the ED  under petition for concerns about auditory hallucinations and abnormal behavior, as she was found in her hot car with her dog and refused to roll down the window for the police. She has a h/o admission to our unit for psychosis on 01/06/16-01/08/16. She was discharged with a diagnosis of delusional disorder and was prescribed with Risperdal 2 mg by mouth daily at bedtime.  Last week minimally communicative. Inappropriate affect when discussing death of her dog. Nurses reported thought blocking.  Withdrawn to room, in bed since admission, poor grooming.   Pt denies SI, HI or hallucinations. Denies depression or major issues with sleep, appetite, energy or concentration. Denies having side effects or physical complaints. Today she'll has been seen participating in groups. She is much more engaged during assessment and her affect was appropriate.  She has been compliant with Invega however she is unable to tell me the reason for taking it. We talk about injectable medication and she says she prefers to take the pills.   Per nurse's she has been is sleeping and eating well   Per nurses:  D: Pt denies SI. Refused Trazodone but was compliant with Invega. Affect flat. Pt did come to the nurses station after bed time and said that her husband was coming to pick her up. Pt appeared confused. Redirected back to her room. Appearance disheveled. Cooperative.  Principal Problem: Paranoid schizophrenia ([place]) Diagnosis:   Patient Active Problem List   Diagnosis Date Noted  . Paranoid schizophrenia ([place]) [F20.0] 05/18/2016   Total Time spent with patient: 30 minutes  Past Psychiatric History: Saw a psychologist during college for "sexual thoughts," but was not put on medications. Admitted to [facility] in April for psychotic symptoms, and was discharged on risperidone. She denies  any suicidal thought or intentions currently or in the past.    Past Medical History:      Past Medical History:  Diagnosis Date  . Crohn disease [facility])          Past Surgical History:  Procedure Laterality Date  . COLON SURGERY    . EYE SURGERY     Family History: History reviewed. No pertinent family history.   Family Psychiatric  History: Patient denies. No suicide attempts in the family. No substance abuse in the family.   Tobacco Screening: Have you used any form of tobacco in the last 30 days? (Cigarettes, Smokeless Tobacco, Cigars, and/or Pipes): No (Patient reported that she has never smoked.)   Social History:  Patient lives with her ex-husband in either [place] or [place]. She states that she has a fiance that she has known for a year. She does not have any kids. She is unemployed, but previously worked in [employer] until 2014, and at [employer] until 2015. She left work after being let go. She currently has no source of income.  She denies any criminal or legal problems.  History  Alcohol Use No    Comment: occ     History  Drug Use No    Social History   Social History  . Marital status: Married    Spouse name: N/A  . Number of children: N/A  . Years of education: N/A   Social History Main Topics  . Smoking status: Never Smoker  . Smokeless tobacco: Current User  . Alcohol use No  Comment: occ  . Drug use: No  . Sexual activity: Yes    Birth control/ protection: None   Other Topics Concern  . None   Social History Narrative  . None     Current Medications: Current Facility-Administered Medications  Medication Dose Route Frequency Provider Last Rate Last Dose  . acetaminophen (TYLENOL) tablet 650 mg  650 mg Oral Q6H PRN Gonzella Lex, MD      . alum & mag hydroxide-simeth (MAALOX/MYLANTA) 200-200-20 MG/5ML suspension 30 mL  30 mL Oral Q4H PRN Gonzella Lex, MD      . amantadine (SYMMETREL) capsule 100 mg  100 mg  Oral BID Hildred Priest, MD   100 mg at 05/23/16 9528  . magnesium hydroxide (MILK OF MAGNESIA) suspension 30 mL  30 mL Oral Daily PRN Gonzella Lex, MD      . paliperidone (INVEGA) 24 hr tablet 9 mg  9 mg Oral QHS Hildred Priest, MD      . traZODone (DESYREL) tablet 100 mg  100 mg Oral QHS Gonzella Lex, MD   100 mg at 05/19/16 2142    Lab Results:  No results found for this or any previous visit (from the past 48 hour(s)).  Blood Alcohol level:  Lab Results  Component Value Date   ETH <5 05/17/2016   ETH <5 41/32/4401    Metabolic Disorder Labs: Lab Results  Component Value Date   HGBA1C 5.5 05/19/2016   Lab Results  Component Value Date   PROLACTIN 171.3 (H) 05/19/2016   PROLACTIN 159.4 (H) 01/07/2016   Lab Results  Component Value Date   CHOL 186 05/19/2016   TRIG 96 05/19/2016   HDL 38 (L) 05/19/2016   CHOLHDL 4.9 05/19/2016   VLDL 19 05/19/2016   LDLCALC 129 (H) 05/19/2016   LDLCALC 121 (H) 01/07/2016    Physical Findings: AIMS:  , ,  ,  ,    CIWA:    COWS:     Musculoskeletal: Strength & Muscle Tone: within normal limits Gait & Station: normal Patient leans: N/A  Psychiatric Specialty Exam: Physical Exam  Nursing note and vitals reviewed. Constitutional: She is oriented to person, place, and time. She appears well-developed and well-nourished.  HENT:  Head: Normocephalic and atraumatic.  Eyes: EOM are normal.  Neck: Normal range of motion.  Respiratory: Effort normal.  Musculoskeletal: Normal range of motion.  Neurological: She is alert and oriented to person, place, and time.    Review of Systems  Constitutional: Negative.   HENT: Negative.   Eyes: Negative.   Respiratory: Negative.   Cardiovascular: Negative.   Gastrointestinal: Negative.   Genitourinary: Negative.   Musculoskeletal: Negative.   Skin: Negative.   Neurological: Negative.   Endo/Heme/Allergies: Negative.   Psychiatric/Behavioral: Negative.  Negative  for depression, hallucinations, memory loss, substance abuse and suicidal ideas. The patient is not nervous/anxious and does not have insomnia.     Blood pressure 97/82, pulse 92, temperature 98 F (36.7 C), temperature source Oral, resp. rate 18, height 5' 5"  (1.651 m), weight 89.8 kg (198 lb), last menstrual period 04/14/2016, SpO2 100 %.Body mass index is 32.95 kg/m.  General Appearance: Disheveled  Eye Contact:  Fair  Speech:  Clear and Coherent  Volume:  Normal  Mood:  Dysphoric  Affect:  Constricted less  Thought Process:  Linear and Descriptions of Associations: Loose  Orientation:  Full (Time, Place, and Person)  Thought Content:  Hallucinations: None  Suicidal Thoughts:  No  Homicidal Thoughts:  No  Memory:  Immediate;   Poor Recent;   Poor Remote;   Poor  Judgement:  Impaired  Insight:  Lacking  Psychomotor Activity:  Normal  Concentration:  Concentration: Poor and Attention Span: Poor  Recall:  Poor  Fund of Knowledge:  Poor  Language:  Fair  Akathisia:  No  Handed:    AIMS (if indicated):     Assets:  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 She has reported delusion and hallucinations.  She was diagnosed with delusional disorder a few months ago.  She did not take medications after discharge from our unit and did not follow up.   Patients dog, who was in car the with her prior to admission, has died.  She had inapropriate affect when we discussed it yesterday.  She had a grin in her face and said she had pictures of her dog.   Continue invega which will be increased to 9 mg po qhs  Insomia: Patient has been refusing trazodone. She has been is sleeping well without it and going to discontinue this medication..   Hyperprolactinemia: Continue amantadine  100 mg po bid.   Safety checks  q15 minutes.  Vital signs daily.  Regular Diet.  Disposition: patient will return to ex-husband's home after discharge. I will ask SW to contact family today  Hospitalization status: continue IVC  Patient is unreliable historian and we will try to contact family for collateral information.  Labs TSH, lipid panel and hemoglobin A1c are all wnl.  Prolactin is elevated.   HIV, RPR, B12 and  head CT all wnl.  Possible d/c in the next 48 h  Hildred Priest, MD 05/23/2016, 11:40 AM

## 2016-05-23 NOTE — Plan of Care (Signed)
Problem: Health Behavior/Discharge Planning: Goal: Compliance with prescribed medication regimen will improve Outcome: Progressing Pt has been compliant with evening Invega.

## 2016-05-23 NOTE — BHH Group Notes (Signed)
[facility] Group Notes:  (Nursing/MHT/Case Management/Adjunct)  Date:  05/23/2016  Time:  9:50 PM  Type of Therapy:  Group Therapy  Participation Level:  Active  Participation Quality:  Appropriate  Affect:  Appropriate  Cognitive:  Appropriate  Insight:  Appropriate  Engagement in Group:  Engaged  Modes of Intervention:  Education  Summary of Progress/Problems:  Vicki Lee 05/23/2016, 9:50 PM

## 2016-05-23 NOTE — BHH Group Notes (Signed)
[facility] LCSW Group Therapy   05/23/2016 1pm Type of Therapy: Group Therapy   Participation Level: Invited but did not attend.  Participation Quality: Invited but did not attend.   Glorious Peach, MSW, Latanya Presser

## 2016-05-23 NOTE — Progress Notes (Signed)
Recreation Therapy Notes  Date: 09.11.17 Time: 9:30 am Location: Craft Room  Group Topic: Self-expression  Goal Area(s) Addresses:  Patient will identify one color per emotion listed on wheel. Patient will verbalize benefit of using art as a means of self-expression. Patient will verbalize on emotion experienced during session. Patient will be educated on other forms of self-expression.  Behavioral Response: Attentive, Interactive  Intervention: Emotion Wheel  Activity: Patients were given an Licensed conveyancer and instructed to pick a color for each emotion listed on the wheel.  Education: LRT educated patients on other forms of self-expression.  Education Outcome: Acknowledges education/In group clarification offered  Clinical Observations/Feedback: Patient completed activity by picking colors for each emotion. Patient contributed to group discussion by stating what colors she picked for certain emotions.  Leonette Monarch, LRT/CTRS 05/23/2016 10:23 AM

## 2016-05-24 NOTE — Progress Notes (Signed)
Recreation Therapy Notes  Date: 9.12.17 Time: 9:30 am Location: Craft Room  Group Topic: Goal Setting  Goal Area(s) Addresses:  Patient will write at least one goal. Patient will write at least one obstacle.  Behavioral Response: Attentive  Intervention: Recovery Goal Chart  Activity: Patients were instructed to make a Recovery Goal Chart including goals, obstacles, the date they started working on their goals, and the date they achieved their goals.  Education: LRT educated patients on healthy ways they can celebrate reaching their goals.  Education Outcome: In group clarification offered  Clinical Observations/Feedback: Patient completed activity by writing goals and obstacles. Patient did not contribute to group discussion.  Leonette Monarch, LRT/CTRS 05/24/2016 10:24 AM

## 2016-05-24 NOTE — Progress Notes (Signed)
[facility] MD Progress Note  05/24/2016 11:42 AM Vicki Lee  MRN:  [id number] Subjective:  [name] is a 35 y/o F presenting to the ED  under petition for concerns about auditory hallucinations and abnormal behavior, as she was found in her hot car with her dog and refused to roll down the window for the police. She has a h/o admission to our unit for psychosis on 01/06/16-01/08/16. She was discharged with a diagnosis of delusional disorder and was prescribed with Risperdal 2 mg by mouth daily at bedtime.  Last week minimally communicative. Inappropriate affect when discussing death of her dog. Nurses reported thought blocking.  Withdrawn to room, in bed since admission, poor grooming.   Pt denies SI, HI or hallucinations. Denies depression or major issues with sleep, appetite, energy or concentration. Denies having side effects or physical complaints. She has attended some groups but does not have any interactions with others. She is much more engaged during assessment and her affect was appropriate.  She has been compliant with Invega however she is unable to tell me the reason for taking it. We talk about injectable medication and she says she prefers to take the pills. We discussed the need for injectable medication on Monday and again today but patient refuses.  Per nurse's she has been compliant with medication and has been eating well  Per nurses: Patient still appears very bizarre. She states she's here because, "the police didn't agree with what I was saying." She denies SI/HI/AVH. She has been visible in the milieu but does not interact with others. She did attend group.   Principal Problem: Paranoid schizophrenia ([place]) Diagnosis:   Patient Active Problem List   Diagnosis Date Noted  . Paranoid schizophrenia ([place]) [F20.0] 05/18/2016   Total Time spent with patient: 30 minutes  Past Psychiatric History: Saw a psychologist during college for "sexual thoughts," but was not put on  medications. Admitted to [facility] in April for psychotic symptoms, and was discharged on risperidone. She denies any suicidal thought or intentions currently or in the past.    Past Medical History:      Past Medical History:  Diagnosis Date  . Crohn disease [facility])          Past Surgical History:  Procedure Laterality Date  . COLON SURGERY    . EYE SURGERY     Family History: History reviewed. No pertinent family history.   Family Psychiatric  History: Patient denies. No suicide attempts in the family. No substance abuse in the family.   Tobacco Screening: Have you used any form of tobacco in the last 30 days? (Cigarettes, Smokeless Tobacco, Cigars, and/or Pipes): No (Patient reported that she has never smoked.)   Social History:  Patient lives with her ex-husband in either [place] or [place]. She states that she has a fiance that she has known for a year. She does not have any kids. She is unemployed, but previously worked in [employer] until 2014, and at [employer] until 2015. She left work after being let go. She currently has no source of income.  She denies any criminal or legal problems.  History  Alcohol Use No    Comment: occ     History  Drug Use No    Social History   Social History  . Marital status: Married    Spouse name: N/A  . Number of children: N/A  . Years of education: N/A   Social History Main Topics  . Smoking status:  Never Smoker  . Smokeless tobacco: Current User  . Alcohol use No     Comment: occ  . Drug use: No  . Sexual activity: Yes    Birth control/ protection: None   Other Topics Concern  . None   Social History Narrative  . None     Current Medications: Current Facility-Administered Medications  Medication Dose Route Frequency Provider Last Rate Last Dose  . acetaminophen (TYLENOL) tablet 650 mg  650 mg Oral Q6H PRN Gonzella Lex, MD      . alum & mag hydroxide-simeth (MAALOX/MYLANTA) 200-200-20 MG/5ML  suspension 30 mL  30 mL Oral Q4H PRN Gonzella Lex, MD      . amantadine (SYMMETREL) capsule 100 mg  100 mg Oral BID Hildred Priest, MD   100 mg at 05/24/16 5956  . magnesium hydroxide (MILK OF MAGNESIA) suspension 30 mL  30 mL Oral Daily PRN Gonzella Lex, MD      . paliperidone (INVEGA) 24 hr tablet 9 mg  9 mg Oral QHS Hildred Priest, MD   9 mg at 05/23/16 2128    Lab Results:  No results found for this or any previous visit (from the past 65 hour(s)).  Blood Alcohol level:  Lab Results  Component Value Date   ETH <5 05/17/2016   ETH <5 38/75/6433    Metabolic Disorder Labs: Lab Results  Component Value Date   HGBA1C 5.5 05/19/2016   Lab Results  Component Value Date   PROLACTIN 171.3 (H) 05/19/2016   PROLACTIN 159.4 (H) 01/07/2016   Lab Results  Component Value Date   CHOL 186 05/19/2016   TRIG 96 05/19/2016   HDL 38 (L) 05/19/2016   CHOLHDL 4.9 05/19/2016   VLDL 19 05/19/2016   LDLCALC 129 (H) 05/19/2016   LDLCALC 121 (H) 01/07/2016    Physical Findings: AIMS:  , ,  ,  ,    CIWA:    COWS:     Musculoskeletal: Strength & Muscle Tone: within normal limits Gait & Station: normal Patient leans: N/A  Psychiatric Specialty Exam: Physical Exam  Nursing note and vitals reviewed. Constitutional: She is oriented to person, place, and time. She appears well-developed and well-nourished.  HENT:  Head: Normocephalic and atraumatic.  Eyes: EOM are normal.  Neck: Normal range of motion.  Respiratory: Effort normal.  Musculoskeletal: Normal range of motion.  Neurological: She is alert and oriented to person, place, and time.    Review of Systems  Constitutional: Negative.   HENT: Negative.   Eyes: Negative.   Respiratory: Negative.   Cardiovascular: Negative.   Gastrointestinal: Negative.   Genitourinary: Negative.   Musculoskeletal: Negative.   Skin: Negative.   Neurological: Negative.   Endo/Heme/Allergies: Negative.    Psychiatric/Behavioral: Negative.  Negative for depression, hallucinations, memory loss, substance abuse and suicidal ideas. The patient is not nervous/anxious and does not have insomnia.     Blood pressure 123/76, pulse (!) 114, temperature 98.7 F (37.1 C), temperature source Oral, resp. rate 18, height 5' 5"  (1.651 m), weight 89.8 kg (198 lb), last menstrual period 04/14/2016, SpO2 100 %.Body mass index is 32.95 kg/m.  General Appearance: Disheveled  Eye Contact:  Fair  Speech:  Clear and Coherent  Volume:  Normal  Mood:  Dysphoric  Affect:  Constricted less  Thought Process:  Linear and Descriptions of Associations: Loose  Orientation:  Full (Time, Place, and Person)  Thought Content:  Hallucinations: None  Suicidal Thoughts:  No  Homicidal Thoughts:  No  Memory:  Immediate;   Poor Recent;   Poor Remote;   Poor  Judgement:  Impaired  Insight:  Lacking  Psychomotor Activity:  Normal  Concentration:  Concentration: Poor and Attention Span: Poor  Recall:  Poor  Fund of Knowledge:  Poor  Language:  Fair  Akathisia:  No  Handed:    AIMS (if indicated):     Assets:  Armed forces logistics/support/administrative officer Physical Health  ADL's:  Intact  Cognition:  WNL  Sleep:  Number of Hours: 5.5     Treatment Plan Summary:  Paranoid Schizophrenia: Patient had made several odd comments to various providers. She has reported delusion and hallucinations.  She was diagnosed with delusional disorder a few months ago.  She did not take medications after discharge from our unit and did not follow up. Patients dog, who was in car the with her prior to admission, has died.  She had inapropriate affect when we discussed it.  She had a grin in her face and said she had pictures of her dog.   Continue invega  9 mg po qhs  Insomnia: has been refusing trazodone.  Only slept 5 h last night, prior to that she was sleeping well.  Hyperprolactinemia: Continue amantadine  100 mg po bid.   Tachycardia: Heart rate was  elevated today. We'll see how her heart rate this tomorrow. Is very likely secondary to treatment with Invega.  Safety checks q15 minutes.  Vital signs daily.  Regular Diet.  Disposition: patient will return to ex-husband's home after discharge. I will ask SW to contact family today  Hospitalization status: continue IVC  Patient is unreliable historian and we will try to contact family for collateral information.  Labs TSH, lipid panel and hemoglobin A1c are all wnl.  Prolactin is elevated.   HIV, RPR, B12 and  head CT all wnl.  Possible d/c at the end of the week  Hildred Priest, MD 05/24/2016, 11:42 AM

## 2016-05-24 NOTE — BHH Group Notes (Signed)
Goals Group  Date/Time: 9:00 AM Type of Therapy and Topic: Group Therapy: Goals Group: SMART Goals  ?  Participation Level: Moderate  ?  Description of Group:  ?  The purpose of a daily goals group is to assist and guide patients in setting recovery/wellness-related goals. The objective is to set goals as they relate to the crisis in which they were admitted. Patients will be using SMART goal modalities to set measurable goals. Characteristics of realistic goals will be discussed and patients will be assisted in setting and processing how one will reach their goal. Facilitator will also assist patients in applying interventions and coping skills learned in psycho-education groups to the SMART goal and process how one will achieve defined goal.  ?  Therapeutic Goals:  ?  -Patients will develop and document one goal related to or their crisis in which brought them into treatment.  -Patients will be guided by LCSW using SMART goal setting modality in how to set a measurable, attainable, realistic and time sensitive goal.  -Patients will process barriers in reaching goal.  -Patients will process interventions in how to overcome and successful in reaching goal.  ?  Patient's Goal: Pt stated that her goal for today is to discharge and schedule appointments with her primary care team. In order to accomplish this goal, pt stated that she will comply with her treatment team and medications.   ?  Therapeutic Modalities:  Motivational Interviewing  Cognitive Behavioral Therapy  Crisis Intervention Model  SMART goals setting   Glorious Peach, MSW, LCSW-A 05/24/2016, 10:57AM

## 2016-05-24 NOTE — Plan of Care (Signed)
Problem: Safety: Goal: Ability to remain free from injury will improve Outcome: Progressing Patient has remained free from anger during this admission.

## 2016-05-24 NOTE — Progress Notes (Signed)
D: Patient still appears somewhat bizarre. Euphoric in her speech. States she's ready to begin this adventure ahead of her.  She denies SI/HI/AVH. She has been visible in the milieu but does not interact with others.  A: Medication given with education. Encouragement provided.  R: Patient was compliant with medication. She has remained calm and cooperative. Safety maintained with 15 min checks.

## 2016-05-24 NOTE — Plan of Care (Signed)
Problem: Safety: Goal: Ability to remain free from injury will improve No SI/HI.

## 2016-05-24 NOTE — Progress Notes (Signed)
Patient with depressed affect, guarded when writer initiates interaction. Minimal eye contact and quiet speech. No SI/HI and denies AVH at this time. Quiet with peers. Therapy groups encouraged to learn and initiate coping skills. Safety maintained.

## 2016-05-24 NOTE — BHH Group Notes (Signed)
[facility] LCSW Group Therapy   05/24/2016 1pm  Type of Therapy: Group Therapy   Participation Level: Active   Participation Quality: Attentive, Sharing and Supportive   Affect: Appropriate  Cognitive: Alert and Oriented   Insight: Developing/Improving and Engaged   Engagement in Therapy: Developing/Improving and Engaged   Modes of Intervention: Clarification, Confrontation, Discussion, Education, Exploration,  Limit-setting, Orientation, Problem-solving, Rapport Building, Art therapist, Socialization and Support  Summary of Progress/Problems: The topic for group therapy was feelings about diagnosis. Pt actively participated in group discussion on their past and current diagnosis and how they feel towards this. Pt also identified how society and family members judge them, based on their diagnosis as well as stereotypes and stigmas. Pt does not know diagnosis and informed CSW that she does not want to know what attending MD has diagnosed pt with at this time. Pt identifies feelings/emotions as determined. She states cognitive distortions as "I am not able to do this." Pt's new thoughts and action plan is to do the things she does not think she can do by having an accountability partner.    Glorious Peach, MSW, LCSWA 05/24/2016, 2:20PM

## 2016-05-25 MED ORDER — AMANTADINE HCL 100 MG PO CAPS: 100.0000 mg | ORAL_CAPSULE | Freq: Two times a day (BID) | ORAL | 0 refills | Status: DC

## 2016-05-25 MED ORDER — PALIPERIDONE ER 9 MG PO TB24: 9.0000 mg | ORAL_TABLET | Freq: Every day | ORAL | 0 refills | Status: DC

## 2016-05-25 NOTE — Progress Notes (Signed)
Patient with flat, blunted affect, cooperative behavior with meals. No SI/HI/AVH at this time. Patient withdrawn and guarded with poor adls. Minimal interaction with peers. MD into assess. Safety maintained.

## 2016-05-25 NOTE — BHH Group Notes (Signed)
[facility] Group Notes:  (Nursing/MHT/Case Management/Adjunct)  Date:  05/25/2016  Time:  7:26 AM  Type of Therapy:  Group Therapy  Participation Level:  Active  Participation Quality:  Appropriate  Affect:  Appropriate  Cognitive:  Appropriate  Insight:  Appropriate  Engagement in Group:  Engaged  Modes of Intervention:  Discussion  Summary of Progress/Problems:  [name] 05/25/2016, 7:26 AM

## 2016-05-25 NOTE — Progress Notes (Signed)
Recreation Therapy Notes  Date: 09.13.17 Time: 9:30 am Location: Craft Room  Group Topic: Self-esteem  Goal Area(s) Addresses:  Patient will identify positive attributes about self. Patient will identify at least one healthy coping skill.  Behavioral Response: Attentive, Interactive  Intervention: All About Me  Activity: Patients were instructed to make an All About Me pamphlet including their life's motto, positive attributes, healthy coping skills, and their support system.  Education: LRT educated patients on ways they can increase their self-esteem.  Education Outcome: Acknowledges education/In group clarification offered  Clinical Observations/Feedback: Patient completed activity by writing positive traits, healthy coping skills, and her support system. Patient contributed to group discussion by stating ways she can increase her self-esteem.  Leonette Monarch, LRT/CTRS 05/25/2016 10:30 AM

## 2016-05-25 NOTE — BHH Group Notes (Signed)
[facility] Group Notes:  (Nursing/MHT/Case Management/Adjunct)  Date:  05/25/2016  Time:  6:03 PM  Type of Therapy:  Psychoeducational Skills  Participation Level:  Active  Participation Quality:  Appropriate, Attentive and Sharing  Affect:  Appropriate  Cognitive:  Alert and Appropriate  Insight:  Appropriate  Engagement in Group:  Engaged  Modes of Intervention:  Discussion, Education and Support  Summary of Progress/Problems:  Adela Lank [facility] 05/25/2016, 6:03 PM

## 2016-05-25 NOTE — Progress Notes (Signed)
[facility] MD Progress Note  05/25/2016 10:05 AM Vicki Lee  MRN:  [id number] Subjective:  Vicki Lee is a 35 y/o F presenting to the ED  under petition for concerns about auditory hallucinations and abnormal behavior, as she was found in her hot car with her dog and refused to roll down the window for the police. She has a h/o admission to our unit for psychosis on 01/06/16-01/08/16. She was discharged with a diagnosis of delusional disorder and was prescribed with Risperdal 2 mg by mouth daily at bedtime.  Last week minimally communicative. Inappropriate affect when discussing death of her dog. Nurses reported thought blocking.  Withdrawn to room, in bed since admission, poor grooming.   Pt denies SI, HI or hallucinations. Denies depression or major issues with sleep, appetite, energy or concentration. Denies having side effects or physical complaints. She has attended some groups but does not have any interactions with others. She has been compliant with Invega however she is unable to tell me the reason for taking it. Despite many efforst she continues to refuse invega sustenna.  Spoke with pt's mother who will come tomorrow for a family meeting.  We were unable to contact her husband.  Per nurse's she has been compliant with medication and has been eating well  Per nurses: Patient with depressed affect, guarded when writer initiates interaction. Minimal eye contact and quiet speech. No SI/HI and denies AVH at this time. Quiet with peers. Therapy groups encouraged to learn and initiate coping skills. Safety maintained.   Principal Problem: Paranoid schizophrenia ([place]) Diagnosis:   Patient Active Problem List   Diagnosis Date Noted  . Paranoid schizophrenia ([place]) [F20.0] 05/18/2016   Total Time spent with patient: 30 minutes  Past Psychiatric History: Saw a psychologist during college for "sexual thoughts," but was not put on medications. Admitted to [facility] in April for psychotic symptoms,  and was discharged on risperidone. She denies any suicidal thought or intentions currently or in the past.    Past Medical History:      Past Medical History:  Diagnosis Date  . Crohn disease [facility])          Past Surgical History:  Procedure Laterality Date  . COLON SURGERY    . EYE SURGERY     Family History: History reviewed. No pertinent family history.   Family Psychiatric  History: Patient denies. No suicide attempts in the family. No substance abuse in the family.   Tobacco Screening: Have you used any form of tobacco in the last 30 days? (Cigarettes, Smokeless Tobacco, Cigars, and/or Pipes): No (Patient reported that she has never smoked.)   Social History:  Patient lives with her ex-husband in either [place] or [place]. She states that she has a fiance that she has known for a year. She does not have any kids. She is unemployed, but previously worked in [employer] until 2014, and at [employer] until 2015. She left work after being let go. She currently has no source of income.  She denies any criminal or legal problems.  History  Alcohol Use No    Comment: occ     History  Drug Use No    Social History   Social History  . Marital status: Married    Spouse name: N/A  . Number of children: N/A  . Years of education: N/A   Social History Main Topics  . Smoking status: Never Smoker  . Smokeless tobacco: Current User  . Alcohol use No  Comment: occ  . Drug use: No  . Sexual activity: Yes    Birth control/ protection: None   Other Topics Concern  . None   Social History Narrative  . None     Current Medications: Current Facility-Administered Medications  Medication Dose Route Frequency Provider Last Rate Last Dose  . acetaminophen (TYLENOL) tablet 650 mg  650 mg Oral Q6H PRN Gonzella Lex, MD      . alum & mag hydroxide-simeth (MAALOX/MYLANTA) 200-200-20 MG/5ML suspension 30 mL  30 mL Oral Q4H PRN Gonzella Lex, MD      .  amantadine (SYMMETREL) capsule 100 mg  100 mg Oral BID Hildred Priest, MD   100 mg at 05/25/16 0836  . magnesium hydroxide (MILK OF MAGNESIA) suspension 30 mL  30 mL Oral Daily PRN Gonzella Lex, MD      . paliperidone (INVEGA) 24 hr tablet 9 mg  9 mg Oral QHS Hildred Priest, MD   9 mg at 05/24/16 2109    Lab Results:  No results found for this or any previous visit (from the past 38 hour(s)).  Blood Alcohol level:  Lab Results  Component Value Date   ETH <5 05/17/2016   ETH <5 29/47/6546    Metabolic Disorder Labs: Lab Results  Component Value Date   HGBA1C 5.5 05/19/2016   Lab Results  Component Value Date   PROLACTIN 171.3 (H) 05/19/2016   PROLACTIN 159.4 (H) 01/07/2016   Lab Results  Component Value Date   CHOL 186 05/19/2016   TRIG 96 05/19/2016   HDL 38 (L) 05/19/2016   CHOLHDL 4.9 05/19/2016   VLDL 19 05/19/2016   LDLCALC 129 (H) 05/19/2016   LDLCALC 121 (H) 01/07/2016    Physical Findings: AIMS:  , ,  ,  ,    CIWA:    COWS:     Musculoskeletal: Strength & Muscle Tone: within normal limits Gait & Station: normal Patient leans: N/A  Psychiatric Specialty Exam: Physical Exam  Nursing note and vitals reviewed. Constitutional: She is oriented to person, place, and time. She appears well-developed and well-nourished.  HENT:  Head: Normocephalic and atraumatic.  Eyes: EOM are normal.  Neck: Normal range of motion.  Respiratory: Effort normal.  Musculoskeletal: Normal range of motion.  Neurological: She is alert and oriented to person, place, and time.    Review of Systems  Constitutional: Negative.   HENT: Negative.   Eyes: Negative.   Respiratory: Negative.   Cardiovascular: Negative.   Gastrointestinal: Negative.   Genitourinary: Negative.   Musculoskeletal: Negative.   Skin: Negative.   Neurological: Negative.   Endo/Heme/Allergies: Negative.   Psychiatric/Behavioral: Negative.  Negative for depression, hallucinations,  memory loss, substance abuse and suicidal ideas. The patient is not nervous/anxious and does not have insomnia.     Blood pressure 114/71, pulse 90, temperature 98.2 F (36.8 C), resp. rate 18, height 5' 5"  (1.651 m), weight 89.8 kg (198 lb), last menstrual period 04/14/2016, SpO2 100 %.Body mass index is 32.95 kg/m.  General Appearance: Disheveled  Eye Contact:  Fair  Speech:  Clear and Coherent  Volume:  Normal  Mood:  Dysphoric  Affect:  Constricted less  Thought Process:  Linear and Descriptions of Associations: Loose  Orientation:  Full (Time, Place, and Person)  Thought Content:  Hallucinations: None  Suicidal Thoughts:  No  Homicidal Thoughts:  No  Memory:  Immediate;   Poor Recent;   Poor Remote;   Poor  Judgement:  Impaired  Insight:  Lacking  Psychomotor Activity:  Normal  Concentration:  Concentration: Poor and Attention Span: Poor  Recall:  Poor  Fund of Knowledge:  Poor  Language:  Fair  Akathisia:  No  Handed:    AIMS (if indicated):     Assets:  Armed forces logistics/support/administrative officer Physical Health  ADL's:  Intact  Cognition:  WNL  Sleep:  Number of Hours: 6     Treatment Plan Summary:  Paranoid Schizophrenia: Patient had made several odd comments to various providers. She has reported delusion and hallucinations.  She was diagnosed with delusional disorder a few months ago.  She did not take medications after discharge from our unit and did not follow up. Patients dog, who was in car the with her prior to admission, has died.  She had inapropriate affect when we discussed it.  She had a grin in her face and said she had pictures of her dog.   Continue invega  9 mg po qhs. Denies any side effects.  Insomnia: slept 6 h last night  Hyperprolactinemia: Continue amantadine  100 mg po bid.   Tachycardia: Heart rate was elevated yesterday, Today it is wnl.  Safety checks q15 minutes.  Vital signs daily.  Regular Diet.  Disposition: patient will return to  ex-husband's home after discharge.  Per pt's mother she is ok to return to live with him  Hospitalization status: continue IVC  Patient is unreliable historian and we will try to contact family for collateral information.  Labs TSH, lipid panel and hemoglobin A1c are all wnl.  Prolactin is elevated.   HIV, RPR, B12 and  head CT all wnl.  Possible d/c tomorrow after family meeting  Hildred Priest, MD 05/25/2016, 10:05 AM

## 2016-05-25 NOTE — BHH Suicide Risk Assessment (Signed)
[facility] Discharge Suicide Risk Assessment   Principal Problem: Paranoid schizophrenia [facility]) Discharge Diagnoses:  Patient Active Problem List   Diagnosis Date Noted  . Paranoid schizophrenia ([place]) [F20.0] 05/18/2016     Psychiatric Specialty Exam: ROS  Blood pressure 116/69, pulse 93, temperature 98.3 F (36.8 C), temperature source Oral, resp. rate 18, height 5' 5"  (1.651 m), weight 89.8 kg (198 lb), last menstrual period 04/14/2016, SpO2 100 %.Body mass index is 32.95 kg/m.                                                       Mental Status Per Nursing Assessment::   On Admission:     Demographic Factors:  Unemployed  Loss Factors: NA  Historical Factors: NA  Risk Reduction Factors:   Living with another person, especially a relative and Positive social support  Continued Clinical Symptoms:  Schizophrenia:   Less than 65 years old Previous Psychiatric Diagnoses and Treatments  Cognitive Features That Contribute To Risk:  Loss of executive function    Suicide Risk:  Minimal: No identifiable suicidal ideation.  Patients presenting with no risk factors but with morbid ruminations; may be classified as minimal risk based on the severity of the depressive symptoms  Follow-up Information    [facility] .   Specialty:  Behavioral Health Why:   Please arrive to the walk-in clinic Monday through Friday from 9-4pm for your assessment for your hospital follow up for medication management and therapy. Contact information: [place] [address] [phone]        [facility] .   Contact information: [address]  [place]          [place], [place] [address]  Phone: [phone] Fax: [phone]           Hildred Priest, MD 05/26/2016, 5:00 PM

## 2016-05-25 NOTE — BHH Group Notes (Signed)
[facility] Group Notes:  (Nursing/MHT/Case Management/Adjunct)  Date:  05/25/2016  Time:  9:26 PM  Type of Therapy:  Evening Wrap-up Group  Participation Level:  Minimal  Participation Quality:  Appropriate and Attentive  Affect:  Appropriate  Cognitive:  Alert and Appropriate  Insight:  Appropriate  Engagement in Group:  Observant  Modes of Intervention:  Activity  Summary of Progress/Problems:  [name] 05/25/2016, 9:26 PM

## 2016-05-25 NOTE — BHH Group Notes (Signed)
[facility] LCSW Group Therapy   05/25/2016  1:00 pm   Type of Therapy: Group Therapy   Participation Level: Active   Participation Quality: Attentive, Sharing and Supportive   Affect: Appropriate   Cognitive: Alert and Oriented   Insight: Developing/Improving and Engaged   Engagement in Therapy: Developing/Improving and Engaged   Modes of Intervention: Clarification, Confrontation, Discussion, Education, Exploration, Limit-setting, Orientation, Problem-solving, Rapport Building, Art therapist, Socialization and Support   Summary of Progress/Problems: The topic for group today was emotional regulation. This group focused on both positive and negative emotion identification and allowed  group members to process ways to identify feelings, regulate negative emotions, and find healthy ways to manage internal/external emotions. Group members were asked to reflect on a time when their reaction to an emotion led to a negative outcome and explored how alternative responses using emotion regulation would have benefited them. Group members were also asked to discuss a time when emotion regulation was utilized when a negative emotion was experienced. Patient identified triggers for negative emotions as loud noises and chaos. Pt unable to identify strategies to reduce exposure to triggers at this time.    Glorious Peach, MSW, LCSWA 05/25/2016, 2:31PM
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
## 2016-05-26 NOTE — BHH Suicide Risk Assessment (Signed)
[place] INPATIENT:  Family/Significant Other Suicide Prevention Education  Suicide Prevention Education:  Patient Refusal for Family/Significant Other Suicide Prevention Education: The patient Vicki Lee has refused to provide written consent for family/significant other to be provided Family/Significant Other Suicide Prevention Education during admission and/or prior to discharge.  Physician notified.  Emilie Rutter, MSW, LCSW-A 05/26/2016, 8:46 AM

## 2016-05-26 NOTE — Progress Notes (Signed)
Patient discharged at this time. Verbalizes understanding rt recommended discharge plan of care. Acknowledges all belongings have been returned. Safety maintained. Discharge to Mother and a few family members.

## 2016-05-26 NOTE — Progress Notes (Signed)
  [facility] Adult Case Management Discharge Plan :  Will you be returning to the same living situation after discharge:  Yes,  home with family. At discharge, do you have transportation home?: Yes,  mother will pick pt up. Do you have the ability to pay for your medications: Yes,  BCBS.  Release of information consent forms completed and in the chart;  Patient's signature needed at discharge.  Patient to Follow up at: Follow-up Information    [facility] .   Specialty:  Behavioral Health Why:   Please arrive to the walk-in clinic Monday through Friday from 9-4pm for your assessment for your hospital follow up for medication management and therapy. Contact information: [place] [address] [phone]        [place] .   Contact information: [address]          [place], [place] [address]  Phone: [phone] Fax: [phone]          Next level of care provider has access to [place] and Suicide Prevention discussed: Yes,  SPE reviewed with pt.  Have you used any form of tobacco in the last 30 days? (Cigarettes, Smokeless Tobacco, Cigars, and/or Pipes): No (Patient reported that she has never smoked.)  Has patient been referred to the Quitline?: N/A patient is not a smoker  Patient has been referred for addiction treatment: [place], MSW, LCSW-A 05/26/2016, 12:07 PM

## 2016-05-26 NOTE — Progress Notes (Signed)
Patient with appropriate affect, cooperative behavior with meals, meds and plan of care. No SI/HI at this time. Patient to discharge today when discharge plan and transportation in place. Safety maintained.

## 2016-05-26 NOTE — BHH Group Notes (Signed)
[place] LCSW Group Therapy   05/26/2016 9:30 am   Type of Therapy: Group Therapy   Participation Level: Active   Participation Quality: Attentive, Sharing and Supportive   Affect: Appropriate   Cognitive: Alert and Oriented   Insight: Developing/Improving and Engaged   Engagement in Therapy: Developing/Improving and Engaged   Modes of Intervention: Clarification, Confrontation, Discussion, Education, Exploration, Limit-setting, Orientation, Problem-solving, Rapport Building, Art therapist, Socialization and Support   Summary of Progress/Problems: The topic for group was balance in life. Today's group focused on defining balance in one's own words, identifying things that can knock one off balance, and exploring healthy ways to maintain balance in life. Group members were asked to provide an example of a time when they felt off balance, describe how they handled that situation, and process healthier ways to regain balance in the future. Group members were asked to share the most important tool for maintaining balance that they learned while at [facility] and how they plan to apply this method after discharge.  Pt defined balance as "having everything in her life equal." She stated that his life becomes unbalanced when her symptoms emerge. Pt identified outpatient treatment and family support as her action plan to create or restore balance in his life.   Glorious Peach, MSW, LCSW-A 05/26/2016, 11:25AM

## 2016-05-26 NOTE — Progress Notes (Signed)
D: Pt denies SI/HI/AVH. Pt is pleasant and cooperative. Pt appears less anxious and he is interacting with peers and staff appropriately.  A: Pt was offered support and encouragement. Pt was given scheduled medications. Pt was encouraged to attend groups. Q 15 minute checks were done for safety.  R:Pt attends groups and interacts well with peers and staff. Pt is taking medication. Pt has no complaints.Pt receptive to treatment and safety maintained on unit.

## 2017-02-20 ENCOUNTER — Ambulatory Visit (INDEPENDENT_AMBULATORY_CARE_PROVIDER_SITE_OTHER): Payer: BLUE CROSS/BLUE SHIELD | Admitting: Nurse Practitioner

## 2017-02-20 ENCOUNTER — Telehealth: Payer: Self-pay | Admitting: Nurse Practitioner

## 2017-02-20 ENCOUNTER — Other Ambulatory Visit (HOSPITAL_COMMUNITY)
Admission: RE | Admit: 2017-02-20 | Discharge: 2017-02-20 | Disposition: A | Discharge status: Home / Self Care | Payer: BLUE CROSS/BLUE SHIELD | Source: Ambulatory Visit | Attending: Nurse Practitioner | Admitting: Nurse Practitioner

## 2017-02-20 ENCOUNTER — Encounter: Payer: Self-pay | Admitting: Nurse Practitioner

## 2017-02-20 VITALS — BP 106/64 | HR 60 | Ht 65.0 in | Wt 208.0 lb

## 2017-02-20 DIAGNOSIS — N852 Hypertrophy of uterus: Secondary | ICD-10-CM

## 2017-02-20 DIAGNOSIS — Z0000 Encounter for general adult medical examination without abnormal findings: Secondary | ICD-10-CM | POA: Insufficient documentation

## 2017-02-20 DIAGNOSIS — D259 Leiomyoma of uterus, unspecified: Secondary | ICD-10-CM

## 2017-02-20 DIAGNOSIS — Z01411 Encounter for gynecological examination (general) (routine) with abnormal findings: Secondary | ICD-10-CM

## 2017-02-20 LAB — POCT URINE PREGNANCY: Preg Test, Ur: NEGATIVE

## 2017-02-20 NOTE — Telephone Encounter (Signed)
Placed call to patient to discuss benefits for a recommended ultrasound. Left voicemail requesting a return call.
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
## 2017-02-20 NOTE — Patient Instructions (Signed)

## 2017-02-20 NOTE — Telephone Encounter (Signed)
Call to patient's mother, Laverle Patter, at [phone] per recommendation from [place], [place] based on patient's personal history. Noted that patient gave verbal consent to speak with her mother during her visit today. Discussed benefit for recommended Pelvic Ultrasound with Ms. Harris. She understood and is agreeable. Offered appointment tomorrow, 02-21-17. She is unavailable and agreeable to schedule in the afternoon on Thursday, 02-23-17.   Patient scheduled for a 3:30pm Ultrasound with a 4pm consult on 02-23-17 with Dr. Sabra Heck. Ms. Kenton Kingfisher is agreeable to appointment information and arrival time.   She is aware patient needs a written DPR signed during this appointment.   Routing to provider for review. Will close encounter.

## 2017-02-22 LAB — CYTOLOGY - PAP
Diagnosis: NEGATIVE
HPV: NOT DETECTED

## 2017-02-22 NOTE — Progress Notes (Signed)
Encounter reviewed by Dr. Allayah Raineri Amundson C. Silva.  

## 2017-02-23 ENCOUNTER — Ambulatory Visit (INDEPENDENT_AMBULATORY_CARE_PROVIDER_SITE_OTHER): Payer: BLUE CROSS/BLUE SHIELD | Admitting: Obstetrics & Gynecology

## 2017-02-23 ENCOUNTER — Other Ambulatory Visit: Payer: Self-pay | Admitting: Obstetrics & Gynecology

## 2017-02-23 ENCOUNTER — Ambulatory Visit (INDEPENDENT_AMBULATORY_CARE_PROVIDER_SITE_OTHER): Payer: BLUE CROSS/BLUE SHIELD

## 2017-02-23 ENCOUNTER — Other Ambulatory Visit: Payer: Self-pay

## 2017-02-23 VITALS — BP 116/80 | HR 88 | Resp 16 | Ht 65.0 in | Wt 208.0 lb

## 2017-02-23 DIAGNOSIS — N852 Hypertrophy of uterus: Secondary | ICD-10-CM | POA: Diagnosis not present

## 2017-02-23 DIAGNOSIS — D259 Leiomyoma of uterus, unspecified: Secondary | ICD-10-CM | POA: Diagnosis not present
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
## 2017-02-24 ENCOUNTER — Encounter: Payer: Self-pay | Admitting: Obstetrics & Gynecology

## 2017-02-24 DIAGNOSIS — D252 Subserosal leiomyoma of uterus: Secondary | ICD-10-CM

## 2017-02-24 DIAGNOSIS — D251 Intramural leiomyoma of uterus: Secondary | ICD-10-CM

## 2017-02-24 DIAGNOSIS — D250 Submucous leiomyoma of uterus: Secondary | ICD-10-CM | POA: Insufficient documentation

## 2017-02-24 DIAGNOSIS — F324 Major depressive disorder, single episode, in partial remission: Secondary | ICD-10-CM | POA: Insufficient documentation

## 2017-02-26 ENCOUNTER — Encounter: Payer: Self-pay | Admitting: Obstetrics & Gynecology

## 2017-03-21 ENCOUNTER — Telehealth: Payer: Self-pay | Admitting: Obstetrics & Gynecology

## 2017-03-21 NOTE — Telephone Encounter (Signed)
Patient's mother Earlie Server is calling for an update on her daughters referral. DPR on file to talk with mom. Earlie Server is aware that Jacqlyn Larsen is out the office today.

## 2017-03-22 NOTE — Telephone Encounter (Signed)
Call to patient's mother, Vicki Lee, to notify patient scheduled for referral.   Left voicemail to return call for information. The information is listed below. Should the patient need to cancel or reschedule this appointment, please advise them to call the office they've been referred to in order to reschedule.   [facility] [address]  [place] [address]  [place], [place]  [address] Phone: [phone]  Dr Rolin Barry 05-01-17 @ 11am. Please arrive 15 minutes early and bring your insurance card, photo id, specialist copay and list of medications.

## 2017-03-22 NOTE — Telephone Encounter (Signed)
Spoke with Ms. Harris, patient's mother, and provided all referral information. She is aware and agreeable to information.   Routing to provider for review. Will close encounter.

## 2017-05-31 ENCOUNTER — Emergency Department (HOSPITAL_BASED_OUTPATIENT_CLINIC_OR_DEPARTMENT_OTHER)
Admission: EM | Admit: 2017-05-31 | Discharge: 2017-05-31 | Disposition: A | Discharge status: Home / Self Care | Payer: BLUE CROSS/BLUE SHIELD | Attending: Emergency Medicine | Admitting: Emergency Medicine

## 2017-05-31 ENCOUNTER — Encounter (HOSPITAL_BASED_OUTPATIENT_CLINIC_OR_DEPARTMENT_OTHER): Payer: Self-pay | Admitting: Emergency Medicine

## 2017-05-31 DIAGNOSIS — R1084 Generalized abdominal pain: Secondary | ICD-10-CM | POA: Insufficient documentation

## 2017-05-31 DIAGNOSIS — Z79899 Other long term (current) drug therapy: Secondary | ICD-10-CM | POA: Diagnosis not present

## 2017-05-31 LAB — CBC
HCT: 31.6 % — ABNORMAL LOW (ref 36.0–46.0)
Hemoglobin: 9.8 g/dL — ABNORMAL LOW (ref 12.0–15.0)
MCH: 21.7 pg — AB (ref 26.0–34.0)
MCHC: 31 g/dL (ref 30.0–36.0)
MCV: 69.9 fL — ABNORMAL LOW (ref 78.0–100.0)
PLATELETS: 521 10*3/uL — AB (ref 150–400)
RBC: 4.52 MIL/uL (ref 3.87–5.11)
RDW: 17.2 % — ABNORMAL HIGH (ref 11.5–15.5)
WBC: 7.4 10*3/uL (ref 4.0–10.5)

## 2017-05-31 LAB — COMPREHENSIVE METABOLIC PANEL
ALK PHOS: 105 U/L (ref 38–126)
ALT: 10 U/L — AB (ref 14–54)
AST: 15 U/L (ref 15–41)
Albumin: 3.3 g/dL — ABNORMAL LOW (ref 3.5–5.0)
Anion gap: 6 (ref 5–15)
BILIRUBIN TOTAL: 0.3 mg/dL (ref 0.3–1.2)
BUN: 9 mg/dL (ref 6–20)
CALCIUM: 8.8 mg/dL — AB (ref 8.9–10.3)
CO2: 23 mmol/L (ref 22–32)
CREATININE: 0.83 mg/dL (ref 0.44–1.00)
Chloride: 106 mmol/L (ref 101–111)
GFR calc Af Amer: >60 mL/min (ref >60)
Glucose, Bld: 97 mg/dL (ref 65–99)
Potassium: 3.7 mmol/L (ref 3.5–5.1)
Sodium: 135 mmol/L (ref 135–145)
Total Protein: 7.6 g/dL (ref 6.5–8.1)

## 2017-05-31 LAB — URINALYSIS, ROUTINE W REFLEX MICROSCOPIC
Bilirubin Urine: NEGATIVE
GLUCOSE, UA: NEGATIVE mg/dL
HGB URINE DIPSTICK: NEGATIVE
KETONES UR: NEGATIVE mg/dL
Leukocytes, UA: NEGATIVE
Nitrite: NEGATIVE
PH: 7.5 (ref 5.0–8.0)
Protein, ur: NEGATIVE mg/dL
Specific Gravity, Urine: 1.015 (ref 1.005–1.030)

## 2017-05-31 LAB — PREGNANCY, URINE: Preg Test, Ur: NEGATIVE

## 2017-05-31 LAB — LIPASE, BLOOD: Lipase: 30 U/L (ref 11–51)

## 2017-05-31 MED ORDER — IBUPROFEN 800 MG PO TABS
800.0000 mg | ORAL_TABLET | Freq: Once | ORAL | Status: AC
  Administered 2017-05-31: 800 mg via ORAL
  Filled 2017-05-31: qty 1

## 2017-05-31 MED ORDER — IBUPROFEN 600 MG PO TABS: 600.0000 mg | ORAL_TABLET | Freq: Four times a day (QID) | ORAL | 0 refills | Status: DC | PRN

## 2017-05-31 NOTE — Discharge Instructions (Signed)
As discussed, please make sure that you stay well-hydrated. Follow-up with your primary care appointment on Monday. Take ibuprofen for pain. Warm pad may be helpful.  Return to the emergency department if you experience any weakness, lightheadedness, dizziness, rectal bleeding, or other new concerning symptoms in the meantime.

## 2017-05-31 NOTE — ED Provider Notes (Signed)
[place] [facility] Provider Note   CSN: [id number] Arrival date & time: 05/31/17  1845     History   Chief Complaint Chief Complaint  Patient presents with  . Abdominal Pain    HPI Vicki Lee is a 36 y.o. female G0 P0 denying any past medical history presenting with 2 hours of Progressive onset generalized abdominal discomfort with no associated symptoms. Denies nausea, vomiting, diarrhea, dysuria, hematuria, vaginal discharge, fever, chills or any other symptoms. She is requesting something to drink. Patient reports a normal bowel movement earlier today.  LMP 05/22/17 HPI  Past Medical History:  Diagnosis Date  . Anxiety   . Crohn disease ([place])   . Depression   . Fibroid   . Infertility, female     Patient Active Problem List   Diagnosis Date Noted  . Fibroid uterus 02/24/2017  . Depression   . Paranoid schizophrenia ([place]) 05/18/2016    Past Surgical History:  Procedure Laterality Date  . COLON SURGERY  2000    due to Chron's age 89.  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    OB History    Gravida Para Term Preterm AB Living   0 0 0 0 0 0   SAB TAB Ectopic Multiple Live Births   0 0 0 0 0       Home Medications    Prior to Admission medications   Medication Sig Start Date End Date Taking? Authorizing Provider  ibuprofen (ADVIL,MOTRIN) 600 MG tablet Take 1 tablet (600 mg total) by mouth every 6 (six) hours as needed. 05/31/17   Emeline General, PA-C  traZODone (DESYREL) 50 MG tablet TK 1 T PO QHS 02/20/17   [provider]    Family History Family History  Problem Relation Age of Onset  . Hyperlipidemia Mother   . Hypertension Mother   . Crohn's disease Father   . Crohn's disease Brother   . Bone cancer Maternal Grandfather     Social History Social History  Substance Use Topics  . Smoking status: Never Smoker  . Smokeless tobacco: Never Used  . Alcohol use No     Comment: occ     Allergies   Patient has no  known allergies.   Review of Systems Review of Systems  Constitutional: Negative for appetite change, chills, fatigue, fever and unexpected weight change.  HENT: Negative for congestion, ear pain, sore throat and trouble swallowing.   Eyes: Negative for pain and visual disturbance.  Respiratory: Negative for cough, chest tightness, shortness of breath, wheezing and stridor.   Cardiovascular: Negative for chest pain and palpitations.  Gastrointestinal: Positive for abdominal pain. Negative for abdominal distention, blood in stool, constipation, diarrhea, nausea and vomiting.       She reports generalized abdominal discomfort no focal pain.  Genitourinary: Negative for difficulty urinating, dysuria, flank pain, frequency, hematuria, menstrual problem, pelvic pain, urgency, vaginal discharge and vaginal pain.  Musculoskeletal: Negative for arthralgias, back pain and myalgias.  Skin: Negative for color change, pallor, rash and wound.  Neurological: Negative for dizziness, seizures, syncope, light-headedness and headaches.  Psychiatric/Behavioral: Negative for sleep disturbance.     Physical Exam Updated Vital Signs BP 110/64 (BP Location: Left Arm)   Pulse 77   Temp 98.2 F (36.8 C) (Oral)   Resp 16   Ht 5' 2"  (1.575 m)   Wt 86.2 kg (190 lb)   LMP 05/24/2017 (Approximate)   SpO2 100%   BMI 34.75 kg/m  Physical Exam  Constitutional: She appears well-developed and well-nourished. No distress.  Afebrile, nontoxic-appearing, lying comfortably in bed in no acute distress.  HENT:  Head: Normocephalic and atraumatic.  Eyes: Conjunctivae are normal.  Neck: Neck supple.  Cardiovascular: Normal rate, regular rhythm and normal heart sounds.   No murmur heard. Pulmonary/Chest: Effort normal and breath sounds normal. No respiratory distress. She has no wheezes. She has no rales.  Abdominal: Soft. She exhibits mass. There is no tenderness. There is no rebound and no guarding.  Large  masses can't be appreciated on palpation. Diffuse discomfort no tenderness palpation  Musculoskeletal: Normal range of motion. She exhibits no edema.  Neurological: She is alert.  Skin: Skin is warm and dry. No rash noted. She is not diaphoretic. No erythema. No pallor.  Psychiatric: She has a normal mood and affect.  Nursing note and vitals reviewed.    ED Treatments / Results  Labs (all labs ordered are listed, but only abnormal results are displayed) Labs Reviewed  COMPREHENSIVE METABOLIC PANEL - Abnormal; Notable for the following:       Result Value   Calcium 8.8 (*)    Albumin 3.3 (*)    ALT 10 (*)    All other components within normal limits  CBC - Abnormal; Notable for the following:    Hemoglobin 9.8 (*)    HCT 31.6 (*)    MCV 69.9 (*)    MCH 21.7 (*)    RDW 17.2 (*)    Platelets 521 (*)    All other components within normal limits  URINALYSIS, ROUTINE W REFLEX MICROSCOPIC  PREGNANCY, URINE  LIPASE, BLOOD    EKG  EKG Interpretation None       Radiology No results found.  Procedures Procedures (including critical care time)  Medications Ordered in ED Medications  ibuprofen (ADVIL,MOTRIN) tablet 800 mg (800 mg Oral Given 05/31/17 2205)     Initial Impression / Assessment and Plan / ED Course  I have reviewed the triage vital signs and the nursing notes.  Pertinent labs & imaging results that were available during my care of the patient were reviewed by me and considered in my medical decision making (see chart for details).     Hemoglobin is lower when compared to prior labs. She just finished a heavy cycle. She denies any rectal bleeding. Normal bowel movement this morning.  Reviewed prior CT with large fibroids. History of Crohn's in 1998 which has been well managed without flares or medications since.  Patient has a follow-up appointment with her PCP already on Monday for fibroids.  Successful PO challenge. Patient is otherwise well-appearing,  nontoxic and afebrile. Will discharge home with symptomatic relief and close follow-up.  Discussed strict return precautions and advised to return to the emergency department if experiencing any new or worsening symptoms. Instructions were understood and patient agreed with discharge plan. Final Clinical Impressions(s) / ED Diagnoses   Final diagnoses:  Generalized abdominal pain    New Prescriptions Discharge Medication List as of 05/31/2017 10:02 PM    START taking these medications   Details  ibuprofen (ADVIL,MOTRIN) 600 MG tablet Take 1 tablet (600 mg total) by mouth every 6 (six) hours as needed., Starting Wed 05/31/2017, Print         Emeline General, PA-C 06/01/17 Antony Madura    Tanna Furry, MD 06/13/17 2032

## 2017-05-31 NOTE — ED Notes (Signed)
Pt drank whole cup of water without difficulty.

## 2017-05-31 NOTE — ED Triage Notes (Signed)
Reports generalized abdominal pain which began 1 hour PTA.  Denies N/V/D, vaginal complaints, dysuria, hematuria.

## 2017-05-31 NOTE — ED Notes (Signed)
Denies any Vaginal Discharge

## 2017-05-31 NOTE — ED Notes (Signed)
ED Provider at bedside. 

## 2017-08-09 DIAGNOSIS — K5090 Crohn's disease, unspecified, without complications: Secondary | ICD-10-CM | POA: Insufficient documentation

## 2017-08-14 DIAGNOSIS — D500 Iron deficiency anemia secondary to blood loss (chronic): Secondary | ICD-10-CM | POA: Insufficient documentation

## 2017-09-12 HISTORY — PX: MYOMECTOMY: SHX85

## 2018-02-20 ENCOUNTER — Ambulatory Visit: Payer: BLUE CROSS/BLUE SHIELD | Admitting: Nurse Practitioner

## 2018-02-20 ENCOUNTER — Encounter: Payer: Self-pay | Admitting: Certified Nurse Midwife

## 2018-02-20 ENCOUNTER — Ambulatory Visit: Payer: BLUE CROSS/BLUE SHIELD | Admitting: Certified Nurse Midwife

## 2018-02-20 ENCOUNTER — Ambulatory Visit: Payer: Self-pay | Admitting: Certified Nurse Midwife

## 2018-02-20 NOTE — Progress Notes (Deleted)
37 y.o. G0P0000 Married  {Race/ethnicity:17218} Fe here for annual exam.    No LMP recorded.          Sexually active: {yes no:314532}  The current method of family planning is {contraception:315051}.    Exercising: {yes no:314532}  {types:19826} Smoker:  {YES NO:22349}  Health Maintenance: Pap:  02-20-17 neg HPV HR neg History of Abnormal Pap: {YES NO:22349} MMG:  none Self Breast exams: {YES NO:22349} Colonoscopy:  80yr ago due to Crohn's BMD:   none TDaP:  *** Shingles: no Pneumonia: no Hep C and HIV: HIV neg 2017 Labs: ***   reports that she has never smoked. She has never used smokeless tobacco. She reports that she does not drink alcohol or use drugs.  Past Medical History:  Diagnosis Date  . Anxiety   . [place]   . Depression   . Fibroid   . Infertility, female     Past Surgical History:  Procedure Laterality Date  . 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    Current Outpatient Medications  Medication Sig Dispense Refill  . ibuprofen (ADVIL,MOTRIN) 600 MG tablet Take 1 tablet (600 mg total) by mouth every 6 (six) hours as needed. 30 tablet 0  . traZODone (DESYREL) 50 MG tablet TK 1 T PO QHS  1   No current facility-administered medications for this visit.     Family History  Problem Relation Age of Onset  . Hyperlipidemia Mother   . Hypertension Mother   . Crohn's disease Father   . Crohn's disease Brother   . Bone cancer Maternal Grandfather     ROS:  Pertinent items are noted in HPI.  Otherwise, a comprehensive ROS was negative.  Exam:   There were no vitals taken for this visit.   Ht Readings from Last 3 Encounters:  05/31/17 5' 2"  (1.575 m)  02/23/17 5' 5"  (1.651 m)  02/20/17 5' 5"  (1.651 m)    General appearance: alert, cooperative and appears stated age Head: Normocephalic, without obvious abnormality, atraumatic Neck: no adenopathy, supple, symmetrical, trachea midline and  thyroid {EXAM; THYROID:18604} Lungs: clear to auscultation bilaterally Breasts: {Exam; breast:13139::"normal appearance, no masses or tenderness"} Heart: regular rate and rhythm Abdomen: soft, non-tender; no masses,  no organomegaly Extremities: extremities normal, atraumatic, no cyanosis or edema Skin: Skin color, texture, turgor normal. No rashes or lesions Lymph nodes: Cervical, supraclavicular, and axillary nodes normal. No abnormal inguinal nodes palpated Neurologic: Grossly normal   Pelvic: External genitalia:  no lesions              Urethra:  normal appearing urethra with no masses, tenderness or lesions              Bartholin's and Skene's: normal                 Vagina: normal appearing vagina with normal color and discharge, no lesions              Cervix: {exam; cervix:14595}              Pap taken: {yes no:314532} Bimanual Exam:  Uterus:  {exam; uterus:12215}              Adnexa: {exam; adnexa:12223}               Rectovaginal: Confirms  Anus:  normal sphincter tone, no lesions  Chaperone present: ***  A:  Well Woman with normal exam  P:   Reviewed health and wellness pertinent to exam  Pap smear: {YES NO:22349}  {plan; gyn:5269::"mammogram","pap smear","return annually or prn"}  An After Visit Summary was printed and given to the patient.

## 2018-02-20 NOTE — Progress Notes (Deleted)
37 y.o. G0P0000 Married  {Race/ethnicity:17218} Fe here for annual exam.    No LMP recorded.          Sexually active: {yes no:314532}  The current method of family planning is {contraception:315051}.    Exercising: {yes no:314532}  {types:19826} Smoker:  {YES NO:22349}  Health Maintenance: Pap:  02-20-17 neg HPV HR neg History of Abnormal Pap: {YES NO:22349} MMG:  none Self Breast exams: {YES NO:22349} Colonoscopy:  About 25yr ago due to Crohn's BMD:   none TDaP:  *** Shingles: no Pneumonia: no Hep C and HIV: HIV neg 2017 Labs: ***   reports that she has never smoked. She has never used smokeless tobacco. She reports that she does not drink alcohol or use drugs.  Past Medical History:  Diagnosis Date  . Anxiety   . Crohn disease ([place]   . Depression   . Fibroid   . Infertility, female     Past Surgical History:  Procedure Laterality Date  . 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    Current Outpatient Medications  Medication Sig Dispense Refill  . ibuprofen (ADVIL,MOTRIN) 600 MG tablet Take 1 tablet (600 mg total) by mouth every 6 (six) hours as needed. 30 tablet 0  . traZODone (DESYREL) 50 MG tablet TK 1 T PO QHS  1   No current facility-administered medications for this visit.     Family History  Problem Relation Age of Onset  . Hyperlipidemia Mother   . Hypertension Mother   . Crohn's disease Father   . Crohn's disease Brother   . Bone cancer Maternal Grandfather     ROS:  Pertinent items are noted in HPI.  Otherwise, a comprehensive ROS was negative.  Exam:   There were no vitals taken for this visit.   Ht Readings from Last 3 Encounters:  05/31/17 5' 2"  (1.575 m)  02/23/17 5' 5"  (1.651 m)  02/20/17 5' 5"  (1.651 m)    General appearance: alert, cooperative and appears stated age Head: Normocephalic, without obvious abnormality, atraumatic Neck: no adenopathy, supple, symmetrical, trachea midline  and thyroid {EXAM; THYROID:18604} Lungs: clear to auscultation bilaterally Breasts: {Exam; breast:13139::"normal appearance, no masses or tenderness"} Heart: regular rate and rhythm Abdomen: soft, non-tender; no masses,  no organomegaly Extremities: extremities normal, atraumatic, no cyanosis or edema Skin: Skin color, texture, turgor normal. No rashes or lesions Lymph nodes: Cervical, supraclavicular, and axillary nodes normal. No abnormal inguinal nodes palpated Neurologic: Grossly normal   Pelvic: External genitalia:  no lesions              Urethra:  normal appearing urethra with no masses, tenderness or lesions              Bartholin's and Skene's: normal                 Vagina: normal appearing vagina with normal color and discharge, no lesions              Cervix: {exam; cervix:14595}              Pap taken: {yes no:314532} Bimanual Exam:  Uterus:  {exam; uterus:12215}              Adnexa: {exam; adnexa:12223}               Rectovaginal: Confirms  Anus:  normal sphincter tone, no lesions  Chaperone present: ***  A:  Well Woman with normal exam  P:   Reviewed health and wellness pertinent to exam  Pap smear: {YES NO:22349}  {plan; gyn:5269::"mammogram","pap smear","return annually or prn"}  An After Visit Summary was printed and given to the patient.

## 2018-05-29 ENCOUNTER — Other Ambulatory Visit: Payer: Self-pay

## 2018-05-29 ENCOUNTER — Ambulatory Visit (INDEPENDENT_AMBULATORY_CARE_PROVIDER_SITE_OTHER): Payer: BLUE CROSS/BLUE SHIELD | Admitting: Certified Nurse Midwife

## 2018-05-29 ENCOUNTER — Encounter: Payer: Self-pay | Admitting: Certified Nurse Midwife

## 2018-05-29 ENCOUNTER — Other Ambulatory Visit (HOSPITAL_COMMUNITY)
Admission: RE | Admit: 2018-05-29 | Discharge: 2018-05-29 | Disposition: A | Discharge status: Home / Self Care | Payer: BLUE CROSS/BLUE SHIELD | Source: Ambulatory Visit | Attending: Obstetrics & Gynecology | Admitting: Obstetrics & Gynecology

## 2018-05-29 VITALS — BP 102/68 | HR 80 | Resp 16 | Ht 64.75 in | Wt 227.0 lb

## 2018-05-29 DIAGNOSIS — Z0000 Encounter for general adult medical examination without abnormal findings: Secondary | ICD-10-CM | POA: Diagnosis not present

## 2018-05-29 DIAGNOSIS — N898 Other specified noninflammatory disorders of vagina: Secondary | ICD-10-CM

## 2018-05-29 DIAGNOSIS — Z01419 Encounter for gynecological examination (general) (routine) without abnormal findings: Secondary | ICD-10-CM

## 2018-05-29 DIAGNOSIS — E559 Vitamin D deficiency, unspecified: Secondary | ICD-10-CM

## 2018-05-29 DIAGNOSIS — Z124 Encounter for screening for malignant neoplasm of cervix: Secondary | ICD-10-CM

## 2018-05-29 DIAGNOSIS — Z23 Encounter for immunization: Secondary | ICD-10-CM | POA: Diagnosis not present

## 2018-05-29 DIAGNOSIS — E663 Overweight: Secondary | ICD-10-CM

## 2018-05-29 DIAGNOSIS — Z113 Encounter for screening for infections with a predominantly sexual mode of transmission: Secondary | ICD-10-CM

## 2018-05-29 DIAGNOSIS — Z86018 Personal history of other benign neoplasm: Secondary | ICD-10-CM

## 2018-05-29 DIAGNOSIS — N852 Hypertrophy of uterus: Secondary | ICD-10-CM

## 2018-05-29 NOTE — Addendum Note (Signed)
Addended by: Regina Eck on: 05/29/2018 01:28 PM   Modules accepted: Orders

## 2018-05-29 NOTE — Patient Instructions (Signed)

## 2018-05-29 NOTE — Progress Notes (Addendum)
37 y.o. G0P0000 Married  African American Fe here for annual exam. Periods normal , no issues. Patient has gained weight in last year 19 pounds, working on exercise for weight loss now. Patient had fibroids noted at last visit and had partial removal of fibroids and was told at check up she still had some remaining, per MRI 6/19. Planning to do this year with Dr. Doreen Salvage) later this year, once all testing and evaluation done. She continues to have fullness from fibroids, but no change in periods. Has noted some change in discharge and would like to have checked, denies itching, slight odor for ?duration. No new personal products.[facility] for medication management for depression and anxiety, all stable. Sees Urgent care if needed.Would like screening labs if needed. No other health issues today.  Patient's last menstrual period was 05/17/2018 (exact date).          Sexually active: Yes.    The current method of family planning is none.    Exercising: Yes.    treadmill Smoker:  no  Review of Systems  Constitutional: Negative.   HENT: Negative.   Eyes: Negative.   Respiratory: Negative.   Cardiovascular: Negative.   Gastrointestinal: Negative.   Genitourinary: Negative.   Musculoskeletal: Negative.   Skin: Negative.   Neurological: Negative.   Endo/Heme/Allergies: Negative.   Psychiatric/Behavioral: Negative.     Health Maintenance: Pap:  02-20-17 neg HPV HR neg History of Abnormal Pap: no MMG:  none Self Breast exams: occ Colonoscopy:  74yr ago due to Crohn's BMD:   none TDaP:  ? 2007 Shingles: no Pneumonia: no Hep C and HIV: HIV neg 2017 Labs: yes   reports that she has never smoked. She has never used smokeless tobacco. She reports that she does not drink alcohol or use drugs.  Past Medical History:  Diagnosis Date  . Anxiety   . Crohn disease ([place]   . Depression   . Fibroid   . Infertility, female     Past Surgical History:  Procedure  Laterality Date  . 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  .Marland KitchenMYOMECTOMY  2019    Current Outpatient Medications  Medication Sig Dispense Refill  . ARIPiprazole (ABILIFY) 15 MG tablet Take by mouth.    .Marland KitchenBIOTIN PO Take by mouth.    . dicyclomine (BENTYL) 20 MG tablet Take by mouth.    . gabapentin (NEURONTIN) 100 MG capsule Take by mouth.    .Marland Kitchenibuprofen (ADVIL,MOTRIN) 600 MG tablet Take 1 tablet (600 mg total) by mouth every 6 (six) hours as needed. 30 tablet 0  . Multiple Vitamins-Minerals (MULTIVITAMIN PO) Take by mouth.    . traZODone (DESYREL) 100 MG tablet      No current facility-administered medications for this visit.     Family History  Problem Relation Age of Onset  . Hyperlipidemia Mother   . Hypertension Mother   . Crohn's disease Father   . Crohn's disease Brother   . Bone cancer Maternal Grandfather     ROS:  Pertinent items are noted in HPI.  Otherwise, a comprehensive ROS was negative.  Exam:   BP 102/68   Pulse 80   Resp 16   Ht 5' 4.75" (1.645 m)   Wt 227 lb (103 kg)   LMP 05/17/2018 (Exact Date)   BMI 38.07 kg/m  Height: 5' 4.75" (164.5 cm) Ht Readings  from Last 3 Encounters:  05/29/18 5' 4.75" (1.645 m)  05/31/17 5' 2"  (1.575 m)  02/23/17 5' 5"  (1.651 m)    General appearance: alert, cooperative and appears stated age Head: Normocephalic, without obvious abnormality, atraumatic Neck: no adenopathy, supple, symmetrical, trachea midline and thyroid normal to inspection and palpation Lungs: clear to auscultation bilaterally Breasts: normal appearance, no masses or tenderness, No nipple retraction or dimpling, No nipple discharge or bleeding, No axillary or supraclavicular adenopathy Heart: regular rate and rhythm Abdomen: soft, non-tender; no masses,  no organomegaly Extremities: extremities normal, atraumatic, no cyanosis or edema Skin: Skin color, texture, turgor normal. No rashes or  lesions Lymph nodes: Cervical, supraclavicular, and axillary nodes normal. No abnormal inguinal nodes palpated Neurologic: Grossly normal   Pelvic: External genitalia:  no lesions,normal female appearance              Urethra:  normal appearing urethra with no masses, tenderness or lesions              Bartholin's and Skene's: normal                 Vagina: normal appearing vagina with normal white color and non odorous discharge, no lesions, Affirm taken              Cervix: anteverted, no cervical motion tenderness, no lesions and nulliparous appearance              Pap taken: Yes.   Bimanual Exam:  Uterus:  enlarged, 15-16 weeks size              Adnexa: no mass, fullness, tenderness and adnexa not palpable               Rectovaginal: Confirms               Anus:  normal sphincter tone, no lesions  Chaperone present: yes  A:  Well Woman with normal exam  Contraception none due to infertility history with fibroids  History of fibroid removal, but incomplete due to nature of fibroids found, still under follow up with GYN in [facility].  Vaginal odor  Depression and anxiety with MD management  Immunization due  Screening labs  P:   Reviewed health and wellness pertinent to exam  Discussed fullness and size of fibroids per abdominal exam and per MRI. Encouraged to continue follow up with with Dr. Alford Highland as indicated.  R/O vaginal infection  Lab: affirm  Continue MD follow up with medication management.  Requests TDAP  Lab; STD panel, Hep C,(patient request) Lipid panel, Vitamin D, declines Gc/chlamydia(recently done)  Pap smear: yes   counseled on breast self exam, STD prevention, HIV risk factors and prevention, adequate intake of calcium and vitamin D, diet and exercise  return annually or prn  An After Visit Summary was printed and given to the patient.

## 2018-05-30 ENCOUNTER — Other Ambulatory Visit: Payer: Self-pay | Admitting: Certified Nurse Midwife

## 2018-05-30 LAB — LIPID PANEL
CHOL/HDL RATIO: 5 ratio — AB (ref 0.0–4.4)
CHOLESTEROL TOTAL: 196 mg/dL (ref 100–199)
HDL: 39 mg/dL — AB (ref >39)
LDL Calculated: 122 mg/dL — ABNORMAL HIGH (ref 0–99)
TRIGLYCERIDES: 177 mg/dL — AB (ref 0–149)
VLDL Cholesterol Cal: 35 mg/dL (ref 5–40)

## 2018-05-30 LAB — CYTOLOGY - PAP: Diagnosis: NEGATIVE

## 2018-05-30 LAB — HEP, RPR, HIV PANEL
HIV SCREEN 4TH GENERATION: NONREACTIVE
Hepatitis B Surface Ag: NEGATIVE
RPR Ser Ql: NONREACTIVE

## 2018-05-30 LAB — VAGINITIS/VAGINOSIS, DNA PROBE
Candida Species: NEGATIVE
Gardnerella vaginalis: NEGATIVE
TRICHOMONAS VAG: NEGATIVE

## 2018-05-30 LAB — VITAMIN D 1,25 DIHYDROXY

## 2018-05-30 LAB — HEPATITIS C ANTIBODY

## 2018-06-01 ENCOUNTER — Telehealth: Payer: Self-pay | Admitting: *Deleted

## 2018-06-01 DIAGNOSIS — E7889 Other lipoprotein metabolism disorders: Secondary | ICD-10-CM

## 2018-06-01 DIAGNOSIS — E663 Overweight: Secondary | ICD-10-CM

## 2018-06-01 NOTE — Telephone Encounter (Signed)
Notes recorded by Burnice Logan, RN on 06/01/2018 at 9:41 AM EDT Left message to call Sharee Pimple at [phone].

## 2018-06-01 NOTE — Telephone Encounter (Signed)
Patient returning Jill's call.  °

## 2018-06-01 NOTE — Telephone Encounter (Signed)
Spoke with patient, advised per Melvia Heaps, CNM. Patient accepts referral to PCP, order placed to Dr. [name]. Patient aware referral coordinator will f/u with appointment details once scheduled. Next AEX 05/31/19. Patient verbalizes understanding and is agreeable.   Routing to [employer] .   Encounter closed.

## 2018-06-01 NOTE — Telephone Encounter (Signed)
-----   Message from Regina Eck, CNM sent at 05/31/2018  7:42 AM EDT ----- Notify patient her vaginal screen was negative for yeast, BV and trichomonas Lipid profile shows normal cholesterol, Triglycerides elevated at 177 normal is <149 HDL is borderline normal, LDL is elevated normal <99 She has gained weight over the past year, which has probably contributed to lab elevations, needs referral to PCP if she does not have one for help with management. Will lab copies Hep B,C, HIV, RPR are negative Pap smear negative 02

## 2018-06-01 NOTE — Telephone Encounter (Signed)
Patient returned a call to Jill.   

## 2018-06-04 LAB — SPECIMEN STATUS REPORT

## 2018-06-04 LAB — VITAMIN D 25 HYDROXY (VIT D DEFICIENCY, FRACTURES): Vit D, 25-Hydroxy: 29.1 ng/mL — ABNORMAL LOW (ref 30.0–100.0)

## 2018-06-05 ENCOUNTER — Other Ambulatory Visit: Payer: Self-pay

## 2018-06-05 DIAGNOSIS — E559 Vitamin D deficiency, unspecified: Secondary | ICD-10-CM

## 2018-07-31 NOTE — Progress Notes (Signed)
Vicki Lee is a 37 y.o. female is here to [place].   Patient Care Team: [name], DO as PCP - General (Family Medicine) Lesia Sago, MD as Referring Physician (Obstetrics and Gynecology) Beverly Sessions   History of Present Illness:   Lonell Grandchild, CMA acting as scribe for Dr. [name].   HPI: Patient in office today to establish care. She was referred by Dr. Sabra Heck. She has not had a PCP in the past. She does have history of anxiety, depression, and schizophrenia. She currently is taking Abilify 63m, Trazodone 1046mnightly as well Hydroxyzine prn. [facility] Patient complains of weight gain since psychiatric diagnoses and medication initiation > 1 year ago. Previous and comfortable weight is 180 pounds.  Usual eating pattern includes: The patient eats a regular, healthy diet. Usual physical activity includes yoga. Current life stressors: in school - Women Studies, mental health scares. Associated medical conditions: hyperlipidemia, depression. Associated medications: Trazodone, Abilify, Hydroxyzine. History of eating disorders: none. Previous treatments for obesity include: self-directed dieting.  Contraindications to weight loss: none. Patient readiness to commit to diet and activity changes: excellent.   Health Maintenance Due  Topic Date Due  . INFLUENZA VACCINE  04/12/2018   No flowsheet data found.  PMHx, SurgHx, SocialHx, Medications, and Allergies were reviewed in the Visit Navigator and updated as appropriate.   Past Medical History:  Diagnosis Date  . Anxiety   . Crohn disease ([place]  . Depression   . Fibroid   . Infertility, female      Past Surgical History:  Procedure Laterality Date  . COLON SURGERY  2000    due to Chron's age 37 . 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  . Marland KitchenYOMECTOMY  2019     Family History  Problem Relation Age of Onset  . Hyperlipidemia Mother   . Hypertension Mother   . Crohn's disease  Father   . Crohn's disease Brother   . Mental illness Maternal Grandmother   . Bone cancer Maternal Grandfather   . Mental illness Paternal Grandmother     Social History   Tobacco Use  . Smoking status: Never Smoker  . Smokeless tobacco: Never Used  Substance Use Topics  . Alcohol use: No  . Drug use: No    Current Medications and Allergies:   Current Outpatient Medications:  .  ARIPiprazole (ABILIFY) 15 MG tablet, Take by mouth., Disp: , Rfl:  .  Multiple Vitamins-Minerals (MULTIVITAMIN PO), Take by mouth., Disp: , Rfl:  .  traZODone (DESYREL) 100 MG tablet, , Disp: , Rfl:  .  hydrOXYzine (VISTARIL) 25 MG capsule, , Disp: , Rfl:  .  neomycin-polymyxin b-dexamethasone (MAXITROL) 3.5-10000-0.1 OINT, USE ON RIGHT LOWER EYE LID BID FOR 14 DAYS, Disp: , Rfl: 0  No Known Allergies Review of Systems:   Pertinent items are noted in the HPI. Otherwise, ROS is negative.  Vitals:   Vitals:   08/01/18 1317  BP: 108/64  Pulse: 82  Temp: 98.6 F (37 C)  TempSrc: Oral  SpO2: 96%  Weight: 227 lb 6.4 oz (103.1 kg)  Height: 5' 4.75" (1.645 m)     Body mass index is 38.13 kg/m.  Physical Exam:   Physical Exam  Constitutional: She appears well-nourished.  HENT:  Head: Normocephalic and atraumatic.  Eyes: Pupils are equal, round, and reactive to light. EOM are normal.  Neck: Normal range of motion. Neck supple.  Cardiovascular: Normal rate, regular  rhythm, normal heart sounds and intact distal pulses.  Pulmonary/Chest: Effort normal.  Abdominal: Soft.  Skin: Skin is warm.  Psychiatric: She has a normal mood and affect. Her behavior is normal.  Nursing note and vitals reviewed.   Assessment and Plan:   1. Hyperglycemia Insulin level pending. Will consider Metformin and/or GLP1RA for help minimizing metabolic effect of psychiatric medicines and for weight loss.   2. Weight gain Patient exercising. Offered referral to RD.   3. Morbid obesity ([place]) Labs pending. Need  to watch obesogenic medications.   4. Iron deficiency anemia due to chronic blood loss TIBC pending.   5. Anxiety, with prn Hydroxyzine Consider Propranolol as alternative.  6. Intramural, submucous, and subserous leiomyoma of uterus Followed by GYN.  7. Paranoid schizophrenia ([place]) Followed by Psychiatry.  Orders Placed This Encounter  Procedures  . Insulin, Free (Bioactive)  . Iron, TIBC and Ferritin Panel  . POCT HgB A1C   . Reviewed expectations re: course of current medical issues. . Discussed self-management of symptoms. . Outlined signs and symptoms indicating need for more acute intervention. . Patient verbalized understanding and all questions were answered. Marland Kitchen Health Maintenance issues including appropriate healthy diet, exercise, and smoking avoidance were discussed with patient. . See orders for this visit as documented in the electronic medical record. . Patient received an After Visit Summary.  CMA served as Education administrator during this visit. History, Physical, and Plan performed by medical provider. The above documentation has been reviewed and is accurate and complete. [name], D.O.  [name], DO [place], [facility] 08/02/2018

## 2018-08-01 ENCOUNTER — Ambulatory Visit (INDEPENDENT_AMBULATORY_CARE_PROVIDER_SITE_OTHER): Payer: Self-pay | Admitting: Family Medicine

## 2018-08-01 ENCOUNTER — Encounter: Payer: Self-pay | Admitting: Family Medicine

## 2018-08-01 VITALS — BP 108/64 | HR 82 | Temp 98.6°F | Ht 64.75 in | Wt 227.4 lb

## 2018-08-01 DIAGNOSIS — R739 Hyperglycemia, unspecified: Secondary | ICD-10-CM

## 2018-08-01 DIAGNOSIS — D649 Anemia, unspecified: Secondary | ICD-10-CM

## 2018-08-01 DIAGNOSIS — R635 Abnormal weight gain: Secondary | ICD-10-CM | POA: Insufficient documentation

## 2018-08-01 DIAGNOSIS — D252 Subserosal leiomyoma of uterus: Secondary | ICD-10-CM

## 2018-08-01 DIAGNOSIS — D250 Submucous leiomyoma of uterus: Secondary | ICD-10-CM

## 2018-08-01 DIAGNOSIS — F419 Anxiety disorder, unspecified: Secondary | ICD-10-CM | POA: Insufficient documentation

## 2018-08-01 DIAGNOSIS — E669 Obesity, unspecified: Secondary | ICD-10-CM

## 2018-08-01 DIAGNOSIS — D251 Intramural leiomyoma of uterus: Secondary | ICD-10-CM

## 2018-08-01 DIAGNOSIS — F200 Paranoid schizophrenia: Secondary | ICD-10-CM

## 2018-08-01 DIAGNOSIS — D500 Iron deficiency anemia secondary to blood loss (chronic): Secondary | ICD-10-CM

## 2018-08-01 LAB — POCT GLYCOSYLATED HEMOGLOBIN (HGB A1C): Hemoglobin A1C: 5.4 % (ref 4.0–5.6)

## 2018-08-01 NOTE — Assessment & Plan Note (Addendum)
History: Associated signs & symptoms: none.  CBC Latest Ref Rng & Units 05/31/2017 05/17/2016 01/06/2016  WBC 4.0 - 10.5 K/uL 7.4 7.6 7.8  Hemoglobin 12.0 - 15.0 g/dL 9.8(L) 13.5 13.6  Hematocrit 36.0 - 46.0 % 31.6(L) 40.0 40.9  Platelets 150 - 400 K/uL 521(H) 325 382   No results found for: IRON, TIBC, FERRITIN   Assessment/Plan:       Iron deficiency anemia due to vaginal bleeding. Plan: see orders in EMR.

## 2018-08-02 ENCOUNTER — Encounter: Payer: Self-pay | Admitting: *Deleted

## 2018-08-02 NOTE — Assessment & Plan Note (Signed)
History: Current treatment includes Abilify, Trazodone, and Hydroxyzine - all medications associated with weight gain and metabolic syndrome.  Assessment/Plan: Will d/w Psychiatrist and see if Geodon, Propranolol would be suitable alternatives.

## 2018-08-03 ENCOUNTER — Ambulatory Visit (INDEPENDENT_AMBULATORY_CARE_PROVIDER_SITE_OTHER): Payer: BLUE CROSS/BLUE SHIELD | Admitting: Certified Nurse Midwife

## 2018-08-03 ENCOUNTER — Other Ambulatory Visit: Payer: Self-pay

## 2018-08-03 ENCOUNTER — Encounter: Payer: Self-pay | Admitting: Certified Nurse Midwife

## 2018-08-03 VITALS — BP 120/72 | HR 68 | Resp 16 | Wt 229.0 lb

## 2018-08-03 DIAGNOSIS — N898 Other specified noninflammatory disorders of vagina: Secondary | ICD-10-CM

## 2018-08-03 DIAGNOSIS — N760 Acute vaginitis: Secondary | ICD-10-CM

## 2018-08-03 LAB — IRON,TIBC AND FERRITIN PANEL
%SAT: 9 % (calc) — ABNORMAL LOW (ref 16–45)
Ferritin: 5 ng/mL — ABNORMAL LOW (ref 16–154)
Iron: 33 ug/dL — ABNORMAL LOW (ref 40–190)
TIBC: 355 mcg/dL (calc) (ref 250–450)

## 2018-08-03 NOTE — Progress Notes (Signed)
37 y.o. Married [place] American female G0P0000 here with complaint of vaginal symptoms of itching, irritation at clitoral area. No burning, Some cottage cheese like increased discharge. No odor to discharge.. Onset of symptoms 14 days ago. Denies new personal products or vaginal dryness. But has switched to body wash instead of bar soap. No STD concerns. Urinary symptoms none . Contraception none.  Review of Systems  Constitutional: Negative.   HENT: Negative.   Eyes: Negative.   Respiratory: Negative.   Cardiovascular: Negative.   Gastrointestinal: Negative.   Genitourinary:       Vaginal irritation & itching  Musculoskeletal: Negative.   Skin: Negative.   Neurological: Negative.   Endo/Heme/Allergies: Negative.   Psychiatric/Behavioral: Negative.     O:Healthy female WDWN Affect: normal, orientation x 3  Exam:Skin warm and dry Abdomen:soft,  Non tender  Inguinal Lymph nodes: no enlargement or tenderness Pelvic exam: External genital: normal female with some tenderness and slight increase pink of left vulva, no scaling or exudate BUS: negative Vagina: scant white non odorous discharge noted.  Affirm taken Cervix: normal, non tender, no CMT Uterus: normal, non tender Adnexa:normal, non tender, no masses or fullness noted   A:Normal pelvic exam R/O vaginal infection  P:Discussed findings of normal pelvic exam and etiology. Discussed Aveeno or baking soda sitz bath for comfort. Avoid moist clothes  extended period of time.Hulan Fray soap use for less skin irritation. Lab: Affirm will treat if indicated.

## 2018-08-04 LAB — VAGINITIS/VAGINOSIS, DNA PROBE
CANDIDA SPECIES: POSITIVE — AB
GARDNERELLA VAGINALIS: NEGATIVE
Trichomonas vaginosis: NEGATIVE

## 2018-08-05 ENCOUNTER — Other Ambulatory Visit: Payer: Self-pay | Admitting: Certified Nurse Midwife

## 2018-08-05 DIAGNOSIS — B3731 Acute candidiasis of vulva and vagina: Secondary | ICD-10-CM

## 2018-08-05 DIAGNOSIS — B373 Candidiasis of vulva and vagina: Secondary | ICD-10-CM

## 2018-08-05 MED ORDER — FLUCONAZOLE 150 MG PO TABS: ORAL_TABLET | ORAL | 0 refills | Status: DC

## 2018-08-06 ENCOUNTER — Other Ambulatory Visit: Payer: Self-pay

## 2018-08-06 DIAGNOSIS — D500 Iron deficiency anemia secondary to blood loss (chronic): Secondary | ICD-10-CM

## 2018-08-08 LAB — INSULIN, FREE (BIOACTIVE): Insulin, Free: 5.3 u[IU]/mL (ref 1.5–14.9)

## 2018-08-15 ENCOUNTER — Ambulatory Visit (HOSPITAL_COMMUNITY)
Admission: RE | Admit: 2018-08-15 | Discharge: 2018-08-15 | Disposition: A | Discharge status: Home / Self Care | Payer: BLUE CROSS/BLUE SHIELD | Source: Ambulatory Visit | Attending: Family Medicine | Admitting: Family Medicine

## 2018-08-15 DIAGNOSIS — D500 Iron deficiency anemia secondary to blood loss (chronic): Secondary | ICD-10-CM | POA: Diagnosis present

## 2018-08-15 MED ORDER — FERUMOXYTOL INJECTION 510 MG/17 ML: 510.0000 mg | Freq: Once | INTRAVENOUS | Status: DC

## 2018-08-15 MED ORDER — SODIUM CHLORIDE 0.9 % IV SOLN
510.0000 mg | Freq: Once | INTRAVENOUS | Status: AC
  Administered 2018-08-15: 510 mg via INTRAVENOUS
  Filled 2018-08-15: qty 17

## 2018-08-15 MED ORDER — SODIUM CHLORIDE 0.9 % IV SOLN
INTRAVENOUS | Status: DC | PRN
  Administered 2018-08-15: 250 mL via INTRAVENOUS

## 2018-08-15 NOTE — Progress Notes (Signed)
PATIENT CARE CENTER NOTE  Diagnosis: Iron Deficiency Anemia    Provider: Dr. [name]   Procedure: IV Feraheme    Note: Patient received Feraheme infusion. Monitored for 30 minutes post-infusion. Tolerated well with no adverse reaction. Vital signs stable. Discharge instructions given. Patient alert, oriented and ambulatory at discharge.

## 2018-08-15 NOTE — Discharge Instructions (Signed)

## 2018-08-21 ENCOUNTER — Encounter (HOSPITAL_COMMUNITY): Payer: Self-pay

## 2018-08-28 ENCOUNTER — Encounter (HOSPITAL_COMMUNITY): Payer: Self-pay

## 2018-08-30 ENCOUNTER — Ambulatory Visit (HOSPITAL_COMMUNITY)
Admission: RE | Admit: 2018-08-30 | Discharge: 2018-08-30 | Disposition: A | Discharge status: Home / Self Care | Payer: BLUE CROSS/BLUE SHIELD | Source: Ambulatory Visit | Attending: Family Medicine | Admitting: Family Medicine

## 2018-08-30 DIAGNOSIS — D500 Iron deficiency anemia secondary to blood loss (chronic): Secondary | ICD-10-CM | POA: Diagnosis not present

## 2018-08-30 MED ORDER — SODIUM CHLORIDE 0.9 % IV SOLN
INTRAVENOUS | Status: DC | PRN
  Administered 2018-08-30: 250 mL via INTRAVENOUS

## 2018-08-30 MED ORDER — SODIUM CHLORIDE 0.9 % IV SOLN
510.0000 mg | Freq: Once | INTRAVENOUS | Status: AC
  Administered 2018-08-30: 510 mg via INTRAVENOUS
  Filled 2018-08-30: qty 17

## 2018-08-30 NOTE — Progress Notes (Signed)
Patient received Feraheme via a PIV. Observed for at least 30 minutes post infusion.Tolerated well, vitals stable, discharge instructions given, verbalized understanding. Patient alert, oriented and ambulatory at the time of discharge.

## 2018-08-30 NOTE — Discharge Instructions (Signed)

## 2018-09-06 ENCOUNTER — Telehealth: Payer: Self-pay | Admitting: Family Medicine

## 2018-09-06 NOTE — Telephone Encounter (Signed)
See note. No referral in system  Copied from [place] [id number]. Topic: Referral - Question >> Sep 06, 2018  3:49 PM Rayann Heman wrote: Reason for CRM: pt called and stated that Dr Juleen China was going to refer her to a GI because of stomach pain. Please advise

## 2018-09-07 ENCOUNTER — Other Ambulatory Visit: Payer: Self-pay

## 2018-09-07 DIAGNOSIS — K5900 Constipation, unspecified: Secondary | ICD-10-CM

## 2018-09-07 NOTE — Telephone Encounter (Signed)
Okay referral to GI for chronic abdominal pain. I believe that I was waiting to see if GYN intervention resolved the pain. No issue with seeing both, though.

## 2018-09-07 NOTE — Telephone Encounter (Signed)
Do not see where it was started or anything in note.

## 2018-09-07 NOTE — Telephone Encounter (Signed)
Referral started l/m ok per dpr to let patent know.

## 2018-09-11 ENCOUNTER — Encounter: Payer: Self-pay | Admitting: Certified Nurse Midwife

## 2018-09-11 ENCOUNTER — Telehealth: Payer: Self-pay | Admitting: Certified Nurse Midwife

## 2018-09-11 ENCOUNTER — Other Ambulatory Visit: Payer: Self-pay

## 2018-09-11 ENCOUNTER — Ambulatory Visit (INDEPENDENT_AMBULATORY_CARE_PROVIDER_SITE_OTHER): Payer: BLUE CROSS/BLUE SHIELD | Admitting: Certified Nurse Midwife

## 2018-09-11 VITALS — BP 116/62 | HR 68 | Temp 98.0°F | Resp 16 | Wt 228.0 lb

## 2018-09-11 DIAGNOSIS — Z0001 Encounter for general adult medical examination with abnormal findings: Secondary | ICD-10-CM

## 2018-09-11 DIAGNOSIS — Z86018 Personal history of other benign neoplasm: Secondary | ICD-10-CM

## 2018-09-11 DIAGNOSIS — N898 Other specified noninflammatory disorders of vagina: Secondary | ICD-10-CM | POA: Diagnosis not present

## 2018-09-11 DIAGNOSIS — N761 Subacute and chronic vaginitis: Secondary | ICD-10-CM | POA: Diagnosis not present

## 2018-09-11 DIAGNOSIS — N939 Abnormal uterine and vaginal bleeding, unspecified: Secondary | ICD-10-CM | POA: Diagnosis not present

## 2018-09-11 DIAGNOSIS — Z3202 Encounter for pregnancy test, result negative: Secondary | ICD-10-CM

## 2018-09-11 DIAGNOSIS — N926 Irregular menstruation, unspecified: Secondary | ICD-10-CM

## 2018-09-11 LAB — POCT URINE PREGNANCY: Preg Test, Ur: NEGATIVE

## 2018-09-11 NOTE — Progress Notes (Signed)
37 y.o. Married [place] American female G0P0000 here with complaint of vaginal symptoms of irritation, burning, and increase discharge. Describes discharge as white with no odor which started a week ago. Has increased water intake to see if this would help, but no change.. Has tried Aveeno sitz bath with slight improvement. Has been purchasing new underwear and to see if this would help, but did not wash before wearing. Urinary symptoms none . Contraception is none. Nausea has been occurring again with decrease food intake and is waiting on referral from PCP to GI for evaluation. Pregnancy test done today due to no contraception and spotting that started today. UPT negative.  No other health issues today.     Review of Systems  Constitutional: Negative.   HENT: Negative.   Eyes: Negative.   Respiratory: Negative.   Cardiovascular: Negative.   Gastrointestinal: Positive for nausea.  Genitourinary:       Vaginal itching,irritation, odor  Musculoskeletal: Negative.   Skin: Negative.   Neurological: Negative.   Endo/Heme/Allergies: Negative.   Psychiatric/Behavioral: Negative.     O:Healthy female WDWN Affect: normal, orientation x 3  Exam: Skin: warm and dry Abdomen:soft, non tender, no masses  inguinal Lymph nodes: no enlargement or tenderness Pelvic exam: External genital: normal female BUS: negative Vagina: bloody discharge noted. Affirm taken Cervix: normal, non tender, no CMT with period blood noted from cervix Uterus: normal, non tender, slightly enlarged history of fibroid Adnexa:normal, non tender, no masses or fullness noted  poct upt-neg  A:Normal pelvic exam Menses present History of slightly enlarged uterus due to fibroid R/O vaginal infection Awaiting referral to GI due to persistent nausea and constipation   P:Discussed findings of normal pelvic exam and period blood noted. Discussed Aveeno or baking soda sitz bath for comfort with vulva irritation.Marland Kitchen Avoid moist  clothes or pads for extended period of time. Discussed importance of washing new underwear prior to wearing.  Lab: Affirm Discussed calling PCP again regarding referral to GI if symptoms have increased. Questions addressed.  Rv prn

## 2018-09-11 NOTE — Patient Instructions (Signed)

## 2018-09-11 NOTE — Telephone Encounter (Signed)
Spoke with patient. Patient reports vaginal irritation, spotting and vaginal odor, pelvic discomfort and nausea. Is unsure if spotting is blood or vaginal d/c. Symptoms started 4 days ago. LMP unsure of exact date, ended 2nd wk of December. SA, no contraceptive. Has not check UPT. Treated for yeast 11/22, states symptoms are the same as before, did not resolve with diflucan x2. Requesting RX.   Recommended OV for further evaluation, OV scheduled for today at 12:45pm with Melvia Heaps, CNM.  Routing to provider for final review. Patient is agreeable to disposition. Will close encounter.

## 2018-09-11 NOTE — Telephone Encounter (Signed)
Patient called stating "my symptoms has not improved after taking the medication". Patient is asking for another prescription. Confirmed pharmacy on file.

## 2018-09-12 LAB — VAGINITIS/VAGINOSIS, DNA PROBE
Candida Species: POSITIVE — AB
Gardnerella vaginalis: NEGATIVE
Trichomonas vaginosis: NEGATIVE

## 2018-09-13 ENCOUNTER — Other Ambulatory Visit: Payer: Self-pay

## 2018-09-13 MED ORDER — TERCONAZOLE 0.4 % VA CREA: 1.0000 | TOPICAL_CREAM | Freq: Every day | VAGINAL | 0 refills | Status: AC

## 2018-09-20 NOTE — Progress Notes (Deleted)
Referring Provider: [name], DO Primary Care Physician:  [name], DO   Reason for Consultation: Constipation   IMPRESSION:  Crohn's of the terminal ileum and cecum    -Diagnosed in [place] with right lower quadrant abdominal pain and had a right sided phlegmon noted on a CT of the abdomen and pelvis    -Colonoscopy 1998 revealed edematous, friable, narrowed terminal ileum without any other colonic mucosal involvement       -Biopsies of the TI and cecum revealed Crohn's ileitis and colitis    -Initially treated with steroids and Pentasa however unable to wean off steroids    -Ileocecal resection in April 1999, 16 cm of ileum were resected  PLAN: ***   HPI: Vicki Vicki Lee Vicki Lee is a 38 y.o. female seen in consultation at request of Dr. Juleen China for further evaluation of constipation.  The history is obtained through the patient and review of her electronic health record including Care Everywhere. She does have history of anxiety, depression, and schizophrenia. She was previously followed by GI at [facility] and was last seen in 2015.   She has Crohn's disease of the terminal ileum and cecum diagnosed in 1998. She was 38 years old at the time of initial diagnosis when she presented with RLQ abdominal pain and had a right-sided phlegmon noted on CT A/P. Subsequent colonoscopy revealed an edematous, friable, narrowed terminal ileum without any other other colonic mucosal involvement. Biopsies of the TI and cecum revealed colitis. She was treated with IV steroids and ultimately discharged on prednisone and pentasa. She was unable to be weaned completely off of steroids without return of her symptoms. She had an ileocecal resection in April 1999. There was note of inflammation and an enlarged appendix with dense adherence to the right lateral pelvic side wall. There was also note of several skip areas of Crohn's disease of the small bowel that were non-obstructing. She had the distal  16 cm of the ileum resected. She was discharged without need for medication. She did not follow up with GI until well over a year later when she was having abdominal pain post-prandial. She was restarted on Pentasa, but was lost to follow up. Seen at [facility] after an ER visit for abdominal pain, blood in the stool, and diarrhea.   Her family history is positive for her dad and brother having Crohn's disease.  Last colonoscopy - December 1998.    Past Medical History:  Diagnosis Date  . Anxiety   . Crohn disease ([place])   . Depression   . Fibroid   . Infertility, female     Past Surgical History:  Procedure Laterality Date  . COLON SURGERY  2000    due to Chron's age 47.  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  Marland Kitchen MYOMECTOMY  2019    Current Outpatient Medications  Medication Sig Dispense Refill  . ARIPiprazole (ABILIFY) 15 MG tablet Take by mouth.    . hydrOXYzine (VISTARIL) 25 MG capsule     . Multiple Vitamins-Minerals (MULTIVITAMIN PO) Take by mouth.    . terconazole (TERAZOL 7) 0.4 % vaginal cream Place 1 applicator vaginally at bedtime for 7 days. 45 g 0  . traZODone (DESYREL) 150 MG tablet      No current facility-administered medications for this visit.     Allergies as of 09/21/2018  . (No Known Allergies)    Family History  Problem Relation Age of Onset  .  Hyperlipidemia Mother   . Hypertension Mother   . Crohn's disease Father   . Crohn's disease Brother   . Mental illness Maternal Grandmother   . Bone cancer Maternal Grandfather   . Mental illness Paternal Grandmother     Social History   Socioeconomic History  . Marital status: Married    Spouse name: Not on file  . Number of children: Not on file  . Years of education: Not on file  . Highest education level: Not on file  Occupational History  . Not on file  Social Needs  . Financial resource strain: Not on file  . Food insecurity:    Worry: Not on file    Inability: Not on file  .  Transportation needs:    Medical: Not on file    Non-medical: Not on file  Tobacco Use  . Smoking status: Never Smoker  . Smokeless tobacco: Never Used  Substance and Sexual Activity  . Alcohol use: No  . Drug use: No  . Sexual activity: Yes    Partners: Male    Birth control/protection: None  Lifestyle  . Physical activity:    Days per week: Not on file    Minutes per session: Not on file  . Stress: Not on file  Relationships  . Social connections:    Talks on phone: Not on file    Gets together: Not on file    Attends religious service: Not on file    Active member of club or organization: Not on file    Attends meetings of clubs or organizations: Not on file    Relationship status: Not on file  . Intimate partner violence:    Fear of current or ex partner: Not on file    Emotionally abused: Not on file    Physically abused: Not on file    Forced sexual activity: Not on file  Other Topics Concern  . Not on file  Social History Narrative  . Not on file    Review of Systems: 12 system ROS is negative except as noted above.  There were no vitals filed for this visit.  Physical Exam: Vital signs were reviewed. General:   Alert, well-nourished, pleasant and cooperative in NAD Head:  Normocephalic and atraumatic. Eyes:  Sclera clear, no icterus.   Conjunctiva pink. Mouth:  No deformity or lesions.   Neck:  Supple; no thyromegaly. Lungs:  Clear throughout to auscultation.   No wheezes.  Heart:  Regular rate and rhythm; no murmurs Abdomen:  Soft, nontender, normal bowel sounds. No rebound or guarding. No hepatosplenomegaly Rectal:  Deferred  Msk:  Symmetrical without gross deformities. Extremities:  No gross deformities or edema. Neurologic:  Alert and  oriented x4;  grossly nonfocal Skin:  No rash or bruise. Psych:  Alert and cooperative. Normal mood and affect.   Vicki Vicki Lee Vicki Lee L. Tarri Glenn, MD, MPH [facility] 09/20/2018, 10:08 AM

## 2018-09-21 ENCOUNTER — Ambulatory Visit: Payer: BLUE CROSS/BLUE SHIELD | Admitting: Gastroenterology

## 2018-09-26 ENCOUNTER — Telehealth: Payer: Self-pay | Admitting: Family Medicine

## 2018-09-26 NOTE — Telephone Encounter (Signed)
I spoke to patient - her new insurance is [employer] which is out of network for [employer].  It says [place] on the front of the card, so she wants to know if Dr Juleen China has a recommendation for a provider in the [facility] network.  She would like someone to call her back.

## 2018-09-26 NOTE — Telephone Encounter (Signed)
Copied from [place] [phone]. Topic: Referral - Question >> Sep 26, 2018 11:19 AM Antonieta Iba C wrote: Pt called in to be advised. Pt says that she went to [facility] as referred, pt says that she was told that they don't accept her new insurance. They also advised pt that her PCP is no longer in network with her new insurance plan either. Pt would like to be advised. ALSO, pt is requesting to have a referral placed at a different GI location

## 2018-09-26 NOTE — Telephone Encounter (Signed)
Call patient per conversation with Dr Juleen China let her know that there is not any providers in that network she is familiar with. She will come by to fill our ROI when she picks a provider so that we can send records.

## 2018-09-28 ENCOUNTER — Emergency Department (HOSPITAL_BASED_OUTPATIENT_CLINIC_OR_DEPARTMENT_OTHER)
Admission: EM | Admit: 2018-09-28 | Discharge: 2018-09-28 | Disposition: A | Discharge status: Home / Self Care | Payer: BLUE CROSS/BLUE SHIELD | Attending: Emergency Medicine | Admitting: Emergency Medicine

## 2018-09-28 ENCOUNTER — Other Ambulatory Visit: Payer: Self-pay

## 2018-09-28 ENCOUNTER — Emergency Department (HOSPITAL_BASED_OUTPATIENT_CLINIC_OR_DEPARTMENT_OTHER): Payer: BLUE CROSS/BLUE SHIELD

## 2018-09-28 ENCOUNTER — Encounter (HOSPITAL_BASED_OUTPATIENT_CLINIC_OR_DEPARTMENT_OTHER): Payer: Self-pay

## 2018-09-28 DIAGNOSIS — R197 Diarrhea, unspecified: Secondary | ICD-10-CM | POA: Diagnosis not present

## 2018-09-28 DIAGNOSIS — Z79899 Other long term (current) drug therapy: Secondary | ICD-10-CM | POA: Diagnosis not present

## 2018-09-28 DIAGNOSIS — R112 Nausea with vomiting, unspecified: Secondary | ICD-10-CM | POA: Insufficient documentation

## 2018-09-28 DIAGNOSIS — R1030 Lower abdominal pain, unspecified: Secondary | ICD-10-CM | POA: Diagnosis not present

## 2018-09-28 LAB — URINALYSIS, ROUTINE W REFLEX MICROSCOPIC
BILIRUBIN URINE: NEGATIVE
Glucose, UA: NEGATIVE mg/dL
Ketones, ur: NEGATIVE mg/dL
Leukocytes, UA: NEGATIVE
NITRITE: NEGATIVE
Protein, ur: NEGATIVE mg/dL
Specific Gravity, Urine: >1.03 — ABNORMAL HIGH (ref 1.005–1.030)
pH: 5.5 (ref 5.0–8.0)

## 2018-09-28 LAB — CBC WITH DIFFERENTIAL/PLATELET
Abs Immature Granulocytes: 0.03 10*3/uL (ref 0.00–0.07)
Basophils Absolute: 0 10*3/uL (ref 0.0–0.1)
Basophils Relative: 0 %
EOS ABS: 0 10*3/uL (ref 0.0–0.5)
Eosinophils Relative: 0 %
HEMATOCRIT: 46.2 % — AB (ref 36.0–46.0)
Hemoglobin: 14.5 g/dL (ref 12.0–15.0)
IMMATURE GRANULOCYTES: 0 %
LYMPHS ABS: 0.8 10*3/uL (ref 0.7–4.0)
Lymphocytes Relative: 11 %
MCH: 27.3 pg (ref 26.0–34.0)
MCHC: 31.4 g/dL (ref 30.0–36.0)
MCV: 86.8 fL (ref 80.0–100.0)
MONOS PCT: 6 %
Monocytes Absolute: 0.4 10*3/uL (ref 0.1–1.0)
Neutro Abs: 5.6 10*3/uL (ref 1.7–7.7)
Neutrophils Relative %: 83 %
Platelets: 333 10*3/uL (ref 150–400)
RBC: 5.32 MIL/uL — ABNORMAL HIGH (ref 3.87–5.11)
RDW: 15.6 % — ABNORMAL HIGH (ref 11.5–15.5)
WBC: 6.8 10*3/uL (ref 4.0–10.5)
nRBC: 0 % (ref 0.0–0.2)

## 2018-09-28 LAB — COMPREHENSIVE METABOLIC PANEL
ALT: 19 U/L (ref 0–44)
AST: 16 U/L (ref 15–41)
Albumin: 3.8 g/dL (ref 3.5–5.0)
Alkaline Phosphatase: 79 U/L (ref 38–126)
Anion gap: 8 (ref 5–15)
BUN: 8 mg/dL (ref 6–20)
CO2: 21 mmol/L — ABNORMAL LOW (ref 22–32)
Calcium: 9.4 mg/dL (ref 8.9–10.3)
Chloride: 104 mmol/L (ref 98–111)
Creatinine, Ser: 1 mg/dL (ref 0.44–1.00)
GFR calc Af Amer: >60 mL/min (ref >60)
GFR calc non Af Amer: >60 mL/min (ref >60)
GLUCOSE: 115 mg/dL — AB (ref 70–99)
Potassium: 3.6 mmol/L (ref 3.5–5.1)
Sodium: 133 mmol/L — ABNORMAL LOW (ref 135–145)
Total Bilirubin: 0.4 mg/dL (ref 0.3–1.2)
Total Protein: 7.9 g/dL (ref 6.5–8.1)

## 2018-09-28 LAB — URINALYSIS, MICROSCOPIC (REFLEX)

## 2018-09-28 LAB — OCCULT BLOOD X 1 CARD TO LAB, STOOL: Fecal Occult Bld: NEGATIVE

## 2018-09-28 LAB — LIPASE, BLOOD: Lipase: 20 U/L (ref 11–51)

## 2018-09-28 LAB — PREGNANCY, URINE: Preg Test, Ur: NEGATIVE

## 2018-09-28 MED ORDER — HYDROCODONE-ACETAMINOPHEN 5-325 MG PO TABS: 1.0000 | ORAL_TABLET | Freq: Four times a day (QID) | ORAL | 0 refills | Status: AC | PRN

## 2018-09-28 MED ORDER — ONDANSETRON HCL 4 MG/2ML IJ SOLN
4.0000 mg | Freq: Once | INTRAMUSCULAR | Status: AC
  Administered 2018-09-28: 4 mg via INTRAVENOUS
  Filled 2018-09-28: qty 2

## 2018-09-28 MED ORDER — IOPAMIDOL (ISOVUE-300) INJECTION 61%
100.0000 mL | Freq: Once | INTRAVENOUS | Status: AC | PRN
  Administered 2018-09-28: 100 mL via INTRAVENOUS

## 2018-09-28 MED ORDER — SODIUM CHLORIDE 0.9 % IV BOLUS
1000.0000 mL | Freq: Once | INTRAVENOUS | Status: AC
  Administered 2018-09-28: 1000 mL via INTRAVENOUS

## 2018-09-28 MED ORDER — METOCLOPRAMIDE HCL 5 MG/ML IJ SOLN
10.0000 mg | Freq: Once | INTRAMUSCULAR | Status: AC
  Administered 2018-09-28: 10 mg via INTRAVENOUS
  Filled 2018-09-28: qty 2

## 2018-09-28 MED ORDER — IOPAMIDOL (ISOVUE-300) INJECTION 61%: 30.0000 mL | Freq: Once | INTRAVENOUS | Status: DC | PRN

## 2018-09-28 MED ORDER — MORPHINE SULFATE (PF) 4 MG/ML IV SOLN
4.0000 mg | Freq: Once | INTRAVENOUS | Status: AC
  Administered 2018-09-28: 4 mg via INTRAVENOUS
  Filled 2018-09-28: qty 1

## 2018-09-28 MED ORDER — ONDANSETRON HCL 4 MG PO TABS: 4.0000 mg | ORAL_TABLET | Freq: Four times a day (QID) | ORAL | 0 refills | Status: AC

## 2018-09-28 NOTE — ED Notes (Signed)
ED Provider at bedside. 

## 2018-09-28 NOTE — ED Triage Notes (Signed)
C/o abd pain,n/d x today-NAD-steady gait

## 2018-09-28 NOTE — ED Notes (Signed)
Pt understood dc material. NAD Noted. Scripts sent in electronically

## 2018-09-28 NOTE — ED Notes (Signed)
Pt ambulated to restroom without assistance. NAD noted

## 2018-09-28 NOTE — Discharge Instructions (Signed)
For severe pain, take 1-2 Norco every 6 hours.  Take Zofran every 6 hours as needed for nausea or vomiting.  Make sure to stay well-hydrated.  Please follow-up with your regular doctor or GI doctor for further evaluation and treatment of your symptoms.  Please return to the emergency department immediately if you develop any new or worsening symptoms including severe, intractable pain, intractable vomiting, fever over 100.4, or any other concerning symptoms.

## 2018-09-28 NOTE — ED Notes (Signed)
Per Radiology protocol, pt given 2 bottles contrast to drink prior to CT scan -- pt has h/o Crohn's;   Scan time approx 10:00pm

## 2018-09-28 NOTE — ED Provider Notes (Signed)
[facility] EMERGENCY DEPARTMENT Provider Note   CSN: [id number] Arrival date & time: 09/28/18  1654     History   Chief Complaint Chief Complaint  Patient presents with  . Abdominal Pain    HPI Vicki Lee is a 38 y.o. female with history of Crohn's disease, anxiety, depression who presents with a 1 day history of nausea, vomiting, diarrhea, and lower abdominal pain.  She reports her pain and symptoms began when she woke up this morning.  She did not take any medications at home for symptoms.  She is not currently on any medication for her Crohn's.  She has a GI appointment scheduled for early February.  She has not seen a GI doctor in a while.  She denies any urinary symptoms, abnormal vaginal bleeding or discharge.  She also denies any fevers, chest pain, shortness of breath.  She also reports some reported bloody stool.  HPI  Past Medical History:  Diagnosis Date  . Anxiety   . Crohn disease ([place])   . Depression   . Fibroid   . Infertility, female     Patient Active Problem List   Diagnosis Date Noted  . Weight gain 08/01/2018  . Anxiety, with prn Hydroxyzine 08/01/2018  . Iron deficiency anemia due to chronic blood loss 08/14/2017  . Crohn's disease ([place]) 08/09/2017  . Morbid obesity ([place]) (BMI 35 + associated diagnoses) 08/09/2017  . Intramural, submucous, and subserous leiomyoma of uterus 02/24/2017  . Depression, major, single episode, in partial remission ([place])   . Paranoid schizophrenia ([place]), followed by [facility] (Dr. Laban Emperor) 05/18/2016    Past Surgical History:  Procedure Laterality Date  . COLON SURGERY  2000    due to Chron's age 30.  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  Marland Kitchen MYOMECTOMY  2019     OB History    Gravida  0   Para  0   Term  0   Preterm  0   AB  0   Living  0     SAB  0   TAB  0   Ectopic  0   Multiple  0   Live Births  0            Home Medications    Prior to Admission medications     Medication Sig Start Date End Date Taking? Authorizing Provider  ARIPiprazole (ABILIFY) 15 MG tablet Take by mouth.   Yes [provider]  Multiple Vitamins-Minerals (MULTIVITAMIN PO) Take by mouth.   Yes [provider]  traZODone (DESYREL) 150 MG tablet  08/13/18  Yes [provider]  HYDROcodone-acetaminophen (NORCO/VICODIN) 5-325 MG tablet Take 1-2 tablets by mouth every 6 (six) hours as needed for severe pain. 09/28/18   Frederica Kuster, PA-C  hydrOXYzine (VISTARIL) 25 MG capsule  06/11/18   [provider]  ondansetron (ZOFRAN) 4 MG tablet Take 1 tablet (4 mg total) by mouth every 6 (six) hours. 09/28/18   Frederica Kuster, PA-C    Family History Family History  Problem Relation Age of Onset  . Hyperlipidemia Mother   . Hypertension Mother   . Crohn's disease Father   . Crohn's disease Brother   . Mental illness Maternal Grandmother   . Bone cancer Maternal Grandfather   . Mental illness Paternal Grandmother     Social History Social History   Tobacco Use  . Smoking status: Never Smoker  . Smokeless tobacco: Never Used  Substance Use Topics  . Alcohol use: No  . Drug use: No     Allergies   Patient has no known allergies.   Review of Systems Review of Systems  Constitutional: Negative for chills and fever.  HENT: Negative for facial swelling and sore throat.   Respiratory: Negative for shortness of breath.   Cardiovascular: Negative for chest pain.  Gastrointestinal: Positive for abdominal pain, blood in stool, diarrhea, nausea and vomiting.  Genitourinary: Negative for dysuria.  Musculoskeletal: Negative for back pain.  Skin: Negative for rash and wound.  Neurological: Negative for headaches.  Psychiatric/Behavioral: The patient is not nervous/anxious.      Physical Exam Updated Vital Signs BP 120/80   Pulse 83   Temp 98.7 F (37.1 C) (Oral)   Resp 18   LMP 09/13/2018   SpO2 99%   Physical Exam Vitals signs and  nursing note reviewed. Exam conducted with a chaperone present.  Constitutional:      General: She is not in acute distress.    Appearance: She is well-developed. She is not diaphoretic.  HENT:     Head: Normocephalic and atraumatic.     Mouth/Throat:     Pharynx: No oropharyngeal exudate.  Eyes:     General: No scleral icterus.       Right eye: No discharge.        Left eye: No discharge.     Conjunctiva/sclera: Conjunctivae normal.     Pupils: Pupils are equal, round, and reactive to light.  Neck:     Musculoskeletal: Normal range of motion and neck supple.     Thyroid: No thyromegaly.  Cardiovascular:     Rate and Rhythm: Normal rate and regular rhythm.     Heart sounds: Normal heart sounds. No murmur. No friction rub. No gallop.   Pulmonary:     Effort: Pulmonary effort is normal. No respiratory distress.     Breath sounds: Normal breath sounds. No stridor. No wheezing or rales.  Abdominal:     General: Bowel sounds are normal. There is no distension.     Palpations: Abdomen is soft.     Tenderness: There is no abdominal tenderness. There is no guarding or rebound.  Genitourinary:    Rectum: Guaiac result negative. External hemorrhoid present.  Lymphadenopathy:     Cervical: No cervical adenopathy.  Skin:    General: Skin is warm and dry.     Coloration: Skin is not pale.     Findings: No rash.  Neurological:     Mental Status: She is alert.     Coordination: Coordination normal.      ED Treatments / Results  Labs (all labs ordered are listed, but only abnormal results are displayed) Labs Reviewed  URINALYSIS, ROUTINE W REFLEX MICROSCOPIC - Abnormal; Notable for the following components:      Result Value   Specific Gravity, Urine >1.030 (*)    Hgb urine dipstick SMALL (*)    All other components within normal limits  CBC WITH DIFFERENTIAL/PLATELET - Abnormal; Notable for the following components:   RBC 5.32 (*)    HCT 46.2 (*)    RDW 15.6 (*)    All other  components within normal limits  COMPREHENSIVE METABOLIC PANEL - Abnormal; Notable for the following components:   Sodium 133 (*)    CO2 21 (*)    Glucose, Bld 115 (*)    All other components within normal limits  URINALYSIS, MICROSCOPIC (REFLEX) - Abnormal; Notable for the following  components:   Bacteria, UA FEW (*)    All other components within normal limits  PREGNANCY, URINE  LIPASE, BLOOD  OCCULT BLOOD X 1 CARD TO LAB, STOOL    EKG None  Radiology Ct Abdomen Pelvis W Contrast  Result Date: 09/28/2018 CLINICAL DATA:  38 year old female with abdominal pain, nausea and diarrhea today. Crohn disease. EXAM: CT ABDOMEN AND PELVIS WITH CONTRAST TECHNIQUE: Multidetector CT imaging of the abdomen and pelvis was performed using the standard protocol following bolus administration of intravenous contrast. CONTRAST:  165m ISOVUE-300 IOPAMIDOL (ISOVUE-300) INJECTION 61% COMPARISON:  CT Abdomen and Pelvis 09/05/2015. FINDINGS: Lower chest: Negative. Hepatobiliary: Negative. Pancreas: Negative. Spleen: Negative. Adrenals/Urinary Tract: Normal adrenal glands. Both kidneys appear stable and normal. No urinary calculi are evident. Normal proximal ureters. Diminutive and unremarkable urinary bladder. Stomach/Bowel: Oral contrast has reached the rectum without obstruction. Negative large bowel. Neo terminal ileum in the right lower quadrant with circumferential thickening of the distal small bowel (series 5, image 36 which is new or increased compared to 2016. No regional inflammatory stranding. About 4 centimeters of the neo TI are affected. Upstream from that small bowel loops appear fairly normal throughout the abdomen and pelvis. Negative stomach and duodenum. No free air, free fluid. Vascular/Lymphatic: Major arterial structures are patent and appear normal. Patent portal venous system. No lymphadenopathy. Reproductive: Bulky chronic pedunculated fibroid uterus, 12 centimeters x 14.4 centimeters x 15.2  centimeters (AP by transverse by CC). Regional mass effect including on the distal large and small bowel. Ovaries located along the lateral aspect of the enlarged uterus appear within normal limits. Other: No pelvic free fluid. Postoperative changes to the midline ventral abdomen. Musculoskeletal: No acute osseous abnormality identified. Incidental osteitis condensans ilii. IMPRESSION: 1. Circumferential thickening of the distal ~4 cm of small bowel at the neo-TI compatible with Crohn disease. This has progressed since 2016, but no acute inflammation or complicating features. 2. Bulky chronic Fibroid Uterus (15 cm diameter) with regional mass effect. Electronically Signed   By: HGenevie AnnM.D.   On: 09/28/2018 22:22    Procedures Procedures (including critical care time)  Medications Ordered in ED Medications  sodium chloride 0.9 % bolus 1,000 mL (0 mLs Intravenous Stopped 09/28/18 2100)  ondansetron (ZOFRAN) injection 4 mg (4 mg Intravenous Given 09/28/18 1947)  morphine 4 MG/ML injection 4 mg (4 mg Intravenous Given 09/28/18 1947)  iopamidol (ISOVUE-300) 61 % injection 100 mL (100 mLs Intravenous Contrast Given 09/28/18 2200)  metoCLOPramide (REGLAN) injection 10 mg (10 mg Intravenous Given 09/28/18 2151)     Initial Impression / Assessment and Plan / ED Course  I have reviewed the triage vital signs and the nursing notes.  Pertinent labs & imaging results that were available during my care of the patient were reviewed by me and considered in my medical decision making (see chart for details).     Patient presenting with nausea, vomiting, diarrhea, abdominal pain in the setting of Crohn's disease.  Patient reports she has not had a flare in a long time.  UA shows small hematuria, few bacteria.  Fecal occult is negative.  Otherwise, labs unremarkable.  CT abdomen pelvis shows circumferential thickening of the distal first centimeters of small bowel-UTI compatible with current disease has progressed  since 2016, but no acute inflammation or complicating features; bulky chronic fibroid uterus with regional mass-effect.  Patient is feeling better after morphine, Zofran, Reglan, IV fluids.  Patient was discharged home with short course of Norco and Zofran.  Considering no  active inflammation, will treat acute flare at this time.  Patient referred to her GI doctor as scheduled next 1 to 2 weeks.  Return precautions discussed.  Patient understands and agrees with plan.  Patient vital stable throughout ED course and discharged in satisfactory condition. I discussed patient case with [name] who guided the patient's management and agrees with plan.   Final Clinical Impressions(s) / ED Diagnoses   Final diagnoses:  Nausea vomiting and diarrhea  Lower abdominal pain    ED Discharge Orders         Ordered    HYDROcodone-acetaminophen (NORCO/VICODIN) 5-325 MG tablet  Every 6 hours PRN     09/28/18 2323    ondansetron (ZOFRAN) 4 MG tablet  Every 6 hours     09/28/18 2323           Frederica Kuster, PA-C 09/29/18 1522    Maudie Flakes, MD 09/29/18 1535

## 2018-10-04 NOTE — Progress Notes (Deleted)
Vicki Lee is a 38 y.o. female is here for follow up.  History of Present Illness:   I, *** ,acting as a scribe for [employer], DO, have documented all relevant documentation on the behalf of Vicki Deutscher, DO, as directed by  Vicki Deutscher, DO while in the presence of Vicki Deutscher, DO.  HPI:  Patient presenting with nausea, vomiting, diarrhea, abdominal pain in the setting of Crohn's disease.  Patient reports she has not had a flare in a long time.  UA shows small hematuria, few bacteria.  Fecal occult is negative.  Otherwise, labs unremarkable.  CT abdomen pelvis shows circumferential thickening of the distal first centimeters of small bowel-UTI compatible with current disease has progressed since 2016, but no acute inflammation or complicating features; bulky chronic fibroid uterus with regional mass-effect.  Patient is feeling better after morphine, Zofran, Reglan, IV fluids.  Patient was discharged home with short course of Norco and Zofran.  Considering no active inflammation, will treat acute flare at this time.  Patient referred to her GI doctor as scheduled next 1 to 2 weeks.  Return precautions discussed.  Patient understands and agrees with plan.  Patient vital stable throughout ED course and discharged in satisfactory condition. I discussed patient case with [name] who guided the patient's management and agrees with plan.   Health Maintenance Due  Topic Date Due  . INFLUENZA VACCINE  04/12/2018   No flowsheet data found. PMHx, SurgHx, SocialHx, FamHx, Medications, and Allergies were reviewed in the Visit Navigator and updated as appropriate.   Patient Active Problem List   Diagnosis Date Noted  . Weight gain 08/01/2018  . Anxiety, with prn Hydroxyzine 08/01/2018  . Iron deficiency anemia due to chronic blood loss 08/14/2017  . Crohn's disease ([place]) 08/09/2017  . Morbid obesity ([place]) (BMI 35 + associated diagnoses) 08/09/2017  . Intramural, submucous, and subserous  leiomyoma of uterus 02/24/2017  . Depression, major, single episode, in partial remission ([place])   . Paranoid schizophrenia ([place]), followed by [facility] (Dr. Laban Emperor) 05/18/2016   Social History   Tobacco Use  . Smoking status: Never Smoker  . Smokeless tobacco: Never Used  Substance Use Topics  . Alcohol use: No  . Drug use: No   Current Medications and Allergies:   Current Outpatient Medications:  .  ARIPiprazole (ABILIFY) 15 MG tablet, Take by mouth., Disp: , Rfl:  .  HYDROcodone-acetaminophen (NORCO/VICODIN) 5-325 MG tablet, Take 1-2 tablets by mouth every 6 (six) hours as needed for severe pain., Disp: 12 tablet, Rfl: 0 .  hydrOXYzine (VISTARIL) 25 MG capsule, , Disp: , Rfl:  .  Multiple Vitamins-Minerals (MULTIVITAMIN PO), Take by mouth., Disp: , Rfl:  .  ondansetron (ZOFRAN) 4 MG tablet, Take 1 tablet (4 mg total) by mouth every 6 (six) hours., Disp: 12 tablet, Rfl: 0 .  traZODone (DESYREL) 150 MG tablet, , Disp: , Rfl:   No Known Allergies Review of Systems   Pertinent items are noted in the HPI. Otherwise, a complete ROS is negative.  Vitals:  There were no vitals filed for this visit.   There is no height or weight on file to calculate BMI.  Physical Exam:   Physical Exam  Results for orders placed or performed during the hospital encounter of 09/28/18  Urinalysis, Routine w reflex microscopic  Result Value Ref Range   Color, Urine YELLOW YELLOW   APPearance CLEAR CLEAR   Specific Gravity, Urine >1.030 (H) 1.005 - 1.030   pH 5.5  5.0 - 8.0   Glucose, UA NEGATIVE NEGATIVE mg/dL   Hgb urine dipstick SMALL (A) NEGATIVE   Bilirubin Urine NEGATIVE NEGATIVE   Ketones, ur NEGATIVE NEGATIVE mg/dL   Protein, ur NEGATIVE NEGATIVE mg/dL   Nitrite NEGATIVE NEGATIVE   Leukocytes, UA NEGATIVE NEGATIVE  Pregnancy, urine  Result Value Ref Range   Preg Test, Ur NEGATIVE NEGATIVE  CBC with Differential  Result Value Ref Range   WBC 6.8 4.0 - 10.5 K/uL   RBC 5.32 (H)  3.87 - 5.11 MIL/uL   Hemoglobin 14.5 12.0 - 15.0 g/dL   HCT 46.2 (H) 36.0 - 46.0 %   MCV 86.8 80.0 - 100.0 fL   MCH 27.3 26.0 - 34.0 pg   MCHC 31.4 30.0 - 36.0 g/dL   RDW 15.6 (H) 11.5 - 15.5 %   Platelets 333 150 - 400 K/uL   nRBC 0.0 0.0 - 0.2 %   Neutrophils Relative % 83 %   Neutro Abs 5.6 1.7 - 7.7 K/uL   Lymphocytes Relative 11 %   Lymphs Abs 0.8 0.7 - 4.0 K/uL   Monocytes Relative 6 %   Monocytes Absolute 0.4 0.1 - 1.0 K/uL   Eosinophils Relative 0 %   Eosinophils Absolute 0.0 0.0 - 0.5 K/uL   Basophils Relative 0 %   Basophils Absolute 0.0 0.0 - 0.1 K/uL   Immature Granulocytes 0 %   Abs Immature Granulocytes 0.03 0.00 - 0.07 K/uL  Comprehensive metabolic panel  Result Value Ref Range   Sodium 133 (L) 135 - 145 mmol/L   Potassium 3.6 3.5 - 5.1 mmol/L   Chloride 104 98 - 111 mmol/L   CO2 21 (L) 22 - 32 mmol/L   Glucose, Bld 115 (H) 70 - 99 mg/dL   BUN 8 6 - 20 mg/dL   Creatinine, Ser 1.00 0.44 - 1.00 mg/dL   Calcium 9.4 8.9 - 10.3 mg/dL   Total Protein 7.9 6.5 - 8.1 g/dL   Albumin 3.8 3.5 - 5.0 g/dL   AST 16 15 - 41 U/L   ALT 19 0 - 44 U/L   Alkaline Phosphatase 79 38 - 126 U/L   Total Bilirubin 0.4 0.3 - 1.2 mg/dL   GFR calc non Af Amer >60 >60 mL/min   GFR calc Af Amer >60 >60 mL/min   Anion gap 8 5 - 15  Lipase, blood  Result Value Ref Range   Lipase 20 11 - 51 U/L  Occult blood card to lab, stool  Result Value Ref Range   Fecal Occult Bld NEGATIVE NEGATIVE  Urinalysis, Microscopic (reflex)  Result Value Ref Range   RBC / HPF 6-10 0 - 5 RBC/hpf   WBC, UA 0-5 0 - 5 WBC/hpf   Bacteria, UA FEW (A) NONE SEEN   Squamous Epithelial / LPF 6-10 0 - 5   Mucus PRESENT     Assessment and Plan:   Diagnoses and all orders for this visit:  Depression, major, single episode, in partial remission ([place])    . Orders and follow up as documented in [facility], reviewed diet, exercise and weight control, cardiovascular risk and specific lipid/LDL goals reviewed,  reviewed medications and side effects in detail.  . Reviewed expectations re: course of current medical issues. . Outlined signs and symptoms indicating need for more acute intervention. . Patient verbalized understanding and all questions were answered. . Patient received an After Visit Summary.  *** CMA served as Education administrator during this visit. History, Physical, and Plan performed by  medical provider. The above documentation has been reviewed and is accurate and complete. Vicki Lee, [place], [place], [facility] 10/04/2018

## 2018-10-05 ENCOUNTER — Ambulatory Visit: Payer: BLUE CROSS/BLUE SHIELD | Admitting: Family Medicine

## 2018-10-05 DIAGNOSIS — Z0289 Encounter for other administrative examinations: Secondary | ICD-10-CM

## 2018-10-08 ENCOUNTER — Encounter: Payer: Self-pay | Admitting: Family Medicine

## 2019-05-31 ENCOUNTER — Ambulatory Visit: Payer: BLUE CROSS/BLUE SHIELD | Admitting: Certified Nurse Midwife

## 2019-11-18 IMAGING — CT CT ABD-PELV W/ CM
2 of 4 series · 16 of 46 positions shown, 18 images · IV contrast (iopamidol)
Comparison: CT Abdomen and Pelvis 09/05/2015.

CLINICAL DATA: 38-year-old female with abdominal pain, nausea and
diarrhea today. Crohn disease.

EXAM:
CT ABDOMEN AND PELVIS WITH CONTRAST
TECHNIQUE: Multidetector CT imaging of the abdomen and pelvis was performed
using the standard protocol following bolus administration of
intravenous contrast.
CONTRAST:  100mL [id number] IOPAMIDOL (IEPI99-044) INJECTION 61%

[Series 2: axial st · axial · 0.73mm/px · z∈[-526,-111]mm · 13 of 91 slices shown, 15 images]
[im 4/91  soft-tissue]
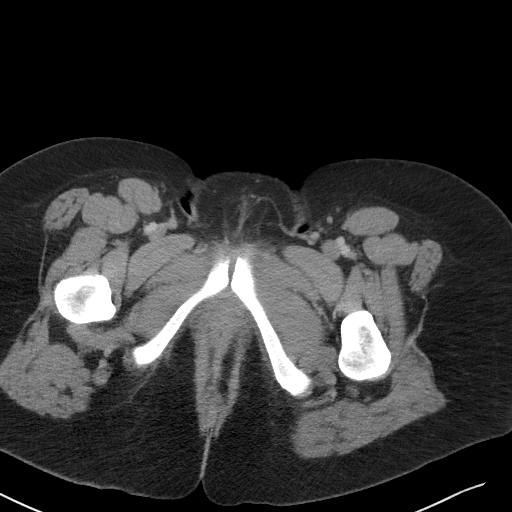
[im 4/91  bone]
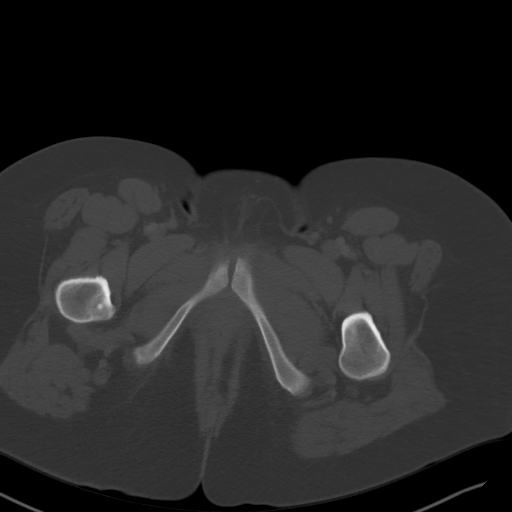
[im 11/91  soft-tissue]
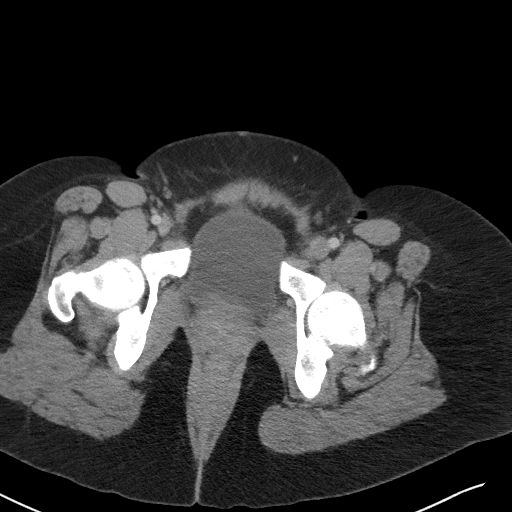
[im 19/91  soft-tissue]
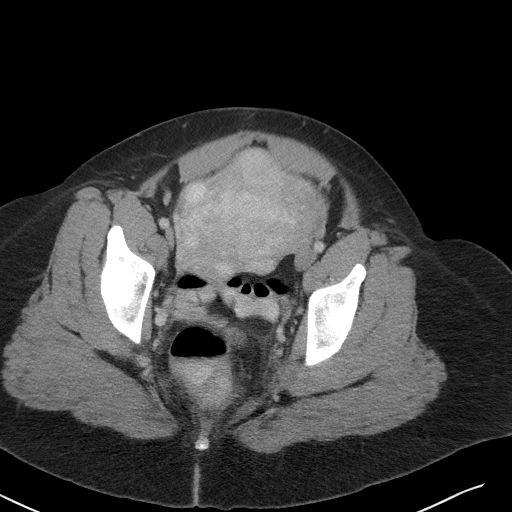
[im 26/91  soft-tissue]
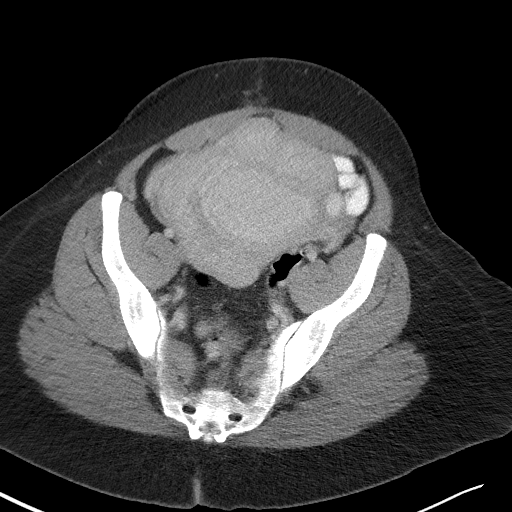
[im 33/91  soft-tissue]
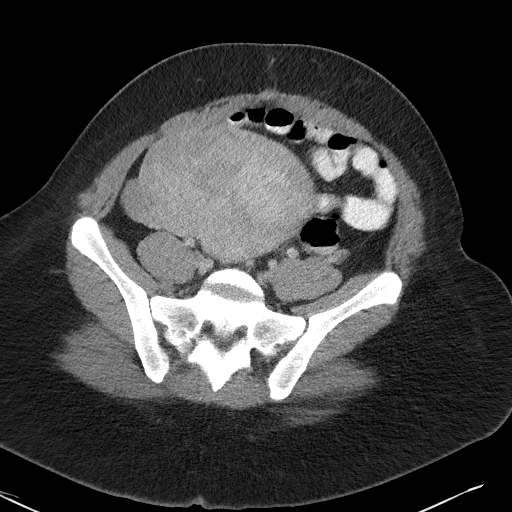
[im 40/91  soft-tissue]
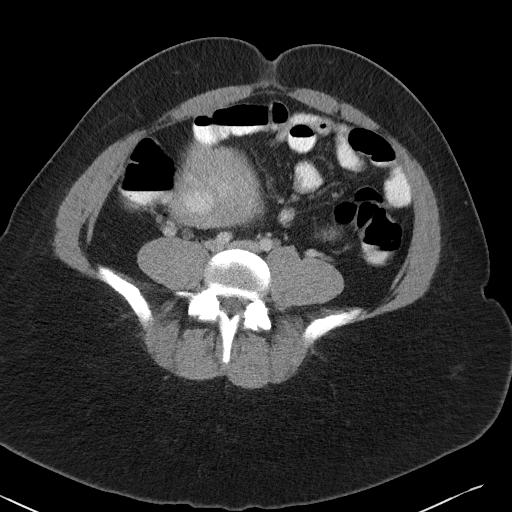
[im 47/91  soft-tissue]
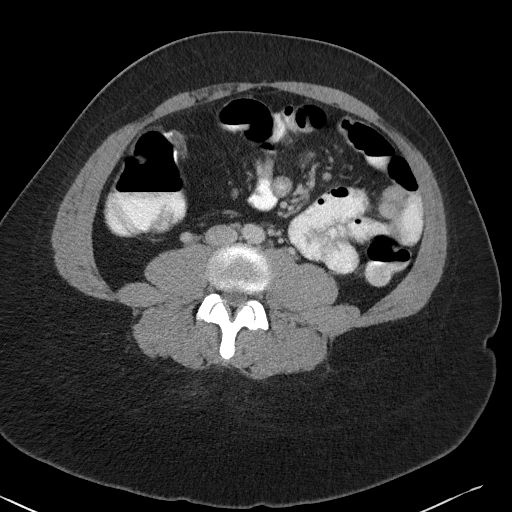
[im 51/91  soft-tissue]
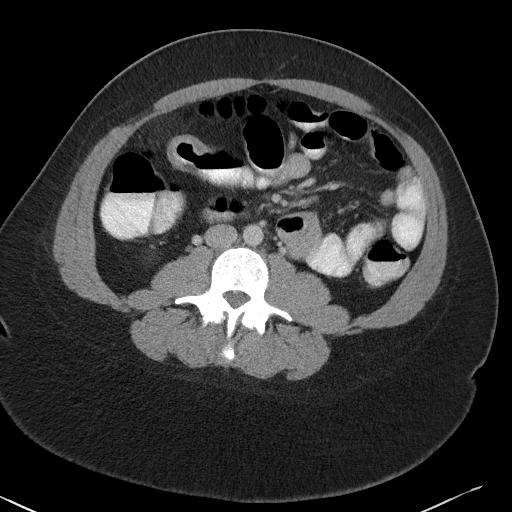
[im 58/91  soft-tissue]
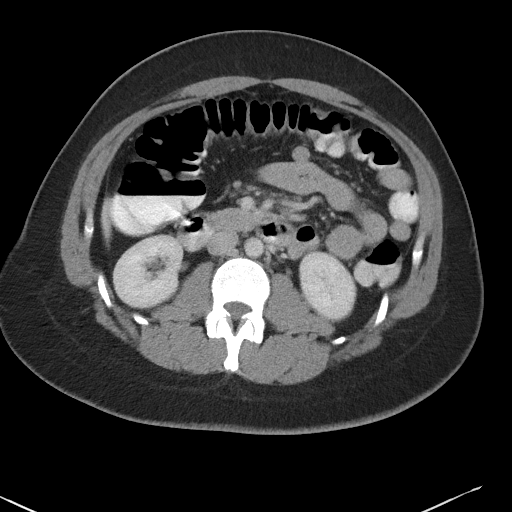
[im 58/91  bone]
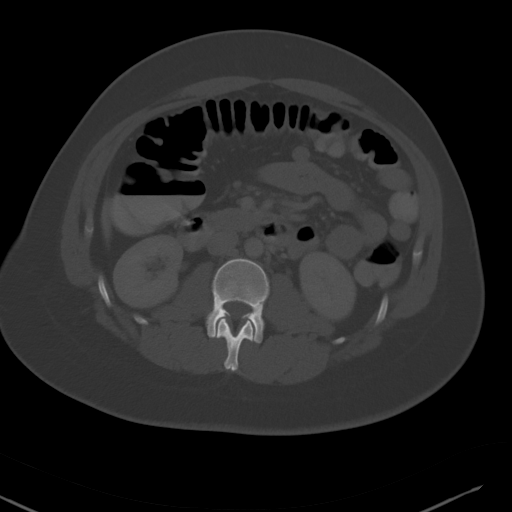
[im 65/91  soft-tissue]
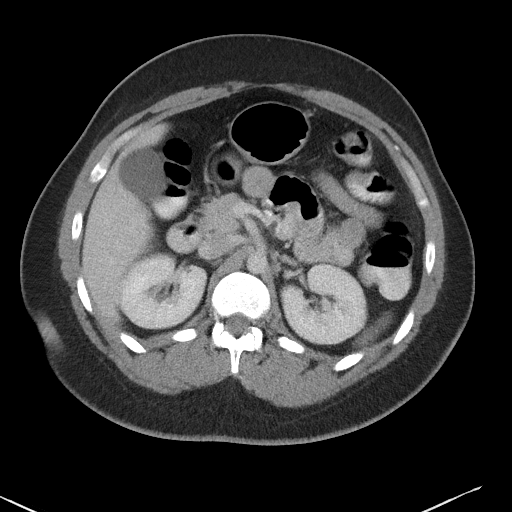
[im 73/91  soft-tissue]
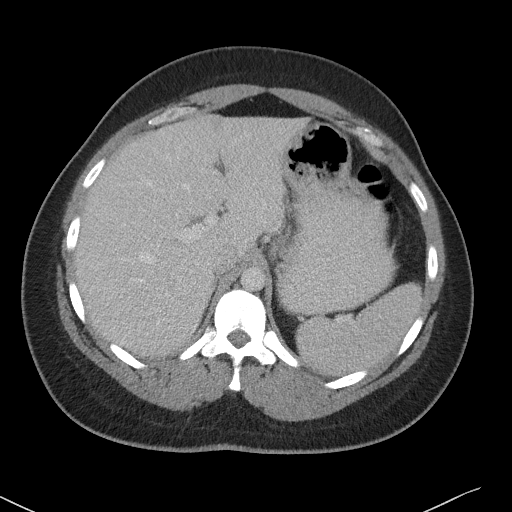
[im 80/91  soft-tissue]
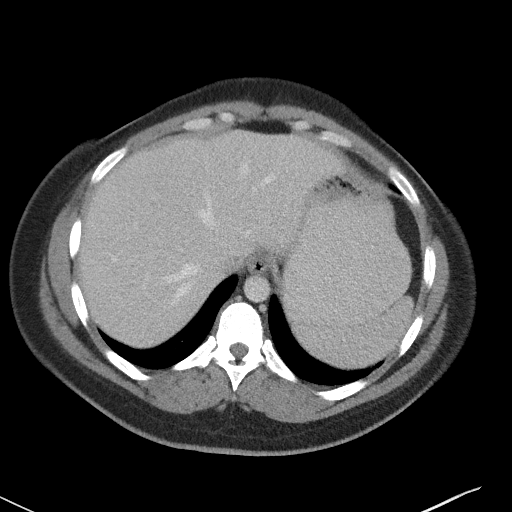
[im 87/91  soft-tissue]
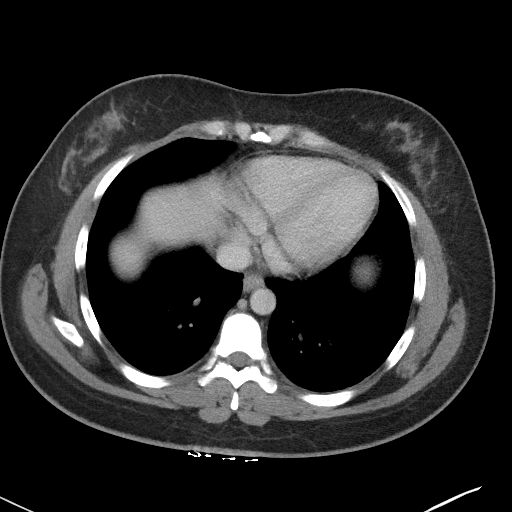

[Series 5: coronal st · coronal · 0.73mm/px · 3 of 105 slices shown]
[im 35/105  soft-tissue]
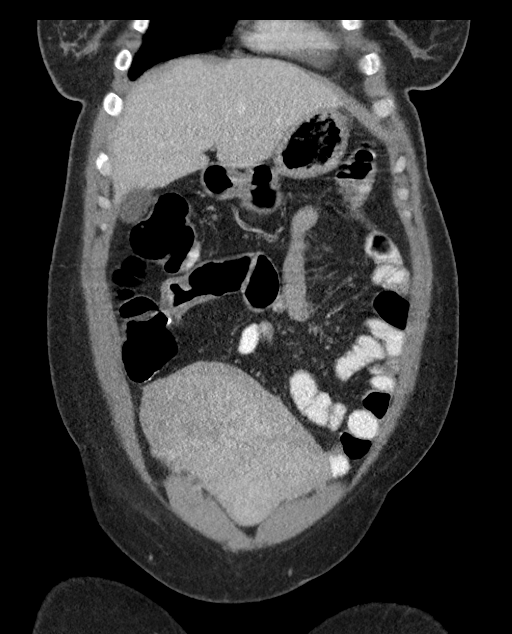
[im 47/105  soft-tissue]
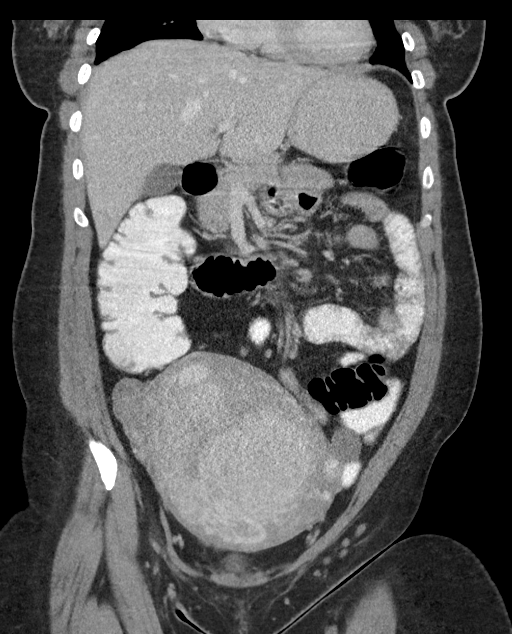
[im 58/105  soft-tissue]
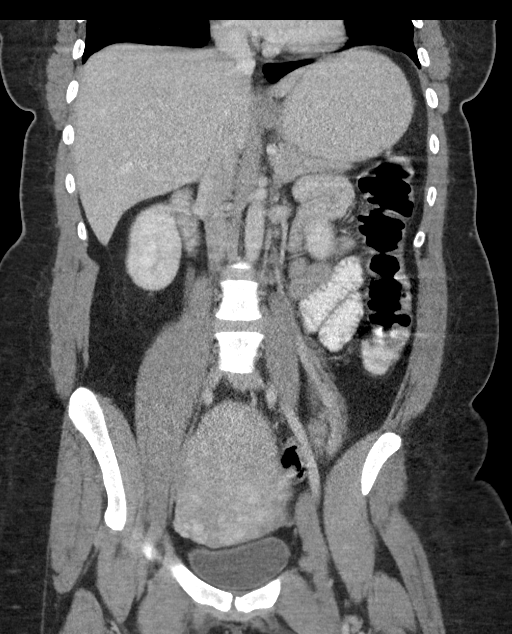

[16 of 46 positions shown; findings below may reference images not displayed]

FINDINGS: Lower chest: Negative.

Hepatobiliary: Negative.

Pancreas: Negative.

Spleen: Negative.

Adrenals/Urinary Tract: Normal adrenal glands. Both kidneys appear
stable and normal. No urinary calculi are evident. Normal proximal
ureters.

Diminutive and unremarkable urinary bladder.

Stomach/Bowel: Oral contrast has reached the rectum without
obstruction.

Negative large bowel. Neo terminal ileum in the right lower quadrant
with circumferential thickening of the distal small bowel (series 5,
image 36 which is new or increased compared to 3644. No regional
inflammatory stranding. About 4 centimeters of the neo TI are
affected. Upstream from that small bowel loops appear fairly normal
throughout the abdomen and pelvis. Negative stomach and duodenum.

No free air, free fluid.

Vascular/Lymphatic: Major arterial structures are patent and appear
normal. Patent portal venous system.

No lymphadenopathy.

Reproductive: Bulky chronic pedunculated fibroid uterus, 12
centimeters x 14.4 centimeters x 15.2 centimeters (AP by transverse
by CC). Regional mass effect including on the distal large and small
bowel. Ovaries located along the lateral aspect of the enlarged
uterus appear within normal limits.

Other: No pelvic free fluid. Postoperative changes to the midline
ventral abdomen.

Musculoskeletal: No acute osseous abnormality identified. Incidental
osteitis condensans ilii.
IMPRESSION: 1. Circumferential thickening of the distal ~4 cm of small bowel
at the neo-TI compatible with Crohn disease. This has progressed
since 3644, but no acute inflammation or complicating features.

2. Bulky chronic Fibroid Uterus (15 cm diameter) with regional mass
effect.

## 2019-11-29 ENCOUNTER — Encounter: Payer: Self-pay | Admitting: Certified Nurse Midwife

## 2020-02-20 ENCOUNTER — Ambulatory Visit (HOSPITAL_COMMUNITY): Payer: Self-pay | Admitting: Clinical

## 2020-03-03 ENCOUNTER — Telehealth (INDEPENDENT_AMBULATORY_CARE_PROVIDER_SITE_OTHER): Payer: No Payment, Other | Admitting: Psychiatry

## 2020-03-03 ENCOUNTER — Other Ambulatory Visit: Payer: Self-pay

## 2020-03-03 ENCOUNTER — Encounter (HOSPITAL_COMMUNITY): Payer: Self-pay | Admitting: Psychiatry

## 2020-03-03 DIAGNOSIS — F3341 Major depressive disorder, recurrent, in partial remission: Secondary | ICD-10-CM

## 2020-03-03 DIAGNOSIS — F200 Paranoid schizophrenia: Secondary | ICD-10-CM

## 2020-03-03 MED ORDER — CITALOPRAM HYDROBROMIDE 10 MG PO TABS: 10.0000 mg | ORAL_TABLET | Freq: Every day | ORAL | 1 refills | Status: DC

## 2020-03-03 MED ORDER — TRAZODONE HCL 150 MG PO TABS: 150.0000 mg | ORAL_TABLET | Freq: Every day | ORAL | 1 refills | Status: DC

## 2020-03-03 MED ORDER — OLANZAPINE 20 MG PO TABS: 20.0000 mg | ORAL_TABLET | Freq: Every day | ORAL | 1 refills | Status: DC

## 2020-03-03 MED ORDER — HYDROXYZINE PAMOATE 25 MG PO CAPS: 25.0000 mg | ORAL_CAPSULE | Freq: Three times a day (TID) | ORAL | 1 refills | Status: DC | PRN

## 2020-03-03 NOTE — Progress Notes (Signed)
Psychiatric Initial Adult Assessment  Virtual Visit via Video Note  I connected with [name] on 03/03/20 at 11:00 AM EDT by a video enabled telemedicine application and verified that I am speaking with the correct person using two identifiers.  Location: Patient: Home Provider: Clinic   I discussed the limitations of evaluation and management by telemedicine and the availability of in person appointments. The patient expressed understanding and agreed to proceed.  I provided 93mnutes of non-face-to-face time during this encounter.     Patient Identification: LRhealynn MyhreMRN:  [id number] of Evaluation:  03/03/2020 Referral Source: MBeverly SessionsChief Complaint:  " The medications help my anxiety and depression however I still hear voices" Visit Diagnosis:    ICD-10-CM   1. Paranoid schizophrenia ([place], [place] [facility] (Dr. TLaban Emperor  F20.0 OLANZapine (ZYPREXA) 20 MG tablet  2. Recurrent major depressive disorder, in partial remission ([facility])  F33.41 hydrOXYzine (VISTARIL) 25 MG capsule    traZODone (DESYREL) 150 MG tablet    citalopram (CELEXA) 10 MG tablet    History of Present Illness: 39year old female seen today for initial psychiatric evaluation. [facility] She has a psychiatric history of paranoid schizophrenia, depression, and anxiety. She is currently being managed on hydroxyzine 25 mg 3 times a day as needed, Zyprexa 15 mg at bedtime, Trazodone 150 mg at bedtime, and Celexa 10 mg daily. Patient reports that her medications are effective in treating symptoms of anxiety and depression however reports she still hears voices. She notes that her voices tell her what she is going to do prior to doing it. Today she denies SI/HI  She reports that her voices are bothersome and notes that she would like them to quiet down or disappear. She is agreeable to increase Zyprexa 162mto 20 mg at bedtime to  help manage hallucinations. She is also agreeable to continue all other medications as prescribed. No other concerns noted at this time.  Associated Signs/Symptoms: Depression Symptoms:  feelings of worthlessness/guilt, difficulty concentrating, anxiety, loss of energy/fatigue, increased appetite, (Hypo) Manic Symptoms:  Hallucinations, Irritable Mood, Anxiety Symptoms:  Excessive Worry, Psychotic Symptoms:  Hallucinations: Auditory PTSD Symptoms: NA  Past Psychiatric History: Anxiety, depression, and  Previous Psychotropic Medications: Yes   Substance Abuse History in the last 12 months:  No.  Consequences of Substance Abuse: NA  Past Medical History:  Past Medical History:  Diagnosis Date  . Anxiety   . Crohn disease ([place]  . Depression   . Fibroid   . Infertility, female     Past Surgical History:  Procedure Laterality Date  . COLON SURGERY  2000    due to Chron's age 39 . 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  . Marland KitchenYOMECTOMY  2019    Family Psychiatric History: Unknown  Family History:  Family History  Problem Relation Age of Onset  . Hyperlipidemia Mother   . Hypertension Mother   . Crohn's disease Father   . Crohn's disease Brother   . Mental illness Maternal Grandmother   . Bone cancer Maternal Grandfather   . Mental illness Paternal Grandmother     Social History:   Social History   Socioeconomic History  . Marital status: Married    Spouse name: Not on file  . Number of children: Not on file  . Years of education: Not on file  . Highest education level: Not on file  Occupational History  .  Not on file  Tobacco Use  . Smoking status: Never Smoker  . Smokeless tobacco: Never Used  Vaping Use  . Vaping Use: Never used  Substance and Sexual Activity  . Alcohol use: No  . Drug use: No  . Sexual activity: Yes    Partners: Male    Birth control/protection: None  Other Topics Concern  . Not on file  Social History Narrative  . Not on  file   Social Determinants of Health   Financial Resource Strain:   . Difficulty of Paying Living Expenses:   Food Insecurity:   . Worried About Charity fundraiser in the Last Year:   . Arboriculturist in the Last Year:   Transportation Needs:   . Film/video editor (Medical):   Marland Kitchen Lack of Transportation (Non-Medical):   Physical Activity:   . Days of Exercise per Week:   . Minutes of Exercise per Session:   Stress:   . Feeling of Stress :   Social Connections:   . Frequency of Communication with Friends and Family:   . Frequency of Social Gatherings with Friends and Family:   . Attends Religious Services:   . Active Member of Clubs or Organizations:   . Attends Archivist Meetings:   Marland Kitchen Marital Status:     Additional Social History: Patient resides in [place] with her husband. She has no children. She is currently unemployed. She denies tobacco, alcohol, or illicit drug use Allergies:  No Known Allergies  Metabolic Disorder Labs: Lab Results  Component Value Date   HGBA1C 5.4 08/01/2018   Lab Results  Component Value Date   PROLACTIN 171.3 (H) 05/19/2016   PROLACTIN 159.4 (H) 01/07/2016   Lab Results  Component Value Date   CHOL 196 05/29/2018   TRIG 177 (H) 05/29/2018   HDL 39 (L) 05/29/2018   CHOLHDL 5.0 (H) 05/29/2018   VLDL 19 05/19/2016   LDLCALC 122 (H) 05/29/2018   LDLCALC 129 (H) 05/19/2016   Lab Results  Component Value Date   TSH 2.384 05/19/2016    Therapeutic Level Labs: No results found for: LITHIUM No results found for: CBMZ No results found for: VALPROATE  Current Medications: Current Outpatient Medications  Medication Sig Dispense Refill  . citalopram (CELEXA) 10 MG tablet Take 1 tablet (10 mg total) by mouth daily. 30 tablet 1  . HYDROcodone-acetaminophen (NORCO/VICODIN) 5-325 MG tablet Take 1-2 tablets by mouth every 6 (six) hours as needed for severe pain. 12 tablet 0  . hydrOXYzine (VISTARIL) 25 MG capsule Take 1  capsule (25 mg total) by mouth every 8 (eight) hours as needed for anxiety. 90 capsule 1  . Multiple Vitamins-Minerals (MULTIVITAMIN PO) Take by mouth.    . OLANZapine (ZYPREXA) 20 MG tablet Take 1 tablet (20 mg total) by mouth at bedtime. 30 tablet 1  . ondansetron (ZOFRAN) 4 MG tablet Take 1 tablet (4 mg total) by mouth every 6 (six) hours. 12 tablet 0  . traZODone (DESYREL) 150 MG tablet Take 1 tablet (150 mg total) by mouth at bedtime. 30 tablet 1   No current facility-administered medications for this visit.    Musculoskeletal: Strength & Muscle Tone: Unable to assess due to telehealth visit [place]: Unable to assess due to telehealth visit Patient leans: N/A  Psychiatric Specialty Exam: Review of Systems  There were no vitals taken for this visit.There is no height or weight on file to calculate BMI.  General Appearance: Unable to assess  due to telehpone visit  Eye Contact:  Unable to assess due to telehpone visit  Speech:  Clear and Coherent and Normal Rate  Volume:  Normal  Mood:  Euthymic  Affect:  Congruent  Thought Process:  Coherent, Goal Directed and Linear  Orientation:  Full (Time, Place, and Person)  Thought Content:  WDL and Logical  Suicidal Thoughts:  No  Homicidal Thoughts:  No  Memory:  Immediate;   Good Recent;   Good Remote;   Good  Judgement:  Good  Insight:  Good  Psychomotor Activity:  Normal  Concentration:  Concentration: Good and Attention Span: Good  Recall:  Good  Fund of Knowledge:Good  Language: Good  Akathisia:  No  Handed:  Right  AIMS (if indicated):  Not done  Assets:  Communication Skills Desire for Improvement Housing Social Support  ADL's:  Intact  Cognition: WNL  Sleep:  Good   Screenings: AIMS     Admission (Discharged) from 01/06/2016 in [facility] Total Score 0    AUDIT     Admission (Discharged) from 05/18/2016 in [facility] Admission (Discharged) from  01/06/2016 in [facility]  Alcohol Use Disorder Identification Test Final Score (AUDIT) 0 0      Assessment and Plan: Patient reports that she has seen an improvement in her anxiety and depression.  She however notes that she continues to hear voices and would like medications adjusted.  She is agreeable to increasing olanzapine 15 mg to 20 mg at bedtime.   Salley Slaughter, NP 6/22/20212:58 PM

## 2020-04-03 ENCOUNTER — Encounter (HOSPITAL_COMMUNITY): Payer: Self-pay | Admitting: Psychiatry

## 2020-04-03 ENCOUNTER — Telehealth (INDEPENDENT_AMBULATORY_CARE_PROVIDER_SITE_OTHER): Payer: No Payment, Other | Admitting: Psychiatry

## 2020-04-03 ENCOUNTER — Other Ambulatory Visit: Payer: Self-pay

## 2020-04-03 DIAGNOSIS — F209 Schizophrenia, unspecified: Secondary | ICD-10-CM | POA: Insufficient documentation

## 2020-04-03 DIAGNOSIS — F3341 Major depressive disorder, recurrent, in partial remission: Secondary | ICD-10-CM

## 2020-04-03 DIAGNOSIS — F200 Paranoid schizophrenia: Secondary | ICD-10-CM | POA: Diagnosis not present

## 2020-04-03 MED ORDER — CITALOPRAM HYDROBROMIDE 10 MG PO TABS: 10.0000 mg | ORAL_TABLET | Freq: Every day | ORAL | 1 refills | Status: DC

## 2020-04-03 MED ORDER — TRAZODONE HCL 150 MG PO TABS: 150.0000 mg | ORAL_TABLET | Freq: Every day | ORAL | 1 refills | Status: DC

## 2020-04-03 MED ORDER — HYDROXYZINE PAMOATE 25 MG PO CAPS: 25.0000 mg | ORAL_CAPSULE | Freq: Three times a day (TID) | ORAL | 1 refills | Status: DC | PRN

## 2020-04-03 MED ORDER — BUSPIRONE HCL 10 MG PO TABS: 10.0000 mg | ORAL_TABLET | Freq: Three times a day (TID) | ORAL | 1 refills | Status: DC

## 2020-04-03 MED ORDER — OLANZAPINE 20 MG PO TABS: 20.0000 mg | ORAL_TABLET | Freq: Every day | ORAL | 1 refills | Status: DC

## 2020-04-03 MED ORDER — DIVALPROEX SODIUM 500 MG PO DR TAB: 500.0000 mg | DELAYED_RELEASE_TABLET | Freq: Every evening | ORAL | 1 refills | Status: DC

## 2020-04-03 NOTE — Progress Notes (Signed)
[facility] MD/PA/NP OP Progress Note Virtual Visit via Video Note  I connected with Vicki Lee on 04/03/20 at 11:30 AM EDT by a video enabled telemedicine application and verified that I am speaking with the correct person using two identifiers.  Location: Patient: Home Provider: Clinic   I discussed the limitations of evaluation and management by telemedicine and the availability of in person appointments. The patient expressed understanding and agreed to proceed.  I provided 30 minutes of non-face-to-face time during this encounter.      04/03/2020 12:01 PM Vicki Lee  MRN:  [id number]  Chief Complaint: "The voices are getting worse"  HPI: 39 year old female seen today for followup psychiatric evaluation. She has a psychiatric history of paranoid schizophrenia, depression, and anxiety. She is currently being managed on hydroxyzine 25 mg 3 times a day as needed, Zyprexa 20 mg at bedtime, Trazodone 150 mg at bedtime, and Celexa 10 mg daily. Today she notes that she is having increased anxiety and increasing auditory hallucinations. She notes that her voices tell her what she is going to do prior to doing it. She denies SI/HI or paranoia  Patient notes that she has occasional depressive symptoms. She reports that her anxiety has increased over the last few weeks. She notes that she avoids leaving her home in fear of having a panic attack in public. She notes that when she is anxious her auditory hallucinations worsen. She notes that they constantly nag her about doing things before she does then which she notes is bothersome.   She is agreeable starting Depakote 500 mg nightly to augment with Zyprexa 20 mg at bedtime to help manage hallucinations. She is also agreeable to start buspar 10 mg three times daily to help with symptoms of anxiety. She will continue all other medications as prescribed. No other concerns noted at this time. Visit Diagnosis:    ICD-10-CM   1. Schizophrenia,  unspecified type ([place])  F20.9 OLANZapine (ZYPREXA) 20 MG tablet    divalproex (DEPAKOTE) 500 MG DR tablet  2. Recurrent major depressive disorder, in partial remission (HCC)  F33.41 hydrOXYzine (VISTARIL) 25 MG capsule    traZODone (DESYREL) 150 MG tablet    citalopram (CELEXA) 10 MG tablet    busPIRone (BUSPAR) 10 MG tablet    Past Psychiatric History: Anxiety, depression, and schizophrenia Past Medical History:  Past Medical History:  Diagnosis Date  . Anxiety   . Crohn disease ([place])   . Depression   . Fibroid   . Infertility, female     Past Surgical History:  Procedure Laterality Date  . COLON SURGERY  2000    due to Chron's age 58.  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  Marland Kitchen MYOMECTOMY  2019    Family Psychiatric History: Unknown  Family History:  Family History  Problem Relation Age of Onset  . Hyperlipidemia Mother   . Hypertension Mother   . Crohn's disease Father   . Crohn's disease Brother   . Mental illness Maternal Grandmother   . Bone cancer Maternal Grandfather   . Mental illness Paternal Grandmother     Social History:  Social History   Socioeconomic History  . Marital status: Married    Spouse name: Not on file  . Number of children: Not on file  . Years of education: Not on file  . Highest education level: Not on file  Occupational History  . Not on file  Tobacco Use  . Smoking status: Never Smoker  .  Smokeless tobacco: Never Used  Vaping Use  . Vaping Use: Never used  Substance and Sexual Activity  . Alcohol use: No  . Drug use: No  . Sexual activity: Yes    Partners: Male    Birth control/protection: None  Other Topics Concern  . Not on file  Social History Narrative  . Not on file   Social Determinants of Health   Financial Resource Strain:   . Difficulty of Paying Living Expenses:   Food Insecurity:   . Worried About Charity fundraiser in the Last Year:   . Arboriculturist in the Last Year:   Transportation Needs:   .  Film/video editor (Medical):   Marland Kitchen Lack of Transportation (Non-Medical):   Physical Activity:   . Days of Exercise per Week:   . Minutes of Exercise per Session:   Stress:   . Feeling of Stress :   Social Connections:   . Frequency of Communication with Friends and Family:   . Frequency of Social Gatherings with Friends and Family:   . Attends Religious Services:   . Active Member of Clubs or Organizations:   . Attends Archivist Meetings:   Marland Kitchen Marital Status:     Allergies: No Known Allergies  Metabolic Disorder Labs: Lab Results  Component Value Date   HGBA1C 5.4 08/01/2018   Lab Results  Component Value Date   PROLACTIN 171.3 (H) 05/19/2016   PROLACTIN 159.4 (H) 01/07/2016   Lab Results  Component Value Date   CHOL 196 05/29/2018   TRIG 177 (H) 05/29/2018   HDL 39 (L) 05/29/2018   CHOLHDL 5.0 (H) 05/29/2018   VLDL 19 05/19/2016   LDLCALC 122 (H) 05/29/2018   LDLCALC 129 (H) 05/19/2016   Lab Results  Component Value Date   TSH 2.384 05/19/2016   TSH 2.461 01/07/2016    Therapeutic Level Labs: No results found for: LITHIUM No results found for: VALPROATE No components found for:  CBMZ  Current Medications: Current Outpatient Medications  Medication Sig Dispense Refill  . busPIRone (BUSPAR) 10 MG tablet Take 1 tablet (10 mg total) by mouth 3 (three) times daily. 90 tablet 1  . citalopram (CELEXA) 10 MG tablet Take 1 tablet (10 mg total) by mouth daily. 30 tablet 1  . divalproex (DEPAKOTE) 500 MG DR tablet Take 1 tablet (500 mg total) by mouth at bedtime. 30 tablet 1  . HYDROcodone-acetaminophen (NORCO/VICODIN) 5-325 MG tablet Take 1-2 tablets by mouth every 6 (six) hours as needed for severe pain. 12 tablet 0  . hydrOXYzine (VISTARIL) 25 MG capsule Take 1 capsule (25 mg total) by mouth every 8 (eight) hours as needed for anxiety. 90 capsule 1  . Multiple Vitamins-Minerals (MULTIVITAMIN PO) Take by mouth.    . OLANZapine (ZYPREXA) 20 MG tablet Take  1 tablet (20 mg total) by mouth at bedtime. 30 tablet 1  . ondansetron (ZOFRAN) 4 MG tablet Take 1 tablet (4 mg total) by mouth every 6 (six) hours. 12 tablet 0  . traZODone (DESYREL) 150 MG tablet Take 1 tablet (150 mg total) by mouth at bedtime. 30 tablet 1   No current facility-administered medications for this visit.     Musculoskeletal: Strength & Muscle Tone: Unable to assess due to telehealth visit [place]: Unable to assess due to telehealth visit Patient leans: N/A  Psychiatric Specialty Exam: Review of Systems  There were no vitals taken for this visit.There is no height or weight on file  to calculate BMI.  General Appearance: Well Groomed  Eye Contact:  Absent  Speech:  Clear and Coherent and Normal Rate  Volume:  Normal  Mood:  Anxious and Depressed  Affect:  Congruent  Thought Process:  Coherent, Goal Directed and Linear  Orientation:  Full (Time, Place, and Person)  Thought Content: Logical and Hallucinations: Auditory   Suicidal Thoughts:  No  Homicidal Thoughts:  No  Memory:  Immediate;   Good Recent;   Good Remote;   Good  Judgement:  Good  Insight:  Good  Psychomotor Activity:  Normal  Concentration:  Concentration: Good and Attention Span: Good  Recall:  Good  Fund of Knowledge: Good  Language: Good  Akathisia:  No  Handed:  Right  AIMS (if indicated): Not done  Assets:  Communication Skills Desire for Improvement Financial Resources/Insurance Housing  ADL's:  Intact  Cognition: WNL  Sleep:  Good   Screenings: AIMS     Admission (Discharged) from 01/06/2016 in [facility] Total Score 0    AUDIT     Admission (Discharged) from 05/18/2016 in [facility] Admission (Discharged) from 01/06/2016 in [facility]  Alcohol Use Disorder Identification Test Final Score (AUDIT) 0 0       Assessment and Plan: Patient endorses depression, anxiety, and auditory hallucinations.  She is agreeable starting Depakote 500 mg nightly to augment with Zyprexa 20 mg at bedtime to help manage hallucinations. She is also agreeable to start buspar 10 mg three times daily to help with symptoms of anxiety. She will continue all other medications as prescribed.  1. Recurrent major depressive disorder, in partial remission (HCC)  Continue- hydrOXYzine (VISTARIL) 25 MG capsule; Take 1 capsule (25 mg total) by mouth every 8 (eight) hours as needed for anxiety.  Dispense: 90 capsule; Refill: 1 Continue- traZODone (DESYREL) 150 MG tablet; Take 1 tablet (150 mg total) by mouth at bedtime.  Dispense: 30 tablet; Refill: 1 Continue- citalopram (CELEXA) 10 MG tablet; Take 1 tablet (10 mg total) by mouth daily.  Dispense: 30 tablet; Refill: 1 Start- busPIRone (BUSPAR) 10 MG tablet; Take 1 tablet (10 mg total) by mouth 3 (three) times daily.  Dispense: 90 tablet; Refill: 1  2. Schizophrenia, unspecified type ([place])  Continue- OLANZapine (ZYPREXA) 20 MG tablet; Take 1 tablet (20 mg total) by mouth at bedtime.  Dispense: 30 tablet; Refill: 1 Start- divalproex (DEPAKOTE) 500 MG DR tablet; Take 1 tablet (500 mg total) by mouth at bedtime.  Dispense: 30 tablet; Refill: 1  Follow-up in one month    Salley Slaughter, NP 04/03/2020, 12:01 PM

## 2020-04-06 ENCOUNTER — Telehealth (HOSPITAL_COMMUNITY): Payer: Self-pay | Admitting: Psychiatry

## 2020-05-11 ENCOUNTER — Telehealth (HOSPITAL_COMMUNITY): Payer: No Payment, Other | Admitting: Psychiatry

## 2020-06-16 ENCOUNTER — Telehealth (INDEPENDENT_AMBULATORY_CARE_PROVIDER_SITE_OTHER): Payer: No Payment, Other | Admitting: Psychiatry

## 2020-06-16 ENCOUNTER — Other Ambulatory Visit: Payer: Self-pay

## 2020-06-16 ENCOUNTER — Encounter (HOSPITAL_COMMUNITY): Payer: Self-pay | Admitting: Psychiatry

## 2020-06-16 DIAGNOSIS — F3341 Major depressive disorder, recurrent, in partial remission: Secondary | ICD-10-CM

## 2020-06-16 DIAGNOSIS — F209 Schizophrenia, unspecified: Secondary | ICD-10-CM | POA: Diagnosis not present

## 2020-06-16 MED ORDER — DIVALPROEX SODIUM 250 MG PO DR TAB: 750.0000 mg | DELAYED_RELEASE_TABLET | Freq: Every evening | ORAL | 2 refills | Status: DC

## 2020-06-16 MED ORDER — HYDROXYZINE PAMOATE 25 MG PO CAPS: 25.0000 mg | ORAL_CAPSULE | Freq: Three times a day (TID) | ORAL | 2 refills | Status: DC | PRN

## 2020-06-16 MED ORDER — CITALOPRAM HYDROBROMIDE 10 MG PO TABS: 10.0000 mg | ORAL_TABLET | Freq: Every day | ORAL | 2 refills | Status: DC

## 2020-06-16 MED ORDER — OLANZAPINE 20 MG PO TABS: 20.0000 mg | ORAL_TABLET | Freq: Every day | ORAL | 2 refills | Status: DC

## 2020-06-16 MED ORDER — TRAZODONE HCL 100 MG PO TABS: 200.0000 mg | ORAL_TABLET | Freq: Every day | ORAL | 2 refills | Status: DC

## 2020-06-16 MED ORDER — BUSPIRONE HCL 15 MG PO TABS: 15.0000 mg | ORAL_TABLET | Freq: Three times a day (TID) | ORAL | 2 refills | Status: DC

## 2020-06-16 NOTE — Progress Notes (Signed)
[facility] MD/PA/NP OP Progress Note Virtual Visit via Telephone Note  I connected with Vicki Lee on 06/16/20 at  4:30 PM EDT by telephone and verified that I am speaking with the correct person using two identifiers.  Location: Patient: home Provider: Clinic   I discussed the limitations, risks, security and privacy concerns of performing an evaluation and management service by telephone and the availability of in person appointments. I also discussed with the patient that there may be a patient responsible charge related to this service. The patient expressed understanding and agreed to proceed.   I provided 30 minutes of non-face-to-face time during this encounter.        06/16/2020 5:24 PM Vicki Lee  MRN:  [id number]  Chief Complaint: "The voices are getting worse and i'm not sleeping"  HPI: 39 year old female seen today for followup psychiatric evaluation. She has a psychiatric history of paranoid schizophrenia, depression, and anxiety. She is currently being managed on hydroxyzine 25 mg 3 times a day as needed, Zyprexa 20 mg at bedtime, Trazodone 150 mg at bedtime, Buspar 10 mg three times daily, and Celexa 10 mg daily. Today she notes that she is having increased auditory hallucinations and poor sleep. She notes that her voices tell her what she is going to do prior to doing it.   On exam patient is pleasant, calm, cooperative, and engaged in conversation.  She notes that she has occasional anxiety and depression however reports her hallucinations and poor sleep are her major concerns.  She informed provider that she sleeps approximately 4 hours a night.  She also notes that her hallucinations nag her about doing things before she does then which she notes is bothersome.  She also endorses occasional paranoia.  She denies SI/HI/AVH.  She is agreeable increase Depakote 500 mg nightly to 750 mg nightly.  She will increase BuSpar 10 mg 3 times daily to BuSpar 15 mg 3 times daily  to help manage anxiety.  She is also agreeable to increasing trazodone 150 mg to 200 mg to help manage sleep.  She will continue all other medications as prescribed.  Potential side effects of medication and risks vs benefits of treatment vs non-treatment were explained and discussed. All questions were answered. No other concerns noted at this time.  Visit Diagnosis:    ICD-10-CM   1. Recurrent major depressive disorder, in partial remission (HCC)  F33.41 busPIRone (BUSPAR) 15 MG tablet    citalopram (CELEXA) 10 MG tablet    hydrOXYzine (VISTARIL) 25 MG capsule    traZODone (DESYREL) 100 MG tablet  2. Schizophrenia, unspecified type ([place])  F20.9 divalproex (DEPAKOTE) 250 MG DR tablet    OLANZapine (ZYPREXA) 20 MG tablet    Past Psychiatric History: Anxiety, depression, and schizophrenia Past Medical History:  Past Medical History:  Diagnosis Date  . Anxiety   . Crohn disease ([place])   . Depression   . Fibroid   . Infertility, female     Past Surgical History:  Procedure Laterality Date  . COLON SURGERY  2000    due to Chron's age 58.  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  Marland Kitchen MYOMECTOMY  2019    Family Psychiatric History: Unknown  Family History:  Family History  Problem Relation Age of Onset  . Hyperlipidemia Mother   . Hypertension Mother   . Crohn's disease Father   . Crohn's disease Brother   . Mental illness Maternal Grandmother   . Bone cancer Maternal  Grandfather   . Mental illness Paternal Grandmother     Social History:  Social History   Socioeconomic History  . Marital status: Married    Spouse name: Not on file  . Number of children: Not on file  . Years of education: Not on file  . Highest education level: Not on file  Occupational History  . Not on file  Tobacco Use  . Smoking status: Never Smoker  . Smokeless tobacco: Never Used  Vaping Use  . Vaping Use: Never used  Substance and Sexual Activity  . Alcohol use: No  . Drug use: No  .  Sexual activity: Yes    Partners: Male    Birth control/protection: None  Other Topics Concern  . Not on file  Social History Narrative  . Not on file   Social Determinants of Health   Financial Resource Strain:   . Difficulty of Paying Living Expenses: Not on file  Food Insecurity:   . Worried About Charity fundraiser in the Last Year: Not on file  . Ran Out of Food in the Last Year: Not on file  Transportation Needs:   . Lack of Transportation (Medical): Not on file  . Lack of Transportation (Non-Medical): Not on file  Physical Activity:   . Days of Exercise per Week: Not on file  . Minutes of Exercise per Session: Not on file  Stress:   . Feeling of Stress : Not on file  Social Connections:   . Frequency of Communication with Friends and Family: Not on file  . Frequency of Social Gatherings with Friends and Family: Not on file  . Attends Religious Services: Not on file  . Active Member of Clubs or Organizations: Not on file  . Attends Archivist Meetings: Not on file  . Marital Status: Not on file    Allergies: No Known Allergies  Metabolic Disorder Labs: Lab Results  Component Value Date   HGBA1C 5.4 08/01/2018   Lab Results  Component Value Date   PROLACTIN 171.3 (H) 05/19/2016   PROLACTIN 159.4 (H) 01/07/2016   Lab Results  Component Value Date   CHOL 196 05/29/2018   TRIG 177 (H) 05/29/2018   HDL 39 (L) 05/29/2018   CHOLHDL 5.0 (H) 05/29/2018   VLDL 19 05/19/2016   LDLCALC 122 (H) 05/29/2018   LDLCALC 129 (H) 05/19/2016   Lab Results  Component Value Date   TSH 2.384 05/19/2016   TSH 2.461 01/07/2016    Therapeutic Level Labs: No results found for: LITHIUM No results found for: VALPROATE No components found for:  CBMZ  Current Medications: Current Outpatient Medications  Medication Sig Dispense Refill  . busPIRone (BUSPAR) 15 MG tablet Take 1 tablet (15 mg total) by mouth 3 (three) times daily. 90 tablet 2  . citalopram (CELEXA)  10 MG tablet Take 1 tablet (10 mg total) by mouth daily. 30 tablet 2  . divalproex (DEPAKOTE) 250 MG DR tablet Take 3 tablets (750 mg total) by mouth at bedtime. 90 tablet 2  . HYDROcodone-acetaminophen (NORCO/VICODIN) 5-325 MG tablet Take 1-2 tablets by mouth every 6 (six) hours as needed for severe pain. 12 tablet 0  . hydrOXYzine (VISTARIL) 25 MG capsule Take 1 capsule (25 mg total) by mouth every 8 (eight) hours as needed for anxiety. 90 capsule 2  . Multiple Vitamins-Minerals (MULTIVITAMIN PO) Take by mouth.    . OLANZapine (ZYPREXA) 20 MG tablet Take 1 tablet (20 mg total) by mouth at bedtime. [place]  tablet 2  . ondansetron (ZOFRAN) 4 MG tablet Take 1 tablet (4 mg total) by mouth every 6 (six) hours. 12 tablet 0  . traZODone (DESYREL) 100 MG tablet Take 2 tablets (200 mg total) by mouth at bedtime. 60 tablet 2   No current facility-administered medications for this visit.     Musculoskeletal: Strength & Muscle Tone: Unable to assess due to telephone visit. Patient unable to log in virtually Gait & Station: Unable to assess due to telephone visit. Patient unable to log in virtually Patient leans: N/A  Psychiatric Specialty Exam: Review of Systems  There were no vitals taken for this visit.There is no height or weight on file to calculate BMI.  General Appearance: Unable to assess due to telephone visit. Patient unable to log in virtually  Eye Contact:  Unable to assess due to telephone visit. Patient unable to log in virtually  Speech:  Clear and Coherent and Normal Rate  Volume:  Normal  Mood:  Euthymic  Affect:  Congruent  Thought Process:  Coherent, Goal Directed and Linear  Orientation:  Full (Time, Place, and Person)  Thought Content: Logical and Hallucinations: Auditory   Suicidal Thoughts:  No  Homicidal Thoughts:  No  Memory:  Immediate;   Good Recent;   Good Remote;   Good  Judgement:  Good  Insight:  Good  Psychomotor Activity:  Normal  Concentration:  Concentration:  Good and Attention Span: Good  Recall:  Good  Fund of Knowledge: Good  Language: Good  Akathisia:  No  Handed:  Right  AIMS (if indicated): Not done  Assets:  Communication Skills Desire for Improvement Financial Resources/Insurance Housing  ADL's:  Intact  Cognition: WNL  Sleep:  Good   Screenings: AIMS     Admission (Discharged) from 01/06/2016 in [facility] Total Score 0    AUDIT     Admission (Discharged) from 05/18/2016 in [facility] Admission (Discharged) from 01/06/2016 in [facility]  Alcohol Use Disorder Identification Test Final Score (AUDIT) 0 0       Assessment and Plan: Patient endorses symptoms of insomnia and auditory hallucinations. She is agreeable increasing Depakote 500 mg to 750 mg nightly to help manage hallucinations. She is also agreeable to increase buspar 10 mg three times daily to 15 mg three times daily to help with symptoms of anxiety.  Patient agreeable to increasing trazodone 150 mg to 200 mg to help manage sleep.  She will continue all other medications as prescribed.  1. Recurrent major depressive disorder, in partial remission (HCC)  Increased- busPIRone (BUSPAR) 15 MG tablet; Take 1 tablet (15 mg total) by mouth 3 (three) times daily.  Dispense: 90 tablet; Refill: 2 Continue- citalopram (CELEXA) 10 MG tablet; Take 1 tablet (10 mg total) by mouth daily.  Dispense: 30 tablet; Refill: 2 Continue- hydrOXYzine (VISTARIL) 25 MG capsule; Take 1 capsule (25 mg total) by mouth every 8 (eight) hours as needed for anxiety.  Dispense: 90 capsule; Refill: 2 Increased- traZODone (DESYREL) 100 MG tablet; Take 2 tablets (200 mg total) by mouth at bedtime.  Dispense: 60 tablet; Refill: 2  2. Schizophrenia, unspecified type ([place])  Increased- divalproex (DEPAKOTE) 250 MG DR tablet; Take 3 tablets (750 mg total) by mouth at bedtime.  Dispense: 90 tablet; Refill: 2 Continue- OLANZapine  (ZYPREXA) 20 MG tablet; Take 1 tablet (20 mg total) by mouth at bedtime.  Dispense: 30 tablet; Refill: 2    Follow-up in one month  Salley Slaughter, NP 06/16/2020, 5:24 PM

## 2020-07-14 ENCOUNTER — Other Ambulatory Visit: Payer: Self-pay

## 2020-07-14 ENCOUNTER — Encounter (HOSPITAL_COMMUNITY): Payer: Self-pay | Admitting: Psychiatry

## 2020-07-14 ENCOUNTER — Telehealth (INDEPENDENT_AMBULATORY_CARE_PROVIDER_SITE_OTHER): Payer: No Payment, Other | Admitting: Psychiatry

## 2020-07-14 DIAGNOSIS — F3341 Major depressive disorder, recurrent, in partial remission: Secondary | ICD-10-CM

## 2020-07-14 DIAGNOSIS — F209 Schizophrenia, unspecified: Secondary | ICD-10-CM

## 2020-07-14 MED ORDER — OLANZAPINE 20 MG PO TABS: 20.0000 mg | ORAL_TABLET | Freq: Every day | ORAL | 2 refills | Status: DC

## 2020-07-14 MED ORDER — BUSPIRONE HCL 15 MG PO TABS: 15.0000 mg | ORAL_TABLET | Freq: Three times a day (TID) | ORAL | 2 refills | Status: DC

## 2020-07-14 MED ORDER — DIVALPROEX SODIUM 500 MG PO DR TAB: 1000.0000 mg | DELAYED_RELEASE_TABLET | Freq: Every evening | ORAL | 2 refills | Status: DC

## 2020-07-14 MED ORDER — PERPHENAZINE 4 MG PO TABS: 4.0000 mg | ORAL_TABLET | Freq: Every day | ORAL | 2 refills | Status: DC

## 2020-07-14 MED ORDER — CITALOPRAM HYDROBROMIDE 10 MG PO TABS: 10.0000 mg | ORAL_TABLET | Freq: Every day | ORAL | 2 refills | Status: DC

## 2020-07-14 MED ORDER — HYDROXYZINE PAMOATE 25 MG PO CAPS: 25.0000 mg | ORAL_CAPSULE | Freq: Three times a day (TID) | ORAL | 2 refills | Status: DC | PRN

## 2020-07-14 MED ORDER — TRAZODONE HCL 100 MG PO TABS: 200.0000 mg | ORAL_TABLET | Freq: Every day | ORAL | 2 refills | Status: DC

## 2020-07-14 NOTE — Progress Notes (Signed)
[place] MD/PA/NP OP Progress Note Virtual Visit via Telephone Note  I connected with Vicki [facility] on 07/14/20 at  3:30 PM EDT by telephone and verified that I am speaking with the correct person using two identifiers.  Location: Patient: home Provider: Clinic   I discussed the limitations, risks, security and privacy concerns of performing an evaluation and management service by telephone and the availability of in person appointments. I also discussed with the patient that there may be a patient responsible charge related to this service. The patient expressed understanding and agreed to proceed.   I provided 30 minutes of non-face-to-face time during this encounter.        07/14/2020 4:15 PM Vicki Lee  MRN:  [id number]  Chief Complaint: "My hallucinations are getting worse."  HPI: 39 year-old female seen today for follow up psychiatric evaluation. Patient has a psychiatric history of schizophrenia, depression, and anxiety. She is currently managed on Depakote 750 mg nightly, Buspar 15 mg TID, Trazodone 200 mg nightly, Zyprexa 20 mg nightly, Hydroxyzine 25 mg TID, and Celexa 10 mg daily. Today patient notes that medications are not effective in managing her symptoms.   Today, the patient is pleasant, cooperative, and engaged in conversation. Patient describes mood as depressed and anxious. A PHQ-9 was conducted and patient scored 19. A GAD-7 was conducted and patient scored 14. Patient endorsed symptoms of depression such as anhedonia, fatigue, worthlessness, hopelessness, insomnia, increased appetite, and poor concentration. She also endorsed symptoms of anxiety such as feeling on edge, nervousness, excessive worry, and restlessness. She reports worsening hallucinations, hearing voices telling her to do things; this is worse at night. At times, the patient endorses irritability. Patient denies SI/HI/paranoia.  The patient is agreeable to starting perphenazine 4 mg nightly to help  manage symptoms of schizophrenia and hallucinations. Patient is agreeable to increasing depakote to 1000 mg nightly to help manage symptoms of schizophrenia. She will have her Valproic Acid level and a CBC drawn. She will to continuing all other medications as prescribed.No other concerns noted at this time. Visit Diagnosis:    ICD-10-CM   1. Recurrent major depressive disorder, in partial remission (HCC)  F33.41 CBC w/Diff/Platelet    Valproic Acid level    traZODone (DESYREL) 100 MG tablet    hydrOXYzine (VISTARIL) 25 MG capsule    busPIRone (BUSPAR) 15 MG tablet    citalopram (CELEXA) 10 MG tablet  2. Schizophrenia, unspecified type ([place])  F20.9 CBC w/Diff/Platelet    Valproic Acid level    OLANZapine (ZYPREXA) 20 MG tablet    divalproex (DEPAKOTE) 500 MG DR tablet    Past Psychiatric History: Anxiety, depression, and schizophrenia Past Medical History:  Past Medical History:  Diagnosis Date  . Anxiety   . Crohn disease ([place])   . Depression   . Fibroid   . Infertility, female     Past Surgical History:  Procedure Laterality Date  . COLON SURGERY  2000    due to Chron's age 39.  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  Marland Kitchen MYOMECTOMY  2019    Family Psychiatric History: Unknown  Family History:  Family History  Problem Relation Age of Onset  . Hyperlipidemia Mother   . Hypertension Mother   . Crohn's disease Father   . Crohn's disease Brother   . Mental illness Maternal Grandmother   . Bone cancer Maternal Grandfather   . Mental illness Paternal Grandmother     Social History:  Social History  Socioeconomic History  . Marital status: Married    Spouse name: Not on file  . Number of children: Not on file  . Years of education: Not on file  . Highest education level: Not on file  Occupational History  . Not on file  Tobacco Use  . Smoking status: Never Smoker  . Smokeless tobacco: Never Used  Vaping Use  . Vaping Use: Never used  Substance and Sexual  Activity  . Alcohol use: No  . Drug use: No  . Sexual activity: Yes    Partners: Male    Birth control/protection: None  Other Topics Concern  . Not on file  Social History Narrative  . Not on file   Social Determinants of Health   Financial Resource Strain:   . Difficulty of Paying Living Expenses: Not on file  Food Insecurity:   . Worried About Charity fundraiser in the Last Year: Not on file  . Ran Out of Food in the Last Year: Not on file  Transportation Needs:   . Lack of Transportation (Medical): Not on file  . Lack of Transportation (Non-Medical): Not on file  Physical Activity:   . Days of Exercise per Week: Not on file  . Minutes of Exercise per Session: Not on file  Stress:   . Feeling of Stress : Not on file  Social Connections:   . Frequency of Communication with Friends and Family: Not on file  . Frequency of Social Gatherings with Friends and Family: Not on file  . Attends Religious Services: Not on file  . Active Member of Clubs or Organizations: Not on file  . Attends Archivist Meetings: Not on file  . Marital Status: Not on file    Allergies: No Known Allergies  Metabolic Disorder Labs: Lab Results  Component Value Date   HGBA1C 5.4 08/01/2018   Lab Results  Component Value Date   PROLACTIN 171.3 (H) 05/19/2016   PROLACTIN 159.4 (H) 01/07/2016   Lab Results  Component Value Date   CHOL 196 05/29/2018   TRIG 177 (H) 05/29/2018   HDL 39 (L) 05/29/2018   CHOLHDL 5.0 (H) 05/29/2018   VLDL 19 05/19/2016   LDLCALC 122 (H) 05/29/2018   LDLCALC 129 (H) 05/19/2016   Lab Results  Component Value Date   TSH 2.384 05/19/2016   TSH 2.461 01/07/2016    Therapeutic Level Labs: No results found for: LITHIUM No results found for: VALPROATE No components found for:  CBMZ  Current Medications: Current Outpatient Medications  Medication Sig Dispense Refill  . busPIRone (BUSPAR) 15 MG tablet Take 1 tablet (15 mg total) by mouth 3 (three)  times daily. 90 tablet 2  . citalopram (CELEXA) 10 MG tablet Take 1 tablet (10 mg total) by mouth daily. 30 tablet 2  . divalproex (DEPAKOTE) 500 MG DR tablet Take 2 tablets (1,000 mg total) by mouth at bedtime. 60 tablet 2  . HYDROcodone-acetaminophen (NORCO/VICODIN) 5-325 MG tablet Take 1-2 tablets by mouth every 6 (six) hours as needed for severe pain. 12 tablet 0  . hydrOXYzine (VISTARIL) 25 MG capsule Take 1 capsule (25 mg total) by mouth every 8 (eight) hours as needed for anxiety. 90 capsule 2  . Multiple Vitamins-Minerals (MULTIVITAMIN PO) Take by mouth.    . OLANZapine (ZYPREXA) 20 MG tablet Take 1 tablet (20 mg total) by mouth at bedtime. 30 tablet 2  . ondansetron (ZOFRAN) 4 MG tablet Take 1 tablet (4 mg total) by mouth every 6 (  six) hours. 12 tablet 0  . perphenazine (TRILAFON) 4 MG tablet Take 1 tablet (4 mg total) by mouth at bedtime. 30 tablet 2  . traZODone (DESYREL) 100 MG tablet Take 2 tablets (200 mg total) by mouth at bedtime. 60 tablet 2   No current facility-administered medications for this visit.     Musculoskeletal: Strength & Muscle Tone: Unable to assess due to telephone visit. Patient unable to log in virtually Gait & Station: Unable to assess due to telephone visit. Patient unable to log in virtually Patient leans: N/A  Psychiatric Specialty Exam: Review of Systems  There were no vitals taken for this visit.There is no height or weight on file to calculate BMI.  General Appearance: Unable to assess due to telephone visit. Patient unable to log in virtually  Eye Contact:  Unable to assess due to telephone visit. Patient unable to log in virtually  Speech:  Clear and Coherent and Normal Rate  Volume:  Normal  Mood:  Anxious  Affect:  Congruent  Thought Process:  Coherent, Goal Directed and Linear  Orientation:  Full (Time, Place, and Person)  Thought Content: Logical and Hallucinations: Auditory   Suicidal Thoughts:  No  Homicidal Thoughts:  No  Memory:   Immediate;   Good Recent;   Good Remote;   Good  Judgement:  Good  Insight:  Good  Psychomotor Activity:  Normal  Concentration:  Concentration: Good and Attention Span: Good  Recall:  Good  Fund of Knowledge: Good  Language: Good  Akathisia:  No  Handed:  Right  AIMS (if indicated): Not done  Assets:  Communication Skills Desire for Improvement Financial Resources/Insurance Housing  ADL's:  Intact  Cognition: WNL  Sleep:  Good   Screenings: AIMS     Admission (Discharged) from 01/06/2016 in [facility] Total Score 0    AUDIT     Admission (Discharged) from 05/18/2016 in [facility] Admission (Discharged) from 01/06/2016 in [facility]  Alcohol Use Disorder Identification Test Final Score (AUDIT) 0 0    GAD-7     Video Visit from 07/14/2020 in [facility]  Total GAD-7 Score 14    PHQ2-9     Video Visit from 07/14/2020 in [facility]  PHQ-2 Total Score 5  PHQ-9 Total Score 19       Assessment and Plan: Patient endorses symptoms of insomnia and auditory hallucinations. She is agreeable to adding perphenazine 4 mg nightly to help manage hallucinations. She is also agreeable increasing Depakote 750 mg to 1000 mg nightly to help manage hallucinations. She will continue all other medications as prescribed. She will have CBC and valproic acid level drawn at [facility] prior to follow-up appointment.   1. Recurrent major depressive disorder, in partial remission (HCC)  - CBC w/Diff/Platelet - Valproic Acid level - Continue traZODone (DESYREL) 100 MG tablet; Take 2 tablets (200 mg total) by mouth at bedtime.  Dispense: 60 tablet; Refill: 2 - Continue hydrOXYzine (VISTARIL) 25 MG capsule; Take 1 capsule (25 mg total) by mouth every 8 (eight) hours as needed for anxiety.  Dispense: 90 capsule; Refill: 2 - Continue busPIRone (BUSPAR) 15 MG tablet; Take 1  tablet (15 mg total) by mouth 3 (three) times daily.  Dispense: 90 tablet; Refill: 2 - Continue citalopram (CELEXA) 10 MG tablet; Take 1 tablet (10 mg total) by mouth daily.  Dispense: 30 tablet; Refill: 2  2. Schizophrenia, unspecified type ([place])  -  CBC w/Diff/Platelet - Valproic Acid level - Continue OLANZapine (ZYPREXA) 20 MG tablet; Take 1 tablet (20 mg total) by mouth at bedtime.  Dispense: 30 tablet; Refill: 2 - Increased divalproex (DEPAKOTE) 500 MG DR tablet; Take 2 tablets (1,000 mg total) by mouth at bedtime.  Dispense: 60 tablet; Refill: 2 - Start perphenazine 4 mg nightly  Follow-up in one month    Salley Slaughter, NP 07/14/2020, 4:15 PM

## 2020-07-27 ENCOUNTER — Ambulatory Visit (HOSPITAL_COMMUNITY): Payer: No Payment, Other | Admitting: Clinical

## 2020-07-27 ENCOUNTER — Other Ambulatory Visit: Payer: Self-pay

## 2020-07-29 ENCOUNTER — Other Ambulatory Visit: Payer: Self-pay

## 2020-07-29 ENCOUNTER — Telehealth (HOSPITAL_COMMUNITY): Payer: Self-pay | Admitting: Licensed Clinical Social Worker

## 2020-07-29 ENCOUNTER — Ambulatory Visit (HOSPITAL_COMMUNITY): Payer: No Payment, Other | Admitting: Licensed Clinical Social Worker

## 2020-07-29 NOTE — Telephone Encounter (Signed)
LCSW sent text link for video session per schedule. When pt failed to sign on LCSW called pt. The call went to vm. LCSW left detailed message re purpose of call including # for [facility] for pt to call back as desired to learn rescheduling options.

## 2020-08-26 ENCOUNTER — Other Ambulatory Visit (HOSPITAL_COMMUNITY): Payer: Self-pay | Admitting: Psychiatry

## 2020-08-26 ENCOUNTER — Other Ambulatory Visit: Payer: Self-pay

## 2020-08-26 ENCOUNTER — Encounter (HOSPITAL_COMMUNITY): Payer: Self-pay | Admitting: Psychiatry

## 2020-08-26 ENCOUNTER — Telehealth (INDEPENDENT_AMBULATORY_CARE_PROVIDER_SITE_OTHER): Payer: No Payment, Other | Admitting: Psychiatry

## 2020-08-26 DIAGNOSIS — F3341 Major depressive disorder, recurrent, in partial remission: Secondary | ICD-10-CM | POA: Diagnosis not present

## 2020-08-26 DIAGNOSIS — F209 Schizophrenia, unspecified: Secondary | ICD-10-CM

## 2020-08-26 MED ORDER — OLANZAPINE 20 MG PO TABS: 20.0000 mg | ORAL_TABLET | Freq: Every day | ORAL | 2 refills | Status: DC

## 2020-08-26 MED ORDER — HYDROXYZINE PAMOATE 25 MG PO CAPS: 25.0000 mg | ORAL_CAPSULE | Freq: Three times a day (TID) | ORAL | 2 refills | Status: DC | PRN

## 2020-08-26 MED ORDER — PERPHENAZINE 4 MG PO TABS: 4.0000 mg | ORAL_TABLET | Freq: Every day | ORAL | 2 refills | Status: DC

## 2020-08-26 MED ORDER — TRAZODONE HCL 100 MG PO TABS: 200.0000 mg | ORAL_TABLET | Freq: Every day | ORAL | 2 refills | Status: DC

## 2020-08-26 MED ORDER — BUSPIRONE HCL 15 MG PO TABS: 15.0000 mg | ORAL_TABLET | Freq: Three times a day (TID) | ORAL | 2 refills | Status: DC

## 2020-08-26 MED ORDER — CITALOPRAM HYDROBROMIDE 10 MG PO TABS: 10.0000 mg | ORAL_TABLET | Freq: Every day | ORAL | 2 refills | Status: DC

## 2020-08-26 MED ORDER — DIVALPROEX SODIUM 500 MG PO DR TAB: 1000.0000 mg | DELAYED_RELEASE_TABLET | Freq: Every evening | ORAL | 2 refills | Status: DC

## 2020-08-26 MED FILL — busPIRone HCL 15 MG TABS: 15 | 30 days supply | Qty: 90 | Fill #0

## 2020-08-26 MED FILL — DIVALPROEX SOD DR 500 MG TA: 500 | 30 days supply | Qty: 60 | Fill #0

## 2020-08-26 MED FILL — CITALOPRAM HBR 10 MG TABLET: 10 | 30 days supply | Qty: 30 | Fill #0

## 2020-08-26 MED FILL — PERPHENAZINE 4 MG TAB: 4 | 30 days supply | Qty: 30 | Fill #0

## 2020-08-26 MED FILL — TRAZODONE HCL 100 MG TABS: 100 | 30 days supply | Qty: 60 | Fill #0

## 2020-08-26 NOTE — Progress Notes (Signed)
[place] MD/PA/NP OP Progress Note Virtual Visit via Telephone Note  I connected with Vicki [facility] on 08/26/20 at  2:30 PM EST by telephone and verified that I am speaking with the correct person using two identifiers.  Location: Patient: home Provider: Clinic   I discussed the limitations, risks, security and privacy concerns of performing an evaluation and management service by telephone and the availability of in person appointments. I also discussed with the patient that there may be a patient responsible charge related to this service. The patient expressed understanding and agreed to proceed.   I provided 30 minutes of non-face-to-face time during this encounter.        08/26/2020 2:54 PM Vicki [facility]  MRN:  [id number]  Chief Complaint: "Im still having the hallcinations and the anxiety is really bad"  HPI: 39 year-old female seen today for follow up psychiatric evaluation. Patient has a psychiatric history of schizophrenia, depression, and anxiety. She is currently managed on Depakote 1000 mg nightly, Buspar 15 mg TID, Trazodone 200 mg nightly, Zyprexa 20 mg nightly, Hydroxyzine 25 mg TID, Perphenazine 4 mg nightly, and Celexa 10 mg daily. Today patient notes that medications are somewhat effective in managing her symptoms.   Today patient is unable to log on virtually so her assessment was done over the phone.  On exam she is pleasant, cooperative, and engaged in conversation. Patient describes mood as depressed and anxious. A PHQ-9 was conducted and patient scored 21, at last visit she scored a 19. A GAD-7 was also conducted and patient scored a 16, at last visit she scored a 14.  She notes that she was unable to fill her promethazine and Depakote because she could not afford it.  She notes that she has only been taking hydroxyzine and Zyprexa.  She notes that she constantly feels restless, on edge, and notes that she feels that something awful is going to happen.  She notes both  her anxiety and depression are exacerbated by her auditory hallucinations.  She notes that her hallucination continues to tell her to do things before she does them.  She denies SI/HI/AH.  [facility] so that she will be able to afford them.  She is agreeable to starting perphenazine 4 mg nightly to help manage symptoms of schizophrenia and hallucinations. Patient is agreeable to increasing depakote to 1000 mg nightly to help manage symptoms of schizophrenia.  She informed provider that because of her mental state she has not had her valproic acids or CBC redrawn.  She informed provider that she will try to have them done before next visit.   She will to continuing all other medications as prescribed.No other concerns noted at this time. Visit Diagnosis:    ICD-10-CM   1. Recurrent major depressive disorder, in partial remission (HCC)  F33.41 busPIRone (BUSPAR) 15 MG tablet    citalopram (CELEXA) 10 MG tablet    hydrOXYzine (VISTARIL) 25 MG capsule    traZODone (DESYREL) 100 MG tablet  2. Schizophrenia, unspecified type ([place])  F20.9 divalproex (DEPAKOTE) 500 MG DR tablet    OLANZapine (ZYPREXA) 20 MG tablet    perphenazine (TRILAFON) 4 MG tablet    Past Psychiatric History: Anxiety, depression, and schizophrenia Past Medical History:  Past Medical History:  Diagnosis Date  . Anxiety   . Crohn disease ([place])   . Depression   . Fibroid   . Infertility, female     Past Surgical History:  Procedure Laterality Date  .  COLON SURGERY  2000    due to Chron's age 79.  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  Marland Kitchen MYOMECTOMY  2019    Family Psychiatric History: Unknown  Family History:  Family History  Problem Relation Age of Onset  . Hyperlipidemia Mother   . Hypertension Mother   . Crohn's disease Father   . Crohn's disease Brother   . Mental illness Maternal Grandmother   . Bone cancer Maternal Grandfather   . Mental illness Paternal  Grandmother     Social History:  Social History   Socioeconomic History  . Marital status: Married    Spouse name: Not on file  . Number of children: Not on file  . Years of education: Not on file  . Highest education level: Not on file  Occupational History  . Not on file  Tobacco Use  . Smoking status: Never Smoker  . Smokeless tobacco: Never Used  Vaping Use  . Vaping Use: Never used  Substance and Sexual Activity  . Alcohol use: No  . Drug use: No  . Sexual activity: Yes    Partners: Male    Birth control/protection: None  Other Topics Concern  . Not on file  Social History Narrative  . Not on file   Social Determinants of Health   Financial Resource Strain: Not on file  Food Insecurity: Not on file  Transportation Needs: Not on file  Physical Activity: Not on file  Stress: Not on file  Social Connections: Not on file    Allergies: No Known Allergies  Metabolic Disorder Labs: Lab Results  Component Value Date   HGBA1C 5.4 08/01/2018   Lab Results  Component Value Date   PROLACTIN 171.3 (H) 05/19/2016   PROLACTIN 159.4 (H) 01/07/2016   Lab Results  Component Value Date   CHOL 196 05/29/2018   TRIG 177 (H) 05/29/2018   HDL 39 (L) 05/29/2018   CHOLHDL 5.0 (H) 05/29/2018   VLDL 19 05/19/2016   LDLCALC 122 (H) 05/29/2018   LDLCALC 129 (H) 05/19/2016   Lab Results  Component Value Date   TSH 2.384 05/19/2016   TSH 2.461 01/07/2016    Therapeutic Level Labs: No results found for: LITHIUM No results found for: VALPROATE No components found for:  CBMZ  Current Medications: Current Outpatient Medications  Medication Sig Dispense Refill  . busPIRone (BUSPAR) 15 MG tablet Take 1 tablet (15 mg total) by mouth 3 (three) times daily. 90 tablet 2  . citalopram (CELEXA) 10 MG tablet Take 1 tablet (10 mg total) by mouth daily. 30 tablet 2  . divalproex (DEPAKOTE) 500 MG DR tablet Take 2 tablets (1,000 mg total) by mouth at bedtime. 60 tablet 2  .  HYDROcodone-acetaminophen (NORCO/VICODIN) 5-325 MG tablet Take 1-2 tablets by mouth every 6 (six) hours as needed for severe pain. 12 tablet 0  . hydrOXYzine (VISTARIL) 25 MG capsule Take 1 capsule (25 mg total) by mouth every 8 (eight) hours as needed for anxiety. 90 capsule 2  . Multiple Vitamins-Minerals (MULTIVITAMIN PO) Take by mouth.    . OLANZapine (ZYPREXA) 20 MG tablet Take 1 tablet (20 mg total) by mouth at bedtime. 30 tablet 2  . ondansetron (ZOFRAN) 4 MG tablet Take 1 tablet (4 mg total) by mouth every 6 (six) hours. 12 tablet 0  . perphenazine (TRILAFON) 4 MG tablet Take 1 tablet (4 mg total) by mouth at bedtime. 30 tablet 2  . traZODone (DESYREL) 100 MG tablet  Take 2 tablets (200 mg total) by mouth at bedtime. 60 tablet 2   No current facility-administered medications for this visit.     Musculoskeletal: Strength & Muscle Tone: Unable to assess due to telephone visit. Patient unable to log in virtually Gait & Station: Unable to assess due to telephone visit. Patient unable to log in virtually Patient leans: N/A  Psychiatric Specialty Exam: Review of Systems  There were no vitals taken for this visit.There is no height or weight on file to calculate BMI.  General Appearance: Unable to assess due to telephone visit. Patient unable to log in virtually  Eye Contact:  Unable to assess due to telephone visit. Patient unable to log in virtually  Speech:  Clear and Coherent and Normal Rate  Volume:  Normal  Mood:  Anxious and Depressed  Affect:  Congruent  Thought Process:  Coherent, Goal Directed and Linear  Orientation:  Full (Time, Place, and Person)  Thought Content: Logical and Hallucinations: Auditory   Suicidal Thoughts:  No  Homicidal Thoughts:  No  Memory:  Immediate;   Good Recent;   Good Remote;   Good  Judgement:  Good  Insight:  Good  Psychomotor Activity:  Normal  Concentration:  Concentration: Good and Attention Span: Good  Recall:  Good  Fund of Knowledge:  Good  Language: Good  Akathisia:  No  Handed:  Right  AIMS (if indicated): Not done  Assets:  Communication Skills Desire for Improvement Financial Resources/Insurance Housing  ADL's:  Intact  Cognition: WNL  Sleep:  Good   Screenings: AIMS   Flowsheet Row Admission (Discharged) from 01/06/2016 in [facility] Total Score 0    AUDIT   Flowsheet Row Admission (Discharged) from 05/18/2016 in [facility] Admission (Discharged) from 01/06/2016 in [facility]  Alcohol Use Disorder Identification Test Final Score (AUDIT) 0 0    GAD-7   Flowsheet Row Video Visit from 08/26/2020 in [facility] Video Visit from 07/14/2020 in [facility]  Total GAD-7 Score 16 14    PHQ2-9   Flowsheet Row Video Visit from 08/26/2020 in [facility] Video Visit from 07/14/2020 in [facility]  PHQ-2 Total Score 5 5  PHQ-9 Total Score 21 19       Assessment and Plan: Patient endorses symptoms of anxiety, depression, and auditory hallucinations. Patient's medication [facility] to [facility] so that she will be able to afford them.  She is agreeable to starting perphenazine 4 mg nightly to help manage symptoms of schizophrenia and hallucinations. Patient is agreeable to increasing depakote to 1000 mg nightly to help manage symptoms of schizophrenia.  She informed provider that because of her mental state she has not had her valproic acids or CBC redrawn.  She informed provider that she will try to have them done before next visit.  1. Recurrent major depressive disorder, in partial remission (HCC)  - CBC w/Diff/Platelet - Valproic Acid level - Continue traZODone (DESYREL) 100 MG tablet; Take 2 tablets (200 mg total) by mouth at bedtime.  Dispense: 60 tablet; Refill: 2 - Continue hydrOXYzine (VISTARIL) 25 MG capsule;  Take 1 capsule (25 mg total) by mouth every 8 (eight) hours as needed for anxiety.  Dispense: 90 capsule; Refill: 2 - Continue busPIRone (BUSPAR) 15 MG tablet; Take 1 tablet (15 mg total) by mouth 3 (three) times daily.  Dispense: 90 tablet; Refill: 2 -  Continue citalopram (CELEXA) 10 MG tablet; Take 1 tablet (10 mg total) by mouth daily.  Dispense: 30 tablet; Refill: 2  2. Schizophrenia, unspecified type ([place])  - CBC w/Diff/Platelet - Valproic Acid level - Continue OLANZapine (ZYPREXA) 20 MG tablet; Take 1 tablet (20 mg total) by mouth at bedtime.  Dispense: 30 tablet; Refill: 2 - Increased divalproex (DEPAKOTE) 500 MG DR tablet; Take 2 tablets (1,000 mg total) by mouth at bedtime.  Dispense: 60 tablet; Refill: 2 - Start perphenazine 4 mg nightly  Follow-up in 3 month    Salley Slaughter, NP 08/26/2020, 2:54 PM

## 2020-10-22 ENCOUNTER — Other Ambulatory Visit (HOSPITAL_COMMUNITY): Payer: Self-pay | Admitting: Psychiatry

## 2020-10-22 ENCOUNTER — Telehealth (HOSPITAL_COMMUNITY): Payer: Self-pay

## 2020-10-22 DIAGNOSIS — F209 Schizophrenia, unspecified: Secondary | ICD-10-CM

## 2020-10-22 MED ORDER — OLANZAPINE 20 MG PO TABS
20.0000 mg | ORAL_TABLET | Freq: Every day | ORAL | 2 refills | Status: DC
Start: 2020-10-22 — End: 2020-11-24

## 2020-10-22 NOTE — Telephone Encounter (Signed)
Medications sent to preferred pharmacy.

## 2020-10-22 NOTE — Telephone Encounter (Signed)
Patient called requesting a refill on her [facility] [address] [place] She has about 10 tablets left.  Followup scheduled for 3/15. Thank you

## 2020-10-27 NOTE — Telephone Encounter (Signed)
Notified patient.

## 2020-11-24 ENCOUNTER — Telehealth (INDEPENDENT_AMBULATORY_CARE_PROVIDER_SITE_OTHER): Payer: No Payment, Other | Admitting: Psychiatry

## 2020-11-24 ENCOUNTER — Other Ambulatory Visit: Payer: Self-pay

## 2020-11-24 ENCOUNTER — Encounter (HOSPITAL_COMMUNITY): Payer: Self-pay | Admitting: Psychiatry

## 2020-11-24 DIAGNOSIS — F209 Schizophrenia, unspecified: Secondary | ICD-10-CM | POA: Diagnosis not present

## 2020-11-24 DIAGNOSIS — F3341 Major depressive disorder, recurrent, in partial remission: Secondary | ICD-10-CM | POA: Diagnosis not present

## 2020-11-24 MED ORDER — OLANZAPINE 20 MG PO TABS
20.0000 mg | ORAL_TABLET | Freq: Every day | ORAL | 2 refills | Status: DC
Start: 2020-11-24 — End: 2021-02-18

## 2020-11-24 MED ORDER — HYDROXYZINE PAMOATE 25 MG PO CAPS: 25.0000 mg | ORAL_CAPSULE | Freq: Three times a day (TID) | ORAL | 2 refills | Status: DC | PRN

## 2020-11-24 MED ORDER — DIVALPROEX SODIUM 500 MG PO DR TAB: 1000.0000 mg | DELAYED_RELEASE_TABLET | Freq: Every evening | ORAL | 2 refills | Status: DC

## 2020-11-24 MED ORDER — TRAZODONE HCL 100 MG PO TABS: 200.0000 mg | ORAL_TABLET | Freq: Every day | ORAL | 2 refills | Status: DC

## 2020-11-24 MED ORDER — CITALOPRAM HYDROBROMIDE 20 MG PO TABS: 20.0000 mg | ORAL_TABLET | Freq: Every day | ORAL | 2 refills | Status: DC

## 2020-11-24 MED ORDER — BUSPIRONE HCL 15 MG PO TABS: 15.0000 mg | ORAL_TABLET | Freq: Three times a day (TID) | ORAL | 2 refills | Status: DC

## 2020-11-24 NOTE — Progress Notes (Signed)
[place] MD/PA/NP OP Progress Note Virtual Visit via Telephone Note  I connected with Vicki Lee on 11/24/20 at  2:30 PM EDT by telephone and verified that I am speaking with the correct person using two identifiers.  Location: Patient: home Provider: Clinic   I discussed the limitations, risks, security and privacy concerns of performing an evaluation and management service by telephone and the availability of in person appointments. I also discussed with the patient that there may be a patient responsible charge related to this service. The patient expressed understanding and agreed to proceed.   I provided 30 minutes of non-face-to-face time during this encounter.        11/24/2020 10:47 AM Vicki Lee  MRN:  [id number]  Chief Complaint: "I have been having real bad anxiety"  HPI: 40 year-old female seen today for follow up psychiatric evaluation. Patient has a psychiatric history of schizophrenia, depression, and anxiety. She is currently managed on Depakote 1000 mg nightly, Buspar 15 mg TID, Trazodone 200 mg nightly, Zyprexa 20 mg nightly, Hydroxyzine 25 mg TID, Perphenazine 4 mg nightly, and Celexa 10 mg daily. Today patient notes that medications are somewhat effective in managing her symptoms.   Today patient is unable to log on virtually so her assessment was done over the phone.  On exam she is pleasant, cooperative, and engaged in conversation. Patient notes that since her last visit her anxiety has worsened.  She also notes that her auditory hallucinations has worsened.  Patient notes that she constantly feels on edge and paranoid.  She notes her paranoid coincides with her anxiety and hallucinations.  Today provider conducted a GAD-7 and patient scored a 19, at her last visit patient scored a 16.  Provider also conducted a PHQ-9 and patient scored a 18, at her last visit she scored a 21.  Patient endorses auditory hallucinations.  She notes that her hallucination continues  to tell her to do things before she does them.  She denies SI/HI/AH.  Patient notes that she found perphenazine ineffective and reports that she would like to discontinue it.  She notes that she only finds Zyprexa and hydroxyzine effective.  She is agreeable today to increasing Celexa 10 mg to 20 mg to help manage anxiety and depression.  She will continue all other medications as prescribed.  Patient notes that she will be seeking further consultation at a psychiatric facility in [facility].  Provider endorsed understanding.  Provider also informed patient that if she chooses to follow back up with [facility] and neurology consult will be recommended.  She endorsed understanding.  No other concerns noted at this time.   Visit Diagnosis:    ICD-10-CM   1. Recurrent major depressive disorder, in partial remission (HCC)  F33.41 busPIRone (BUSPAR) 15 MG tablet    citalopram (CELEXA) 20 MG tablet    hydrOXYzine (VISTARIL) 25 MG capsule    traZODone (DESYREL) 100 MG tablet  2. Schizophrenia, unspecified type ([place])  F20.9 divalproex (DEPAKOTE) 500 MG DR tablet    OLANZapine (ZYPREXA) 20 MG tablet    Past Psychiatric History: Anxiety, depression, and schizophrenia Past Medical History:  Past Medical History:  Diagnosis Date  . Anxiety   . Crohn disease ([place])   . Depression   . Fibroid   . Infertility, female     Past Surgical History:  Procedure Laterality Date  . COLON SURGERY  2000    due to Chron's age 73.  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  Marland Kitchen MYOMECTOMY  2019    Family Psychiatric History: Unknown  Family History:  Family History  Problem Relation Age of Onset  . Hyperlipidemia Mother   . Hypertension Mother   . Crohn's disease Father   . Crohn's disease Brother   . Mental illness Maternal Grandmother   . Bone cancer Maternal Grandfather   . Mental illness Paternal Grandmother     Social History:  Social History   Socioeconomic History  . Marital status: Married     Spouse name: Not on file  . Number of children: Not on file  . Years of education: Not on file  . Highest education level: Not on file  Occupational History  . Not on file  Tobacco Use  . Smoking status: Never Smoker  . Smokeless tobacco: Never Used  Vaping Use  . Vaping Use: Never used  Substance and Sexual Activity  . Alcohol use: No  . Drug use: No  . Sexual activity: Yes    Partners: Male    Birth control/protection: None  Other Topics Concern  . Not on file  Social History Narrative  . Not on file   Social Determinants of Health   Financial Resource Strain: Not on file  Food Insecurity: Not on file  Transportation Needs: Not on file  Physical Activity: Not on file  Stress: Not on file  Social Connections: Not on file    Allergies: No Known Allergies  Metabolic Disorder Labs: Lab Results  Component Value Date   HGBA1C 5.4 08/01/2018   Lab Results  Component Value Date   PROLACTIN 171.3 (H) 05/19/2016   PROLACTIN 159.4 (H) 01/07/2016   Lab Results  Component Value Date   CHOL 196 05/29/2018   TRIG 177 (H) 05/29/2018   HDL 39 (L) 05/29/2018   CHOLHDL 5.0 (H) 05/29/2018   VLDL 19 05/19/2016   LDLCALC 122 (H) 05/29/2018   LDLCALC 129 (H) 05/19/2016   Lab Results  Component Value Date   TSH 2.384 05/19/2016   TSH 2.461 01/07/2016    Therapeutic Level Labs: No results found for: LITHIUM No results found for: VALPROATE No components found for:  CBMZ  Current Medications: Current Outpatient Medications  Medication Sig Dispense Refill  . busPIRone (BUSPAR) 15 MG tablet Take 1 tablet (15 mg total) by mouth 3 (three) times daily. 90 tablet 2  . citalopram (CELEXA) 20 MG tablet Take 1 tablet (20 mg total) by mouth daily. 30 tablet 2  . divalproex (DEPAKOTE) 500 MG DR tablet Take 2 tablets (1,000 mg total) by mouth at bedtime. 60 tablet 2  . HYDROcodone-acetaminophen (NORCO/VICODIN) 5-325 MG tablet Take 1-2 tablets by mouth every 6 (six) hours as needed  for severe pain. 12 tablet 0  . hydrOXYzine (VISTARIL) 25 MG capsule Take 1 capsule (25 mg total) by mouth every 8 (eight) hours as needed for anxiety. 90 capsule 2  . Multiple Vitamins-Minerals (MULTIVITAMIN PO) Take by mouth.    . OLANZapine (ZYPREXA) 20 MG tablet Take 1 tablet (20 mg total) by mouth at bedtime. 30 tablet 2  . ondansetron (ZOFRAN) 4 MG tablet Take 1 tablet (4 mg total) by mouth every 6 (six) hours. 12 tablet 0  . traZODone (DESYREL) 100 MG tablet Take 2 tablets (200 mg total) by mouth at bedtime. 60 tablet 2   No current facility-administered medications for this visit.     Musculoskeletal: Strength & Muscle Tone: Unable to assess due to telephone visit. Patient unable to log in  virtually Gait & Station: Unable to assess due to telephone visit. Patient unable to log in virtually Patient leans: N/A  Psychiatric Specialty Exam: Review of Systems  There were no vitals taken for this visit.There is no height or weight on file to calculate BMI.  General Appearance: Unable to assess due to telephone visit. Patient unable to log in virtually  Eye Contact:  Unable to assess due to telephone visit. Patient unable to log in virtually  Speech:  Clear and Coherent and Normal Rate  Volume:  Normal  Mood:  Anxious and Depressed  Affect:  Congruent  Thought Process:  Coherent, Goal Directed and Linear  Orientation:  Full (Time, Place, and Person)  Thought Content: Logical and Hallucinations: Auditory   Suicidal Thoughts:  No  Homicidal Thoughts:  No  Memory:  Immediate;   Good Recent;   Good Remote;   Good  Judgement:  Good  Insight:  Good  Psychomotor Activity:  Normal  Concentration:  Concentration: Good and Attention Span: Good  Recall:  Good  Fund of Knowledge: Good  Language: Good  Akathisia:  No  Handed:  Right  AIMS (if indicated): Not done  Assets:  Communication Skills Desire for Improvement Financial Resources/Insurance Housing  ADL's:  Intact  Cognition:  WNL  Sleep:  Good   Screenings: AIMS   Flowsheet Row Admission (Discharged) from 01/06/2016 in [place] Total Score 0    AUDIT   Flowsheet Row Admission (Discharged) from 05/18/2016 in [place] Admission (Discharged) from 01/06/2016 in [place]  Alcohol Use Disorder Identification Test Final Score (AUDIT) 0 0    GAD-7   Flowsheet Row Video Visit from 11/24/2020 in [facility] Video Visit from 08/26/2020 in [facility] Video Visit from 07/14/2020 in [facility]  Total GAD-7 Score 19 16 14     PHQ2-9   Flowsheet Row Video Visit from 11/24/2020 in [facility] Video Visit from 08/26/2020 in [facility] Video Visit from 07/14/2020 in [facility]  PHQ-2 Total Score 3 5 5   PHQ-9 Total Score 18 21 19     Flowsheet Row Video Visit from 11/24/2020 in [place] No Risk       Assessment and Plan: Patient endorses symptoms of anxiety, depression, and auditory hallucinations. Patient notes that she found perphenazine ineffective and reports that she would like to discontinue it.  She notes that she only finds Zyprexa and hydroxyzine effective.  She is agreeable today to increasing Celexa 10 mg to 20 mg to help manage anxiety and depression.  She will continue all other medications as prescribed.  Patient notes that she will be seeking further consultation at a psychiatric facility in [facility].  Provider endorsed understanding.  Provider also informed patient that if she chooses to follow back up with [facility] and neurology consult will be recommended.   1. Recurrent major depressive disorder, in partial remission (HCC)  Continue- busPIRone (BUSPAR) 15 MG tablet; Take 1 tablet (15 mg total) by mouth 3  (three) times daily.  Dispense: 90 tablet; Refill: 2 Increased- citalopram (CELEXA) 20 MG tablet; Take 1 tablet (20 mg total) by mouth daily.  Dispense: 30 tablet; Refill: 2 Continue- hydrOXYzine (VISTARIL) 25 MG capsule; Take 1 capsule (25 mg total) by mouth every 8 (eight) hours as needed for anxiety.  Dispense: 90 capsule; Refill:  2 Continue- traZODone (DESYREL) 100 MG tablet; Take 2 tablets (200 mg total) by mouth at bedtime.  Dispense: 60 tablet; Refill: 2  2. Schizophrenia, unspecified type ([place])  Continue- divalproex (DEPAKOTE) 500 MG DR tablet; Take 2 tablets (1,000 mg total) by mouth at bedtime.  Dispense: 60 tablet; Refill: 2 Continue- OLANZapine (ZYPREXA) 20 MG tablet; Take 1 tablet (20 mg total) by mouth at bedtime.  Dispense: 30 tablet; Refill: 2  Follow-up in 3 month    Salley Slaughter, NP 11/24/2020, 10:47 AM

## 2020-11-27 ENCOUNTER — Telehealth (HOSPITAL_COMMUNITY): Payer: Self-pay | Admitting: Psychiatry

## 2020-11-27 NOTE — Telephone Encounter (Signed)
Patient called to request a referral to a neurologist in [place], [place] with [place] informed provider will see this message when back in office and contact her for further information.

## 2020-11-30 ENCOUNTER — Other Ambulatory Visit (HOSPITAL_COMMUNITY): Payer: Self-pay | Admitting: Psychiatry

## 2020-11-30 DIAGNOSIS — F200 Paranoid schizophrenia: Secondary | ICD-10-CM

## 2020-11-30 DIAGNOSIS — R440 Auditory hallucinations: Secondary | ICD-10-CM

## 2020-11-30 NOTE — Telephone Encounter (Signed)
Patient notes that she is having continuous auditory hallucinations.  At this time she notes that she has been taking her antipsychotics as prescribed and notes that they have been ineffective in managing these hallucinations.  She would like to be seen by neurology for further assessment.  Provider referred patient to [facility] neurology.  No other concerns at this time.

## 2021-02-18 ENCOUNTER — Telehealth (INDEPENDENT_AMBULATORY_CARE_PROVIDER_SITE_OTHER): Payer: No Payment, Other | Admitting: Psychiatry

## 2021-02-18 ENCOUNTER — Other Ambulatory Visit: Payer: Self-pay

## 2021-02-18 ENCOUNTER — Encounter (HOSPITAL_COMMUNITY): Payer: Self-pay | Admitting: Psychiatry

## 2021-02-18 DIAGNOSIS — R42 Dizziness and giddiness: Secondary | ICD-10-CM | POA: Diagnosis not present

## 2021-02-18 DIAGNOSIS — F250 Schizoaffective disorder, bipolar type: Secondary | ICD-10-CM | POA: Diagnosis not present

## 2021-02-18 DIAGNOSIS — F411 Generalized anxiety disorder: Secondary | ICD-10-CM | POA: Diagnosis not present

## 2021-02-18 MED ORDER — TRAZODONE HCL 100 MG PO TABS
200.0000 mg | ORAL_TABLET | Freq: Every day | ORAL | 2 refills | Status: DC
Start: 2021-02-18 — End: 2021-05-20

## 2021-02-18 MED ORDER — OLANZAPINE 20 MG PO TABS: 20.0000 mg | ORAL_TABLET | Freq: Every day | ORAL | 2 refills | Status: DC

## 2021-02-18 MED ORDER — DIVALPROEX SODIUM 500 MG PO DR TAB: 1500.0000 mg | DELAYED_RELEASE_TABLET | Freq: Every evening | ORAL | 2 refills | Status: DC

## 2021-02-18 MED ORDER — CITALOPRAM HYDROBROMIDE 20 MG PO TABS: 20.0000 mg | ORAL_TABLET | Freq: Every day | ORAL | 2 refills | Status: DC

## 2021-02-18 MED ORDER — BUSPIRONE HCL 15 MG PO TABS: 15.0000 mg | ORAL_TABLET | Freq: Three times a day (TID) | ORAL | 2 refills | Status: DC

## 2021-02-18 MED ORDER — HYDROXYZINE PAMOATE 50 MG PO CAPS
50.0000 mg | ORAL_CAPSULE | Freq: Three times a day (TID) | ORAL | 2 refills | Status: DC | PRN
Start: 2021-02-18 — End: 2021-05-20

## 2021-02-18 NOTE — Progress Notes (Signed)
[place] MD/PA/NP OP Progress Note Virtual Visit via Video Note  I connected with Vicki Lee on 02/18/21 at  1:30 PM EDT by a video enabled telemedicine application and verified that I am speaking with the correct person using two identifiers.  Location: Patient: Home Provider: Clinic   I discussed the limitations of evaluation and management by telemedicine and the availability of in person appointments. The patient expressed understanding and agreed to proceed.  I provided 30 minutes of non-face-to-face time during this encounter.          02/18/2021 4:08 PM Vicki Lee  MRN:  [id number]  Chief Complaint: "Things are not good. I feel a little dizzy in my head"  HPI: 40 year-old female seen today for follow up psychiatric evaluation. Patient has a psychiatric history of schizophrenia, depression, and anxiety. She is currently managed on Depakote 1000 mg nightly, Buspar 15 mg TID, Trazodone 200 mg nightly, Zyprexa 20 mg nightly, Hydroxyzine 25 mg TID, and Celexa 20 mg daily. Today patient notes that medications are somewhat effective in managing her symptoms.   Today patient is unable to log on virtually so her assessment was done over the phone.  On exam she is pleasant, cooperative, and engaged in conversation. Patient notes that since her last visit her hallucinations have gotten worse.  Provider informed patient that she saw that she went to [facility] with a similar complaint and they attempted to start her on Geodon 60 mg for a week and then increasing to 120 mg, Belsomra and Cogentin.  Patient notes that she never started this regimen because she was unable to afford her medications.  Provider asked patient if she would like to try this regimen and [facility]  She notes that at this time she would not and request to be sent to neurology.  Patient was referred to [facility] Neurology however patient and  provider was informed that the referral was inappropriate and requested patient be sent to another neurology clinic.  Provider referred patient to [facility] neurology.    Patient notes that the above exacerbates her anxiety and depression.  Provider conducted a GAD-7 and patient scored a 21, at her last visit she scored a 19.  Provider also conducted a PHQ-9 he scored a 25, at her last visit she scored an 66.  She endorses increased appetite and increased weight.  She notes within the last 3 months she has gained 15 pounds.  She notes that her sleep continues to be poor noting that she sleeps 4 to 5 hours nightly.  Today she denies SI/HI/VH.  She continues to endorse auditory hallucinations.  He also endorses fluctuations in mood, increased irritability, distractibility, and racing thoughts.  Patient notes that for the last few weeks she has been feeling dizzy.  Provider asked if her dizziness occurs when she is anxious.  She notes that it happens when she is anxious and when she is not anxious.  Today she is agreeable to increasing hydroxyzine 25 mg 3 times daily to 50 mg 3 times daily to help manage anxiety.  She will follow-up with [place] neurology for further evaluation.  Depakote 1000 mg was increased to 1500 mg daily to help manage mood symptoms of psychosis.  She will continue all other medication as prescribed. No other concerns noted at this time.   Visit Diagnosis:    ICD-10-CM   1. Dizziness, nonspecific  R42 Ambulatory referral to Neurology    2.  Schizoaffective disorder, bipolar type (HCC)  F25.0 busPIRone (BUSPAR) 15 MG tablet    citalopram (CELEXA) 20 MG tablet    divalproex (DEPAKOTE) 500 MG DR tablet    OLANZapine (ZYPREXA) 20 MG tablet    traZODone (DESYREL) 100 MG tablet    Ambulatory referral to Neurology    3. Generalized anxiety disorder  F41.1 busPIRone (BUSPAR) 15 MG tablet    hydrOXYzine (VISTARIL) 50 MG capsule      Past Psychiatric History: Anxiety, depression, and  schizophrenia Past Medical History:  Past Medical History:  Diagnosis Date   Anxiety    Crohn disease ([place])    Depression    Fibroid    Infertility, female     Past Surgical History:  Procedure Laterality Date   COLON SURGERY  2000    due to Chron's age 51.   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   MYOMECTOMY  2019    Family Psychiatric History: Unknown  Family History:  Family History  Problem Relation Age of Onset   Hyperlipidemia Mother    Hypertension Mother    Crohn's disease Father    Crohn's disease Brother    Mental illness Maternal Grandmother    Bone cancer Maternal Grandfather    Mental illness Paternal Grandmother     Social History:  Social History   Socioeconomic History   Marital status: Married    Spouse name: Not on file   Number of children: Not on file   Years of education: Not on file   Highest education level: Not on file  Occupational History   Not on file  Tobacco Use   Smoking status: Never   Smokeless tobacco: Never  Vaping Use   Vaping Use: Never used  Substance and Sexual Activity   Alcohol use: No   Drug use: No   Sexual activity: Yes    Partners: Male    Birth control/protection: None  Other Topics Concern   Not on file  Social History Narrative   Not on file   Social Determinants of Health   Financial Resource Strain: Not on file  Food Insecurity: Not on file  Transportation Needs: Not on file  Physical Activity: Not on file  Stress: Not on file  Social Connections: Not on file    Allergies: Not on File  Metabolic Disorder Labs: Lab Results  Component Value Date   HGBA1C 5.4 08/01/2018   Lab Results  Component Value Date   PROLACTIN 171.3 (H) 05/19/2016   PROLACTIN 159.4 (H) 01/07/2016   Lab Results  Component Value Date   CHOL 196 05/29/2018   TRIG 177 (H) 05/29/2018   HDL 39 (L) 05/29/2018   CHOLHDL 5.0 (H) 05/29/2018   VLDL 19 05/19/2016   LDLCALC 122 (H) 05/29/2018   LDLCALC 129 (H) 05/19/2016    Lab Results  Component Value Date   TSH 2.384 05/19/2016   TSH 2.461 01/07/2016    Therapeutic Level Labs: No results found for: LITHIUM No results found for: VALPROATE No components found for:  CBMZ  Current Medications: Current Outpatient Medications  Medication Sig Dispense Refill   busPIRone (BUSPAR) 15 MG tablet Take 1 tablet (15 mg total) by mouth 3 (three) times daily. 90 tablet 2   citalopram (CELEXA) 20 MG tablet Take 1 tablet (20 mg total) by mouth daily. 30 tablet 2   divalproex (DEPAKOTE) 500 MG DR tablet Take 3 tablets (1,500 mg total) by mouth at bedtime. 90 tablet 2  HYDROcodone-acetaminophen (NORCO/VICODIN) 5-325 MG tablet Take 1-2 tablets by mouth every 6 (six) hours as needed for severe pain. 12 tablet 0   hydrOXYzine (VISTARIL) 50 MG capsule Take 1 capsule (50 mg total) by mouth every 8 (eight) hours as needed for anxiety. 90 capsule 2   Multiple Vitamins-Minerals (MULTIVITAMIN PO) Take by mouth.     OLANZapine (ZYPREXA) 20 MG tablet Take 1 tablet (20 mg total) by mouth at bedtime. 30 tablet 2   ondansetron (ZOFRAN) 4 MG tablet Take 1 tablet (4 mg total) by mouth every 6 (six) hours. 12 tablet 0   traZODone (DESYREL) 100 MG tablet Take 2 tablets (200 mg total) by mouth at bedtime. 60 tablet 2   No current facility-administered medications for this visit.     Musculoskeletal: Strength & Muscle Tone:  Unable to assess due to telehealth visit [place]:  Unable to assess due to televisit visit.  Patient leans: N/A  Psychiatric Specialty Exam: Review of Systems  There were no vitals taken for this visit.There is no height or weight on file to calculate BMI.  General Appearance: Well Groomed  Eye Contact:  Good  Speech:  Clear and Coherent and Normal Rate  Volume:  Normal  Mood:  Anxious and Depressed  Affect:  Congruent  Thought Process:  Coherent, Goal Directed and Linear  Orientation:  Full (Time, Place, and Person)  Thought Content: Logical and  Hallucinations: Auditory   Suicidal Thoughts:  No  Homicidal Thoughts:  No  Memory:  Immediate;   Good Recent;   Good Remote;   Good  Judgement:  Good  Insight:  Good  Psychomotor Activity:  Normal  Concentration:  Concentration: Good and Attention Span: Good  Recall:  Good  Fund of Knowledge: Good  Language: Good  Akathisia:  No  Handed:  Right  AIMS (if indicated): Not done  Assets:  Communication Skills Desire for Improvement Financial Resources/Insurance Housing  ADL's:  Intact  Cognition: WNL  Sleep:  Fair   Screenings: AIMS    Flowsheet Row Admission (Discharged) from 01/06/2016 in [place] Total Score 0      AUDIT    Flowsheet Row Admission (Discharged) from 05/18/2016 in [place] Admission (Discharged) from 01/06/2016 in [place]  Alcohol Use Disorder Identification Test Final Score (AUDIT) 0 0      GAD-7    Flowsheet Row Video Visit from 02/18/2021 in [facility] Video Visit from 11/24/2020 in [facility] Video Visit from 08/26/2020 in [facility] Video Visit from 07/14/2020 in [facility]  Total GAD-7 Score 21 19 16 14       PHQ2-9    Flowsheet Row Video Visit from 02/18/2021 in [facility] Video Visit from 11/24/2020 in [facility] Video Visit from 08/26/2020 in [facility] Video Visit from 07/14/2020 in [place]  PHQ-2 Total Score 6 3 5 5   PHQ-9 Total Score 25 18 21 19       Flowsheet Row Video Visit from 02/18/2021 in [facility] Video Visit from 11/24/2020 in [place] Error: Q7 should not be populated when Q6 is No No Risk        Assessment and Plan: Patient  endorses symptoms of anxiety, depression, hypomania, and auditory hallucinations. Today she is agreeable to  increasing hydroxyzine 25 mg 3 times daily to 50 mg 3 times daily to help manage anxiety.  She will follow-up with [place] neurology for further evaluation.  Depakote 1000 mg was increased to 1500 mg daily to help manage mood symptoms of psychosis.  She will continue all other medication as prescribed.   1. Dizziness, nonspecific  - Ambulatory referral to Neurology  2. Schizoaffective disorder, bipolar type (HCC)  Continue- busPIRone (BUSPAR) 15 MG tablet; Take 1 tablet (15 mg total) by mouth 3 (three) times daily.  Dispense: 90 tablet; Refill: 2 Continue- citalopram (CELEXA) 20 MG tablet; Take 1 tablet (20 mg total) by mouth daily.  Dispense: 30 tablet; Refill: 2 Increased- divalproex (DEPAKOTE) 500 MG DR tablet; Take 3 tablets (1,500 mg total) by mouth at bedtime.  Dispense: 90 tablet; Refill: 2 Continue- OLANZapine (ZYPREXA) 20 MG tablet; Take 1 tablet (20 mg total) by mouth at bedtime.  Dispense: 30 tablet; Refill: 2 Continue- traZODone (DESYREL) 100 MG tablet; Take 2 tablets (200 mg total) by mouth at bedtime.  Dispense: 60 tablet; Refill: 2 - Ambulatory referral to Neurology  3. Generalized anxiety disorder  Increased- busPIRone (BUSPAR) 15 MG tablet; Take 1 tablet (15 mg total) by mouth 3 (three) times daily.  Dispense: 90 tablet; Refill: 2 Continue- hydrOXYzine (VISTARIL) 50 MG capsule; Take 1 capsule (50 mg total) by mouth every 8 (eight) hours as needed for anxiety.  Dispense: 90 capsule; Refill: 2    Follow-up in 3 month    Salley Slaughter, NP 02/18/2021, 4:08 PM

## 2021-05-20 ENCOUNTER — Encounter (HOSPITAL_COMMUNITY): Payer: Self-pay | Admitting: Psychiatry

## 2021-05-20 ENCOUNTER — Other Ambulatory Visit: Payer: Self-pay

## 2021-05-20 ENCOUNTER — Telehealth (INDEPENDENT_AMBULATORY_CARE_PROVIDER_SITE_OTHER): Payer: No Payment, Other | Admitting: Psychiatry

## 2021-05-20 DIAGNOSIS — F250 Schizoaffective disorder, bipolar type: Secondary | ICD-10-CM | POA: Diagnosis not present

## 2021-05-20 DIAGNOSIS — F411 Generalized anxiety disorder: Secondary | ICD-10-CM

## 2021-05-20 MED ORDER — OLANZAPINE 20 MG PO TABS: 20.0000 mg | ORAL_TABLET | Freq: Every day | ORAL | 3 refills | Status: DC

## 2021-05-20 MED ORDER — HYDROXYZINE PAMOATE 50 MG PO CAPS
50.0000 mg | ORAL_CAPSULE | Freq: Three times a day (TID) | ORAL | 3 refills | Status: DC | PRN
Start: 2021-05-20 — End: 2021-08-24

## 2021-05-20 MED ORDER — DIVALPROEX SODIUM 500 MG PO DR TAB: 1500.0000 mg | DELAYED_RELEASE_TABLET | Freq: Every evening | ORAL | 3 refills | Status: DC

## 2021-05-20 MED ORDER — BUSPIRONE HCL 15 MG PO TABS: 15.0000 mg | ORAL_TABLET | Freq: Three times a day (TID) | ORAL | 3 refills | Status: DC

## 2021-05-20 MED ORDER — TRAZODONE HCL 100 MG PO TABS
200.0000 mg | ORAL_TABLET | Freq: Every day | ORAL | 3 refills | Status: DC
Start: 2021-05-20 — End: 2021-08-24

## 2021-05-20 MED ORDER — CITALOPRAM HYDROBROMIDE 20 MG PO TABS: 20.0000 mg | ORAL_TABLET | Freq: Every day | ORAL | 3 refills | Status: DC

## 2021-05-20 NOTE — Progress Notes (Signed)
[place] MD/PA/NP OP Progress Note Virtual Visit via Video Note  I connected with Vicki Lee on 05/20/21 at  2:30 PM EDT by a video enabled telemedicine application and verified that I am speaking with the correct person using two identifiers.  Location: Patient: Home Provider: Clinic   I discussed the limitations of evaluation and management by telemedicine and the availability of in person appointments. The patient expressed understanding and agreed to proceed.  I provided 30 minutes of non-face-to-face time during this encounter.          05/20/2021 2:47 PM Vicki Lee  MRN:  [id number]  Chief Complaint: "I really want the voices to go away"  HPI: 40 year-old female seen today for follow up psychiatric evaluation. Patient has a psychiatric history of schizophrenia, depression, and anxiety. She is currently managed on Depakote 1500 mg nightly, Buspar 15 mg TID, Trazodone 200 mg nightly, Zyprexa 20 mg nightly, Hydroxyzine 50 mg TID, and Celexa 20 mg daily. Today patient notes that medications are somewhat effective in managing her symptoms.   Today patient is well-groomed, pleasant, cooperative, and engaged in conversation.  She notes that since 2016 she has been suffering from hallucinations.  She notes that since her last visit that he has not improved and reports that she would like them to go away.  At this time she notes that she does not want medication adjustments.  Provider asked patient if she followed up with neurology and she notes that she had not.  She notes that she has not taken her Depakote and a little over a month because she cannot afford it.  Patient notes that her voices tell her what to do and notes that they become worse at night.  She informed Probation officer that they interfere with her activities of daily living.    Patient informed Probation officer that her voices make her more anxious and depressed.  Provider conducted a GAD-7 and patient scored a 14, at her last visit she  scored a 21.  Provider also conducted a PHQ-9 and patient scored a 17, at her last visit she scored a 25.  She endorses adequate appetite and notes that over the last 3 months she has gained 15 pounds.  Today she denies SI/HI/VH or paranoia.    Patient was referred to [facility] neurology at her last visit.  She notes that prior to medication adjustment she would like to be seen by them.  No medication changes made today.  Patient will continue all medications as prescribed.  No other concerns noted at this time.    Visit Diagnosis:    ICD-10-CM   1. Schizoaffective disorder, bipolar type ([place])  F25.0 busPIRone (BUSPAR) 15 MG tablet    citalopram (CELEXA) 20 MG tablet    divalproex (DEPAKOTE) 500 MG DR tablet    OLANZapine (ZYPREXA) 20 MG tablet    traZODone (DESYREL) 100 MG tablet    2. Generalized anxiety disorder  F41.1 busPIRone (BUSPAR) 15 MG tablet    hydrOXYzine (VISTARIL) 50 MG capsule      Past Psychiatric History: Anxiety, depression, and schizophrenia Past Medical History:  Past Medical History:  Diagnosis Date   Anxiety    Crohn disease ([place])    Depression    Fibroid    Infertility, female     Past Surgical History:  Procedure Laterality Date   COLON SURGERY  2000    due to Chron's age 21.   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  MYOMECTOMY  2019    Family Psychiatric History: Unknown  Family History:  Family History  Problem Relation Age of Onset   Hyperlipidemia Mother    Hypertension Mother    Crohn's disease Father    Crohn's disease Brother    Mental illness Maternal Grandmother    Bone cancer Maternal Grandfather    Mental illness Paternal Grandmother     Social History:  Social History   Socioeconomic History   Marital status: Married    Spouse name: Not on file   Number of children: Not on file   Years of education: Not on file   Highest education level: Not on file  Occupational History   Not on file  Tobacco Use   Smoking status:  Never   Smokeless tobacco: Never  Vaping Use   Vaping Use: Never used  Substance and Sexual Activity   Alcohol use: No   Drug use: No   Sexual activity: Yes    Partners: Male    Birth control/protection: None  Other Topics Concern   Not on file  Social History Narrative   Not on file   Social Determinants of Health   Financial Resource Strain: Not on file  Food Insecurity: Not on file  Transportation Needs: Not on file  Physical Activity: Not on file  Stress: Not on file  Social Connections: Not on file    Allergies: Not on File  Metabolic Disorder Labs: Lab Results  Component Value Date   HGBA1C 5.4 08/01/2018   Lab Results  Component Value Date   PROLACTIN 171.3 (H) 05/19/2016   PROLACTIN 159.4 (H) 01/07/2016   Lab Results  Component Value Date   CHOL 196 05/29/2018   TRIG 177 (H) 05/29/2018   HDL 39 (L) 05/29/2018   CHOLHDL 5.0 (H) 05/29/2018   VLDL 19 05/19/2016   LDLCALC 122 (H) 05/29/2018   LDLCALC 129 (H) 05/19/2016   Lab Results  Component Value Date   TSH 2.384 05/19/2016   TSH 2.461 01/07/2016    Therapeutic Level Labs: No results found for: LITHIUM No results found for: VALPROATE No components found for:  CBMZ  Current Medications: Current Outpatient Medications  Medication Sig Dispense Refill   busPIRone (BUSPAR) 15 MG tablet Take 1 tablet (15 mg total) by mouth 3 (three) times daily. 90 tablet 3   citalopram (CELEXA) 20 MG tablet Take 1 tablet (20 mg total) by mouth daily. 30 tablet 3   divalproex (DEPAKOTE) 500 MG DR tablet Take 3 tablets (1,500 mg total) by mouth at bedtime. 90 tablet 3   HYDROcodone-acetaminophen (NORCO/VICODIN) 5-325 MG tablet Take 1-2 tablets by mouth every 6 (six) hours as needed for severe pain. 12 tablet 0   hydrOXYzine (VISTARIL) 50 MG capsule Take 1 capsule (50 mg total) by mouth every 8 (eight) hours as needed for anxiety. 90 capsule 3   Multiple Vitamins-Minerals (MULTIVITAMIN PO) Take by mouth.      OLANZapine (ZYPREXA) 20 MG tablet Take 1 tablet (20 mg total) by mouth at bedtime. 30 tablet 3   ondansetron (ZOFRAN) 4 MG tablet Take 1 tablet (4 mg total) by mouth every 6 (six) hours. 12 tablet 0   traZODone (DESYREL) 100 MG tablet Take 2 tablets (200 mg total) by mouth at bedtime. 60 tablet 3   No current facility-administered medications for this visit.     Musculoskeletal: Strength & Muscle Tone:  Unable to assess due to telehealth visit Gait & Station:  Unable to assess due to televisit  visit.  Patient leans: N/A  Psychiatric Specialty Exam: Review of Systems  There were no vitals taken for this visit.There is no height or weight on file to calculate BMI.  General Appearance: Well Groomed  Eye Contact:  Good  Speech:  Clear and Coherent and Normal Rate  Volume:  Normal  Mood:  Anxious and Depressed  Affect:  Congruent  Thought Process:  Coherent, Goal Directed and Linear  Orientation:  Full (Time, Place, and Person)  Thought Content: Logical and Hallucinations: Auditory   Suicidal Thoughts:  No  Homicidal Thoughts:  No  Memory:  Immediate;   Good Recent;   Good Remote;   Good  Judgement:  Good  Insight:  Good  Psychomotor Activity:  Normal  Concentration:  Concentration: Good and Attention Span: Good  Recall:  Good  Fund of Knowledge: Good  Language: Good  Akathisia:  No  Handed:  Right  AIMS (if indicated): Not done  Assets:  Communication Skills Desire for Improvement Financial Resources/Insurance Housing  ADL's:  Intact  Cognition: WNL  Sleep:  Fair   Screenings: AIMS    Flowsheet Row Admission (Discharged) from 01/06/2016 in [facility] Total Score 0      AUDIT    Flowsheet Row Admission (Discharged) from 05/18/2016 in [place] Admission (Discharged) from 01/06/2016 in [place]  Alcohol Use Disorder Identification Test Final Score (AUDIT) 0 0      GAD-7    Flowsheet  Row Video Visit from 05/20/2021 in [facility] Video Visit from 02/18/2021 in [facility] Video Visit from 11/24/2020 in [facility] Video Visit from 08/26/2020 in [facility] Video Visit from 07/14/2020 in [facility]  Total GAD-7 Score 14 21 19 16 14       PHQ2-9    Flowsheet Row Video Visit from 05/20/2021 in [facility] Video Visit from 02/18/2021 in [facility] Video Visit from 11/24/2020 in [facility] Video Visit from 08/26/2020 in [facility] Video Visit from 07/14/2020 in [place]  PHQ-2 Total Score 4 6 3 5 5   PHQ-9 Total Score 17 25 18 21 19       Flowsheet Row Video Visit from 02/18/2021 in [facility] Video Visit from 11/24/2020 in [place] Error: Q7 should not be populated when Q6 is No No Risk        Assessment and Plan: Patient endorses symptoms of anxiety, depression, and auditory hallucinations. Patient was referred to [facility] neurology at her last visit.  She notes that prior to medication adjustment she would like to be seen by them.  No medication changes made today.  Patient will continue all medications as prescribed  1. Schizoaffective disorder, bipolar type (H[facility])  Continue- busPIRone (BUSPAR) 15 MG tablet; Take 1 tablet (15 mg total) by mouth 3 (three) times daily.  Dispense: 90 tablet; Refill: 3 Continue- citalopram (CELEXA) 20 MG tablet; Take 1 tablet (20 mg total) by mouth daily.  Dispense: 30 tablet; Refill: 3 Continue- divalproex (DEPAKOTE) 500 MG DR tablet; Take 3 tablets (1,500 mg total) by mouth at bedtime.  Dispense: 90 tablet; Refill: 3 Continue- OLANZapine (ZYPREXA) 20 MG tablet; Take 1 tablet (20 mg  total) by mouth at bedtime.  Dispense: 30 tablet; Refill: 3 Continue- traZODone (  DESYREL) 100 MG tablet; Take 2 tablets (200 mg total) by mouth at bedtime.  Dispense: 60 tablet; Refill: 3  2. Generalized anxiety disorder  Continue- busPIRone (BUSPAR) 15 MG tablet; Take 1 tablet (15 mg total) by mouth 3 (three) times daily.  Dispense: 90 tablet; Refill: 3 Continue- hydrOXYzine (VISTARIL) 50 MG capsule; Take 1 capsule (50 mg total) by mouth every 8 (eight) hours as needed for anxiety.  Dispense: 90 capsule; Refill: 3     Follow-up in 3 month    Salley Slaughter, NP 05/20/2021, 2:47 PM

## 2021-07-05 ENCOUNTER — Telehealth (HOSPITAL_COMMUNITY): Payer: Self-pay | Admitting: *Deleted

## 2021-07-06 ENCOUNTER — Other Ambulatory Visit (HOSPITAL_COMMUNITY): Payer: Self-pay | Admitting: Psychiatry

## 2021-07-06 DIAGNOSIS — F250 Schizoaffective disorder, bipolar type: Secondary | ICD-10-CM

## 2021-07-06 MED ORDER — OLANZAPINE 20 MG PO TABS: 20.0000 mg | ORAL_TABLET | Freq: Every day | ORAL | 3 refills | Status: DC

## 2021-07-06 NOTE — Telephone Encounter (Signed)
Medication refilled and sent to preferred pharmacy.

## 2021-08-24 ENCOUNTER — Telehealth (INDEPENDENT_AMBULATORY_CARE_PROVIDER_SITE_OTHER): Payer: No Payment, Other | Admitting: Psychiatry

## 2021-08-24 ENCOUNTER — Encounter (HOSPITAL_COMMUNITY): Payer: Self-pay | Admitting: Psychiatry

## 2021-08-24 DIAGNOSIS — F250 Schizoaffective disorder, bipolar type: Secondary | ICD-10-CM

## 2021-08-24 DIAGNOSIS — F411 Generalized anxiety disorder: Secondary | ICD-10-CM | POA: Diagnosis not present

## 2021-08-24 MED ORDER — CITALOPRAM HYDROBROMIDE 20 MG PO TABS: 20.0000 mg | ORAL_TABLET | Freq: Every day | ORAL | 3 refills | Status: DC

## 2021-08-24 MED ORDER — BUSPIRONE HCL 15 MG PO TABS: 15.0000 mg | ORAL_TABLET | Freq: Three times a day (TID) | ORAL | 3 refills | Status: DC

## 2021-08-24 MED ORDER — HYDROXYZINE PAMOATE 50 MG PO CAPS: 50.0000 mg | ORAL_CAPSULE | Freq: Three times a day (TID) | ORAL | 3 refills | Status: DC | PRN

## 2021-08-24 MED ORDER — DIVALPROEX SODIUM 500 MG PO DR TAB: 1500.0000 mg | DELAYED_RELEASE_TABLET | Freq: Every evening | ORAL | 3 refills | Status: DC

## 2021-08-24 MED ORDER — OLANZAPINE 20 MG PO TABS: 20.0000 mg | ORAL_TABLET | Freq: Every day | ORAL | 3 refills | Status: DC

## 2021-08-24 MED ORDER — TRAZODONE HCL 100 MG PO TABS: 200.0000 mg | ORAL_TABLET | Freq: Every day | ORAL | 3 refills | Status: DC

## 2021-08-24 NOTE — Progress Notes (Addendum)
[place] MD/PA/NP OP Progress Note Virtual Visit via Telephone Note  I connected with Vicki Lee on 08/24/21 at 10:30 AM EST by telephone and verified that I am speaking with the correct person using two identifiers.  Location: Patient: home Provider: home office   I discussed the limitations, risks, security and privacy concerns of performing an evaluation and management service by telephone and the availability of in person appointments. I also discussed with the patient that there may be a patient responsible charge related to this service. The patient expressed understanding and agreed to proceed.   I provided 30 minutes of non-face-to-face time during this encounter.           08/24/2021 10:14 AM Vicki Lee  MRN:  [id number]  Chief Complaint: "I want to be seen by neurology"  HPI: 40 year-old female seen today for follow up psychiatric evaluation. Patient has a psychiatric history of schizophrenia, depression, and anxiety. She is currently managed on Depakote 1500 mg nightly, Buspar 15 mg TID, Trazodone 200 mg nightly, Zyprexa 20 mg nightly, Hydroxyzine 50 mg TID, and Celexa 20 mg daily. Today patient notes that medications are somewhat effective in managing her symptoms.   Today patients visit was done over the phone. During exam she was pleasant, cooperative, and engaged in conversation.  She notes that she continues to suffering from hallucinations and request to be seen by neurology prior to medication adjustment. Provider informed patient that she was referred and she reports that she was told that she needed another referral as hers has expired in 3 months. Patient was recently referred to [facility] neurology on 11/15.  Patient notes that her voices continue to tell her to do things prior to her doing them. She notes that her voices exacerbates her anxiety and depression. Patient requested not to do a GAD 7 or a PHQ 9 today. She does endorse adequate appetite and fair  sleep (4-5 hours). Today she denies SI/HI/AH. She does note that she continues feeling paranoid.   Provider informed patient that if voices does not subside her her antipsychotic could be readjusted. Provider recommended haldol or Clozaril. She endorsed understanding and reports that she would consider adjustments in the future. Patient instructed to follow up with [facility] Neurology.   No medication changes made today.  Patient will continue all medications as prescribed.  No other concerns noted at this time.    Visit Diagnosis:    ICD-10-CM   1. Schizoaffective disorder, bipolar type ([place])  F25.0 busPIRone (BUSPAR) 15 MG tablet    citalopram (CELEXA) 20 MG tablet    divalproex (DEPAKOTE) 500 MG DR tablet    OLANZapine (ZYPREXA) 20 MG tablet    traZODone (DESYREL) 100 MG tablet    CANCELED: Ambulatory referral to Neurology    2. Generalized anxiety disorder  F41.1 busPIRone (BUSPAR) 15 MG tablet    hydrOXYzine (VISTARIL) 50 MG capsule    CANCELED: Ambulatory referral to Neurology      Past Psychiatric History: Anxiety, depression, and schizophrenia Past Medical History:  Past Medical History:  Diagnosis Date   Anxiety    Crohn disease ([place])    Depression    Fibroid    Infertility, female     Past Surgical History:  Procedure Laterality Date   COLON SURGERY  2000    due to Chron's age 71.   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   MYOMECTOMY  2019    Family Psychiatric History: Unknown  Family History:  Family History  Problem Relation Age of Onset   Hyperlipidemia Mother    Hypertension Mother    Crohn's disease Father    Crohn's disease Brother    Mental illness Maternal Grandmother    Bone cancer Maternal Grandfather    Mental illness Paternal Grandmother     Social History:  Social History   Socioeconomic History   Marital status: Married    Spouse name: Not on file   Number of children: Not on file   Years of education: Not on file   Highest  education level: Not on file  Occupational History   Not on file  Tobacco Use   Smoking status: Never   Smokeless tobacco: Never  Vaping Use   Vaping Use: Never used  Substance and Sexual Activity   Alcohol use: No   Drug use: No   Sexual activity: Yes    Partners: Male    Birth control/protection: None  Other Topics Concern   Not on file  Social History Narrative   Not on file   Social Determinants of Health   Financial Resource Strain: Not on file  Food Insecurity: Not on file  Transportation Needs: Not on file  Physical Activity: Not on file  Stress: Not on file  Social Connections: Not on file    Allergies: Not on File  Metabolic Disorder Labs: Lab Results  Component Value Date   HGBA1C 5.4 08/01/2018   Lab Results  Component Value Date   PROLACTIN 171.3 (H) 05/19/2016   PROLACTIN 159.4 (H) 01/07/2016   Lab Results  Component Value Date   CHOL 196 05/29/2018   TRIG 177 (H) 05/29/2018   HDL 39 (L) 05/29/2018   CHOLHDL 5.0 (H) 05/29/2018   VLDL 19 05/19/2016   LDLCALC 122 (H) 05/29/2018   LDLCALC 129 (H) 05/19/2016   Lab Results  Component Value Date   TSH 2.384 05/19/2016   TSH 2.461 01/07/2016    Therapeutic Level Labs: No results found for: LITHIUM No results found for: VALPROATE No components found for:  CBMZ  Current Medications: Current Outpatient Medications  Medication Sig Dispense Refill   busPIRone (BUSPAR) 15 MG tablet Take 1 tablet (15 mg total) by mouth 3 (three) times daily. 90 tablet 3   citalopram (CELEXA) 20 MG tablet Take 1 tablet (20 mg total) by mouth daily. 30 tablet 3   divalproex (DEPAKOTE) 500 MG DR tablet Take 3 tablets (1,500 mg total) by mouth at bedtime. 90 tablet 3   HYDROcodone-acetaminophen (NORCO/VICODIN) 5-325 MG tablet Take 1-2 tablets by mouth every 6 (six) hours as needed for severe pain. 12 tablet 0   hydrOXYzine (VISTARIL) 50 MG capsule Take 1 capsule (50 mg total) by mouth every 8 (eight) hours as needed for  anxiety. 90 capsule 3   Multiple Vitamins-Minerals (MULTIVITAMIN PO) Take by mouth.     OLANZapine (ZYPREXA) 20 MG tablet Take 1 tablet (20 mg total) by mouth at bedtime. 30 tablet 3   ondansetron (ZOFRAN) 4 MG tablet Take 1 tablet (4 mg total) by mouth every 6 (six) hours. 12 tablet 0   traZODone (DESYREL) 100 MG tablet Take 2 tablets (200 mg total) by mouth at bedtime. 60 tablet 3   No current facility-administered medications for this visit.     Musculoskeletal: Strength & Muscle Tone:  Unable to assess due to telephone visit Gait & Station:  Unable to assess due to telephone visit.  Patient leans: N/A  Psychiatric Specialty Exam: Review of  Systems  There were no vitals taken for this visit.There is no height or weight on file to calculate BMI.  General Appearance:  Unable to assess due to telephone visit.  Eye Contact:   Unable to assess due to telephone visit.  Speech:  Clear and Coherent and Normal Rate  Volume:  Normal  Mood:  Anxious and Depressed  Affect:  Congruent  Thought Process:  Coherent, Goal Directed and Linear  Orientation:  Full (Time, Place, and Person)  Thought Content: Logical and Hallucinations: Auditory   Suicidal Thoughts:  No  Homicidal Thoughts:  No  Memory:  Immediate;   Good Recent;   Good Remote;   Good  Judgement:  Good  Insight:  Good  Psychomotor Activity:   Unable to assess due to telephone visit.  Concentration:  Concentration: Good and Attention Span: Good  Recall:  Good  Fund of Knowledge: Good  Language: Good  Akathisia:   Unable to assess due to telephone visit.  Handed:  Right  AIMS (if indicated): Not done  Assets:  Communication Skills Desire for Improvement Financial Resources/Insurance Housing  ADL's:  Intact  Cognition: WNL  Sleep:  Fair   Screenings: AIMS    Flowsheet Row Admission (Discharged) from 01/06/2016 in [place] Total Score 0      AUDIT    Flowsheet Row Admission  (Discharged) from 05/18/2016 in [place] Admission (Discharged) from 01/06/2016 in [place]  Alcohol Use Disorder Identification Test Final Score (AUDIT) 0 0      GAD-7    Flowsheet Row Video Visit from 05/20/2021 in [facility] Video Visit from 02/18/2021 in [facility] Video Visit from 11/24/2020 in [facility] Video Visit from 08/26/2020 in [facility] Video Visit from 07/14/2020 in [facility]  Total GAD-7 Score 14 21 19 16 14       PHQ2-9    Flowsheet Row Video Visit from 05/20/2021 in [facility] Video Visit from 02/18/2021 in [facility] Video Visit from 11/24/2020 in [facility] Video Visit from 08/26/2020 in [facility] Video Visit from 07/14/2020 in [place]  PHQ-2 Total Score 4 6 3 5 5   PHQ-9 Total Score 17 25 18 21 19       Flowsheet Row Video Visit from 02/18/2021 in [facility] Video Visit from 11/24/2020 in [place] Error: Q7 should not be populated when Q6 is No No Risk        Assessment and Plan: Patient endorses symptoms of anxiety, depression, and auditory hallucinations. Patient was referred to [facility] Neurology on 11/15.  Provider informed patient that if voices does not subside her her antipsychotic could be readjusted. Provider recommended haldol or Clozaril. She endorsed understanding and reports that she would consider adjustments in the future. Patient instructed to follow up with [facility] Neurology.   No medication changes made today.  Patient will continue all medications as prescribed.  1. Schizoaffective disorder, bipolar type ([place])  Continue-  busPIRone (BUSPAR) 15 MG tablet; Take 1 tablet (15 mg total) by mouth 3 (three) times daily.  Dispense: 90 tablet; Refill: 3 Continue- citalopram (CELEXA) 20 MG tablet; Take 1 tablet (20 mg total) by mouth daily.  Dispense: 30 tablet; Refill: 3 Continue- divalproex (DEPAKOTE) 500  MG DR tablet; Take 3 tablets (1,500 mg total) by mouth at bedtime.  Dispense: 90 tablet; Refill: 3 Continue- OLANZapine (ZYPREXA) 20 MG tablet; Take 1 tablet (20 mg total) by mouth at bedtime.  Dispense: 30 tablet; Refill: 3 Continue- traZODone (DESYREL) 100 MG tablet; Take 2 tablets (200 mg total) by mouth at bedtime.  Dispense: 60 tablet; Refill: 3  2. Generalized anxiety disorder  Continue- busPIRone (BUSPAR) 15 MG tablet; Take 1 tablet (15 mg total) by mouth 3 (three) times daily.  Dispense: 90 tablet; Refill: 3 Continue- hydrOXYzine (VISTARIL) 50 MG capsule; Take 1 capsule (50 mg total) by mouth every 8 (eight) hours as needed for anxiety.  Dispense: 90 capsule; Refill: 3     Follow-up in 3 month    Salley Slaughter, NP 08/24/2021, 10:14 AM

## 2021-08-25 ENCOUNTER — Telehealth (HOSPITAL_COMMUNITY): Payer: No Payment, Other | Admitting: Psychiatry

## 2021-11-11 ENCOUNTER — Telehealth (INDEPENDENT_AMBULATORY_CARE_PROVIDER_SITE_OTHER): Payer: No Payment, Other | Admitting: Psychiatry

## 2021-11-11 DIAGNOSIS — F250 Schizoaffective disorder, bipolar type: Secondary | ICD-10-CM

## 2021-11-11 DIAGNOSIS — F411 Generalized anxiety disorder: Secondary | ICD-10-CM

## 2021-11-11 DIAGNOSIS — F3341 Major depressive disorder, recurrent, in partial remission: Secondary | ICD-10-CM

## 2021-11-11 MED ORDER — TRAZODONE HCL 100 MG PO TABS
200.0000 mg | ORAL_TABLET | Freq: Every day | ORAL | 2 refills | Status: DC
Start: 2021-11-11 — End: 2022-02-16

## 2021-11-11 MED ORDER — OLANZAPINE 20 MG PO TABS: 20.0000 mg | ORAL_TABLET | Freq: Every day | ORAL | 2 refills | Status: DC

## 2021-11-11 MED ORDER — CITALOPRAM HYDROBROMIDE 20 MG PO TABS: 20.0000 mg | ORAL_TABLET | Freq: Every day | ORAL | 2 refills | Status: DC

## 2021-11-11 MED ORDER — BUSPIRONE HCL 15 MG PO TABS: 15.0000 mg | ORAL_TABLET | Freq: Three times a day (TID) | ORAL | 2 refills | Status: DC

## 2021-11-11 MED ORDER — HYDROXYZINE PAMOATE 50 MG PO CAPS: 50.0000 mg | ORAL_CAPSULE | Freq: Three times a day (TID) | ORAL | 2 refills | Status: DC | PRN

## 2021-11-11 NOTE — Progress Notes (Signed)
[facility] MD/PA/NP OP Progress Note  11/11/2021 3:12 PM Vicki Lee  MRN:  [id number] Virtual Visit via Telephone Note  I connected with Vicki Lee on 11/11/21 at  2:30 PM EST by telephone and verified that I am speaking with the correct person using two identifiers.  Location: Patient: Home Provider: OffSite   I discussed the limitations, risks, security and privacy concerns of performing an evaluation and management service by telephone and the availability of in person appointments. I also discussed with the patient that there may be a patient responsible charge related to this service. The patient expressed understanding and agreed to proceed.    I discussed the assessment and treatment plan with the patient. The patient was provided an opportunity to ask questions and all were answered. The patient agreed with the plan and demonstrated an understanding of the instructions.   The patient was advised to call back or seek an in-person evaluation if the symptoms worsen or if the condition fails to improve as anticipated.  I provided 25 minutes of non-face-to-face time during this encounter.   Franne Grip, NP   Chief Complaint: Medication management  HPI: Vicki Lee is a 41 year old female presenting to [facility] behavioral health outpatient for a follow-up psychiatric evaluation.  She has a psychiatric history of generalized anxiety disorder, schizoaffective disorder, bipolar type and major depressive disorder.  Patient symptoms are managed with BuSpar 15 mg 3 times daily, Celexa 20 mg daily, Depakote 15 mg nightly, hydroxyzine 50 mg every 8 hours as needed for anxiety, olanzapine 20 mg at bedtime, and trazodone 100 mg at bedtime.  Patient reports satisfaction with medication regimen and believes medications are effective.  Patient does report auditory hallucinations of a female voice telling her what to do before she does it.  Patient also reports weight gain.  Medication  education provided for Latuda which is less likely to cause weight gain.  Patient reports that she was previously recommended Latuda but it was cost ineffective.  Seroquel offered as a comparable antipsychotic that may manage symptoms better.  Patient is not in agreement with switching from olanzapine at this time.  She reports visiting a neurologist but the visit was unsuccessful because they could not help her with her auditory hallucinations.  Patient is alert and oriented x4, calm, pleasant and willing to engage.  She reports an okay mood but feeling drained due to auditory hallucinations.  She reports good sleep and appetite.  Patient denies suicidal or homicidal ideations, paranoia, delusions, or visual hallucinations.  Visit Diagnosis:    ICD-10-CM   1. Schizoaffective disorder, bipolar type ([place])  F25.0 busPIRone (BUSPAR) 15 MG tablet    citalopram (CELEXA) 20 MG tablet    OLANZapine (ZYPREXA) 20 MG tablet    traZODone (DESYREL) 100 MG tablet    2. Generalized anxiety disorder  F41.1 busPIRone (BUSPAR) 15 MG tablet    hydrOXYzine (VISTARIL) 50 MG capsule    3. Recurrent major depressive disorder, in partial remission ([place])  F33.41       Past Psychiatric History: Schizoaffective disorder, bipolar type, generalized anxiety disorder, major depressive disorder  Past Medical History:  Past Medical History:  Diagnosis Date   Anxiety    Crohn disease ([place])    Depression    Fibroid    Infertility, female     Past Surgical History:  Procedure Laterality Date   COLON SURGERY  2000    due to Chron's age 22.   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   MYOMECTOMY  2019    Family Psychiatric History: See below  Family History:  Family History  Problem Relation Age of Onset   Hyperlipidemia Mother    Hypertension Mother    Crohn's disease Father    Crohn's disease Brother    Mental illness Maternal Grandmother    Bone cancer Maternal Grandfather    Mental illness Paternal  Grandmother     Social History:  Social History   Socioeconomic History   Marital status: Married    Spouse name: Not on file   Number of children: Not on file   Years of education: Not on file   Highest education level: Not on file  Occupational History   Not on file  Tobacco Use   Smoking status: Never   Smokeless tobacco: Never  Vaping Use   Vaping Use: Never used  Substance and Sexual Activity   Alcohol use: No   Drug use: No   Sexual activity: Yes    Partners: Male    Birth control/protection: None  Other Topics Concern   Not on file  Social History Narrative   Not on file   Social Determinants of Health   Financial Resource Strain: Not on file  Food Insecurity: Not on file  Transportation Needs: Not on file  Physical Activity: Not on file  Stress: Not on file  Social Connections: Not on file    Allergies: Not on File  Metabolic Disorder Labs: Lab Results  Component Value Date   HGBA1C 5.4 08/01/2018   Lab Results  Component Value Date   PROLACTIN 171.3 (H) 05/19/2016   PROLACTIN 159.4 (H) 01/07/2016   Lab Results  Component Value Date   CHOL 196 05/29/2018   TRIG 177 (H) 05/29/2018   HDL 39 (L) 05/29/2018   CHOLHDL 5.0 (H) 05/29/2018   VLDL 19 05/19/2016   LDLCALC 122 (H) 05/29/2018   LDLCALC 129 (H) 05/19/2016   Lab Results  Component Value Date   TSH 2.384 05/19/2016   TSH 2.461 01/07/2016    Therapeutic Level Labs: No results found for: LITHIUM No results found for: VALPROATE No components found for:  CBMZ  Current Medications: Current Outpatient Medications  Medication Sig Dispense Refill   busPIRone (BUSPAR) 15 MG tablet Take 1 tablet (15 mg total) by mouth 3 (three) times daily. 90 tablet 2   citalopram (CELEXA) 20 MG tablet Take 1 tablet (20 mg total) by mouth daily. 30 tablet 2   divalproex (DEPAKOTE) 500 MG DR tablet Take 3 tablets (1,500 mg total) by mouth at bedtime. 90 tablet 3   HYDROcodone-acetaminophen (NORCO/VICODIN)  5-325 MG tablet Take 1-2 tablets by mouth every 6 (six) hours as needed for severe pain. 12 tablet 0   hydrOXYzine (VISTARIL) 50 MG capsule Take 1 capsule (50 mg total) by mouth every 8 (eight) hours as needed for anxiety. 90 capsule 2   Multiple Vitamins-Minerals (MULTIVITAMIN PO) Take by mouth.     OLANZapine (ZYPREXA) 20 MG tablet Take 1 tablet (20 mg total) by mouth at bedtime. 30 tablet 2   ondansetron (ZOFRAN) 4 MG tablet Take 1 tablet (4 mg total) by mouth every 6 (six) hours. 12 tablet 0   traZODone (DESYREL) 100 MG tablet Take 2 tablets (200 mg total) by mouth at bedtime. 60 tablet 2   No current facility-administered medications for this visit.     Musculoskeletal: Strength & Muscle Tone: N/A Gait & Station: N/A Patient leans: N/A  Psychiatric Specialty Exam:  Review of Systems  Psychiatric/Behavioral:  Positive for hallucinations. Negative for self-injury and suicidal ideas.   All other systems reviewed and are negative.  There were no vitals taken for this visit.There is no height or weight on file to calculate BMI.  General Appearance: N/A  Eye Contact: N/A  Speech: Good  Volume: Normal  Mood: Euthymic  Affect: N/A  Thought Process: Goal directed  Orientation:  Full (Time, Place, and Person)  Thought Content: Logical  Suicidal Thoughts: No  Homicidal Thoughts: No  Memory: Good  Judgement: Good  Insight: Good  Psychomotor Activity: N/A  Concentration: Good  Recall: Good  Fund of Knowledge: Good  Language: Good  Akathisia: N/A  Handed: Right  AIMS (if indicated): Not done  Assets:  Communication Skills Desire for Improvement  ADL's:  Intact  Cognition: WNL  Sleep:  Fair   Screenings: AIMS    Flowsheet Row Admission (Discharged) from 01/06/2016 in [place] Total Score 0      AUDIT    Flowsheet Row Admission (Discharged) from 05/18/2016 in [place] Admission (Discharged) from 01/06/2016 in [place]  Alcohol Use Disorder Identification Test Final Score (AUDIT) 0 0      GAD-7    Flowsheet Row Video Visit from 05/20/2021 in [facility] Video Visit from 02/18/2021 in [facility] Video Visit from 11/24/2020 in [facility] Video Visit from 08/26/2020 in [facility] Video Visit from 07/14/2020 in [facility]  Total GAD-7 Score 14 21 19 16 14       PHQ2-9    Flowsheet Row Video Visit from 05/20/2021 in [facility] Video Visit from 02/18/2021 in [facility] Video Visit from 11/24/2020 in [facility] Video Visit from 08/26/2020 in [facility] Video Visit from 07/14/2020 in [place]  PHQ-2 Total Score 4 6 3 5 5   PHQ-9 Total Score 17 25 18 21 19       Flowsheet Row Video Visit from 02/18/2021 in [facility] Video Visit from 11/24/2020 in [place] Error: Q7 should not be populated when Q6 is No No Risk        Assessment and Plan:  Vicki Lee is a 41 year old female presenting to [facility] behavioral health outpatient for a follow-up psychiatric evaluation.  She has a psychiatric history of generalized anxiety disorder, schizoaffective disorder, bipolar type and major depressive disorder.  Patient symptoms are managed with BuSpar 15 mg 3 times daily, Celexa 20 mg daily, Depakote 15 mg nightly, hydroxyzine 50 mg every 8 hours as needed for anxiety, olanzapine 20 mg at bedtime, and trazodone 100 mg at bedtime.  Patient reports satisfaction with medication regimen and believes medications are effective.  Patient does report auditory hallucinations of a female voice telling her what to do before she does it.   Patient also reports weight gain.  Medication education provided for Latuda which is less likely to cause weight gain.  Patient reports that she was previously recommended Latuda but it was cost ineffective.  Seroquel offered as a comparable antipsychotic that may manage symptoms better.  Patient is not in agreement with switching from olanzapine at this time.  No medication changes at this time.  Medications refilled.  Collaboration of Care: Collaboration of Care: Medication Management  AEB medications E scribed to patient's preferred pharmacy  1. Schizoaffective disorder, bipolar type (HCC) - OLANZapine (ZYPREXA) 20 MG tablet; Take 1 tablet (20 mg total) by mouth at bedtime.  Dispense: 30 tablet; Refill: 2 Continue Depakote 1500 mg nightly 2. Generalized anxiety disorder  - busPIRone (BUSPAR) 15 MG tablet; Take 1 tablet (15 mg total) by mouth 3 (three) times daily.  Dispense: 90 tablet; Refill: 2 - hydrOXYzine (VISTARIL) 50 MG capsule; Take 1 capsule (50 mg total) by mouth every 8 (eight) hours as needed for anxiety.  Dispense: 90 capsule; Refill: 2  3. Recurrent major depressive disorder, in partial remission (HCC)  - citalopram (CELEXA) 20 MG tablet; Take 1 tablet (20 mg total) by mouth daily.  Dispense: 30 tablet; Refill: 2 - traZODone (DESYREL) 100 MG tablet; Take 2 tablets (200 mg total) by mouth at bedtime.  Dispense: 60 tablet; Refill: 2  Return to care in 3 months  Patient/Guardian was advised Release of Information must be obtained prior to any record release in order to collaborate their care with an outside provider. Patient/Guardian was advised if they have not already done so to contact the registration department to sign all necessary forms in order for [place] to release information regarding their care.   Consent: Patient/Guardian gives verbal consent for treatment and assignment of benefits for services provided during this visit. Patient/Guardian expressed understanding and agreed  to proceed.    Franne Grip, NP 11/11/2021, 3:12 PM

## 2022-02-10 ENCOUNTER — Telehealth (HOSPITAL_COMMUNITY): Payer: No Payment, Other | Admitting: Psychiatry

## 2022-02-16 ENCOUNTER — Telehealth (INDEPENDENT_AMBULATORY_CARE_PROVIDER_SITE_OTHER): Payer: No Payment, Other | Admitting: Psychiatry

## 2022-02-16 ENCOUNTER — Encounter (HOSPITAL_COMMUNITY): Payer: Self-pay | Admitting: Psychiatry

## 2022-02-16 DIAGNOSIS — F250 Schizoaffective disorder, bipolar type: Secondary | ICD-10-CM | POA: Diagnosis not present

## 2022-02-16 DIAGNOSIS — F411 Generalized anxiety disorder: Secondary | ICD-10-CM | POA: Diagnosis not present

## 2022-02-16 MED ORDER — CITALOPRAM HYDROBROMIDE 20 MG PO TABS: 20.0000 mg | ORAL_TABLET | Freq: Every day | ORAL | 3 refills | Status: DC

## 2022-02-16 MED ORDER — TRAZODONE HCL 100 MG PO TABS: 200.0000 mg | ORAL_TABLET | Freq: Every day | ORAL | 3 refills | Status: DC

## 2022-02-16 MED ORDER — HYDROXYZINE PAMOATE 50 MG PO CAPS: 50.0000 mg | ORAL_CAPSULE | Freq: Four times a day (QID) | ORAL | 2 refills | Status: DC | PRN

## 2022-02-16 MED ORDER — DIVALPROEX SODIUM 500 MG PO DR TAB: 1500.0000 mg | DELAYED_RELEASE_TABLET | Freq: Every evening | ORAL | 3 refills | Status: DC

## 2022-02-16 MED ORDER — OLANZAPINE 20 MG PO TABS: 20.0000 mg | ORAL_TABLET | Freq: Every day | ORAL | 3 refills | Status: DC

## 2022-02-16 MED ORDER — BUSPIRONE HCL 15 MG PO TABS: 15.0000 mg | ORAL_TABLET | Freq: Three times a day (TID) | ORAL | 3 refills | Status: DC

## 2022-02-16 NOTE — Progress Notes (Signed)
[facility] MD/PA/NP OP Progress Note Virtual Visit via Telephone Note  I connected with Vicki Lee on 02/16/22 at  1:30 PM EDT by telephone and verified that I am speaking with the correct person using two identifiers.  Location: Patient: home Provider: home office   I discussed the limitations, risks, security and privacy concerns of performing an evaluation and management service by telephone and the availability of in person appointments. I also discussed with the patient that there may be a patient responsible charge related to this service. The patient expressed understanding and agreed to proceed.   I provided 30 minutes of non-face-to-face time during this encounter.           02/16/2022 1:19 PM Vicki Lee  MRN:  [id number]  Chief Complaint: "I'm still hearing the voices and Im so tired of them"  HPI: 41 year-old female seen today for follow up psychiatric evaluation. Patient has a psychiatric history of schizophrenia, depression, and anxiety. She is currently managed on Depakote 1500 mg nightly, Buspar 15 mg TID, Trazodone 200 mg nightly, Zyprexa 20 mg nightly, Hydroxyzine 50 mg TID, and Celexa 20 mg daily. Today patient notes that medications are somewhat effective in managing her symptoms.   Today patients visit was done over the phone. During exam she was pleasant, cooperative, and engaged in conversation.  She notes that she continues to hear voices which instructed to do things before she does them.  She notes that she is tired of her voices but notes that she is not willing to change medications.  She reports that she is skeptical because she dislikes the side effects.  Provider informed her that if other medications were trialed her hallucinations may subside.  She endorsed understanding however notes that at this time she does not want to change medications.  Provider endorsed understanding and agreed.  She reports that she was seen by neurology however notes that they  did not check her for organic causes for her hallucinations and notes that this was disappointing.    Patiently patient notes that she has been more anxious.  She says she is nervous about driving in the car.  Today provider conducted a GAD-7 and patient scored a 15.  Provider also conducted PHQ-9 patient go to 14.  She notes that she continues to sleep 5 hours nightly.  Today she denies SI/HI/VH or mania.  At times she notes that she feels paranoid.    Provider informed patient that if voices does not subside her her antipsychotic could be readjusted. Provider recommended haldol or Clozaril. She endorsed understanding and reports that she would consider adjustments in the future.  Today she was agreeable to taking hydroxyzine 4 times daily as needed instead of 3 times daily to help manage anxiety.  She will continue all other medications as prescribed.  No other concerns noted at this time.    Visit Diagnosis:  No diagnosis found.   Past Psychiatric History: Anxiety, depression, and schizophrenia Past Medical History:  Past Medical History:  Diagnosis Date   Anxiety    Crohn disease ([place])    Depression    Fibroid    Infertility, female     Past Surgical History:  Procedure Laterality Date   COLON SURGERY  2000    due to Chron's age 33.   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   MYOMECTOMY  2019    Family Psychiatric History: Unknown  Family History:  Family History  Problem Relation Age  of Onset   Hyperlipidemia Mother    Hypertension Mother    Crohn's disease Father    Crohn's disease Brother    Mental illness Maternal Grandmother    Bone cancer Maternal Grandfather    Mental illness Paternal Grandmother     Social History:  Social History   Socioeconomic History   Marital status: Married    Spouse name: Not on file   Number of children: Not on file   Years of education: Not on file   Highest education level: Not on file  Occupational History   Not on file   Tobacco Use   Smoking status: Never   Smokeless tobacco: Never  Vaping Use   Vaping Use: Never used  Substance and Sexual Activity   Alcohol use: No   Drug use: No   Sexual activity: Yes    Partners: Male    Birth control/protection: None  Other Topics Concern   Not on file  Social History Narrative   Not on file   Social Determinants of Health   Financial Resource Strain: Not on file  Food Insecurity: Not on file  Transportation Needs: Not on file  Physical Activity: Not on file  Stress: Not on file  Social Connections: Not on file    Allergies: Not on File  Metabolic Disorder Labs: Lab Results  Component Value Date   HGBA1C 5.4 08/01/2018   Lab Results  Component Value Date   PROLACTIN 171.3 (H) 05/19/2016   PROLACTIN 159.4 (H) 01/07/2016   Lab Results  Component Value Date   CHOL 196 05/29/2018   TRIG 177 (H) 05/29/2018   HDL 39 (L) 05/29/2018   CHOLHDL 5.0 (H) 05/29/2018   VLDL 19 05/19/2016   LDLCALC 122 (H) 05/29/2018   LDLCALC 129 (H) 05/19/2016   Lab Results  Component Value Date   TSH 2.384 05/19/2016   TSH 2.461 01/07/2016    Therapeutic Level Labs: No results found for: LITHIUM No results found for: VALPROATE No components found for:  CBMZ  Current Medications: Current Outpatient Medications  Medication Sig Dispense Refill   busPIRone (BUSPAR) 15 MG tablet Take 1 tablet (15 mg total) by mouth 3 (three) times daily. 90 tablet 2   citalopram (CELEXA) 20 MG tablet Take 1 tablet (20 mg total) by mouth daily. 30 tablet 2   divalproex (DEPAKOTE) 500 MG DR tablet Take 3 tablets (1,500 mg total) by mouth at bedtime. 90 tablet 3   HYDROcodone-acetaminophen (NORCO/VICODIN) 5-325 MG tablet Take 1-2 tablets by mouth every 6 (six) hours as needed for severe pain. 12 tablet 0   hydrOXYzine (VISTARIL) 50 MG capsule Take 1 capsule (50 mg total) by mouth every 8 (eight) hours as needed for anxiety. 90 capsule 2   Multiple Vitamins-Minerals (MULTIVITAMIN  PO) Take by mouth.     OLANZapine (ZYPREXA) 20 MG tablet Take 1 tablet (20 mg total) by mouth at bedtime. 30 tablet 2   ondansetron (ZOFRAN) 4 MG tablet Take 1 tablet (4 mg total) by mouth every 6 (six) hours. 12 tablet 0   traZODone (DESYREL) 100 MG tablet Take 2 tablets (200 mg total) by mouth at bedtime. 60 tablet 2   No current facility-administered medications for this visit.     Musculoskeletal: Strength & Muscle Tone:  Unable to assess due to telephone visit Gait & Station:  Unable to assess due to telephone visit.  Patient leans: N/A  Psychiatric Specialty Exam: Review of Systems  There were no vitals taken for this  visit.There is no height or weight on file to calculate BMI.  General Appearance:  Unable to assess due to telephone visit.  Eye Contact:   Unable to assess due to telephone visit.  Speech:  Clear and Coherent and Normal Rate  Volume:  Normal  Mood:  Anxious and Depressed  Affect:  Congruent  Thought Process:  Coherent, Goal Directed and Linear  Orientation:  Full (Time, Place, and Person)  Thought Content: Logical and Hallucinations: Auditory   Suicidal Thoughts:  No  Homicidal Thoughts:  No  Memory:  Immediate;   Good Recent;   Good Remote;   Good  Judgement:  Good  Insight:  Good  Psychomotor Activity:   Unable to assess due to telephone visit.  Concentration:  Concentration: Good and Attention Span: Good  Recall:  Good  Fund of Knowledge: Good  Language: Good  Akathisia:   Unable to assess due to telephone visit.  Handed:  Right  AIMS (if indicated): Not done  Assets:  Communication Skills Desire for Improvement Financial Resources/Insurance Housing  ADL's:  Intact  Cognition: WNL  Sleep:  Fair   Screenings: AIMS    Flowsheet Row Admission (Discharged) from 01/06/2016 in [place] Total Score 0      AUDIT    Flowsheet Row Admission (Discharged) from 05/18/2016 in [place] Admission  (Discharged) from 01/06/2016 in [place]  Alcohol Use Disorder Identification Test Final Score (AUDIT) 0 0      GAD-7    Flowsheet Row Video Visit from 05/20/2021 in [facility] Video Visit from 02/18/2021 in [facility] Video Visit from 11/24/2020 in [facility] Video Visit from 08/26/2020 in [facility] Video Visit from 07/14/2020 in [facility]  Total GAD-7 Score 14 21 19 16 14       PHQ2-9    Flowsheet Row Video Visit from 05/20/2021 in [facility] Video Visit from 02/18/2021 in [facility] Video Visit from 11/24/2020 in [facility] Video Visit from 08/26/2020 in [facility] Video Visit from 07/14/2020 in [place]  PHQ-2 Total Score 4 6 3 5 5   PHQ-9 Total Score 17 25 18 21 19       Flowsheet Row Video Visit from 02/18/2021 in [facility] Video Visit from 11/24/2020 in [place] Error: Q7 should not be populated when Q6 is No No Risk        Assessment and Plan: Patient endorses symptoms of anxiety, depression, and auditory hallucinations.  Patient notes that at this time she is not interested in trialing other antipsychotics as she is skeptical about side effects.  endorsed understanding and informed patient that if she wants to try other antipsychotics in the future Clozaril or Haldol may be a good option.  She endorsed understanding and agreed.  Today she was agreeable to increasing hydroxyzine 50 mg 3 times daily to 50 mg 4 times daily.  She will continue her other medication as prescribed. 1. Schizoaffective disorder, bipolar type ([place])  Continue- busPIRone (BUSPAR) 15 MG tablet; Take 1 tablet (15 mg  total) by mouth 3 (three) times daily.  Dispense: 90 tablet; Refill: 3 Continue- citalopram (CELEXA) 20 MG tablet; Take 1 tablet (20 mg total) by mouth daily.  Dispense: 30 tablet; Refill: 3  Continue- divalproex (DEPAKOTE) 500 MG DR tablet; Take 3 tablets (1,500 mg total) by mouth at bedtime.  Dispense: 90 tablet; Refill: 3 Continue- OLANZapine (ZYPREXA) 20 MG tablet; Take 1 tablet (20 mg total) by mouth at bedtime.  Dispense: 30 tablet; Refill: 3 Continue- traZODone (DESYREL) 100 MG tablet; Take 2 tablets (200 mg total) by mouth at bedtime.  Dispense: 60 tablet; Refill: 3  2. Generalized anxiety disorder  Continue- busPIRone (BUSPAR) 15 MG tablet; Take 1 tablet (15 mg total) by mouth 3 (three) times daily.  Dispense: 90 tablet; Refill: 3 Increased- hydrOXYzine (VISTARIL) 50 MG capsule; Take 1 capsule (50 mg total) by mouth every 6 (six) hours as needed for anxiety.  Dispense: 120 capsule; Refill: 2      Follow-up in 3 month    Salley Slaughter, NP 02/16/2022, 1:19 PM

## 2022-05-19 ENCOUNTER — Encounter (HOSPITAL_COMMUNITY): Payer: Self-pay | Admitting: Psychiatry

## 2022-05-19 ENCOUNTER — Telehealth (INDEPENDENT_AMBULATORY_CARE_PROVIDER_SITE_OTHER): Payer: No Payment, Other | Admitting: Psychiatry

## 2022-05-19 DIAGNOSIS — F411 Generalized anxiety disorder: Secondary | ICD-10-CM | POA: Diagnosis not present

## 2022-05-19 DIAGNOSIS — F250 Schizoaffective disorder, bipolar type: Secondary | ICD-10-CM

## 2022-05-19 MED ORDER — BUSPIRONE HCL 15 MG PO TABS: 15.0000 mg | ORAL_TABLET | Freq: Three times a day (TID) | ORAL | 3 refills | Status: DC

## 2022-05-19 MED ORDER — OLANZAPINE 20 MG PO TABS: 20.0000 mg | ORAL_TABLET | Freq: Every day | ORAL | 3 refills | Status: DC

## 2022-05-19 MED ORDER — DIVALPROEX SODIUM 500 MG PO DR TAB: 1500.0000 mg | DELAYED_RELEASE_TABLET | Freq: Every evening | ORAL | 3 refills | Status: DC

## 2022-05-19 MED ORDER — TRAZODONE HCL 100 MG PO TABS: 200.0000 mg | ORAL_TABLET | Freq: Every day | ORAL | 3 refills | Status: DC

## 2022-05-19 MED ORDER — HYDROXYZINE PAMOATE 50 MG PO CAPS: 50.0000 mg | ORAL_CAPSULE | Freq: Four times a day (QID) | ORAL | 3 refills | Status: DC | PRN

## 2022-05-19 MED ORDER — CITALOPRAM HYDROBROMIDE 20 MG PO TABS: 20.0000 mg | ORAL_TABLET | Freq: Every day | ORAL | 3 refills | Status: DC

## 2022-05-19 NOTE — Progress Notes (Signed)
[place] MD/PA/NP OP Progress Note Virtual Visit via Telephone Note  I connected with Vicki Lee on 05/19/22 at  1:30 PM EDT by telephone and verified that I am speaking with the correct person using two identifiers.  Location: Patient: home Provider: Clinic   I discussed the limitations, risks, security and privacy concerns of performing an evaluation and management service by telephone and the availability of in person appointments. I also discussed with the patient that there may be a patient responsible charge related to this service. The patient expressed understanding and agreed to proceed.   I provided 30 minutes of non-face-to-face time during this encounter.            05/19/2022 3:44 PM Vicki Lee  MRN:  [id number]  Chief Complaint: "I'm taking one day at a time"  HPI: 41 year-old female seen today for follow up psychiatric evaluation. Patient has a psychiatric history of schizophrenia, depression, and anxiety. She is currently managed on Depakote 1500 mg nightly, Buspar 15 mg TID, Trazodone 200 mg nightly, Zyprexa 20 mg nightly, Hydroxyzine 50 mg four times daily, and Celexa 20 mg daily. Today patient notes that medications are somewhat effective in managing her symptoms.   Today patients was well-groomed, pleasant, cooperative, engaged in conversation, maintained eye contact.  She informed Probation officer that she is taking one day at a time.  She reports that she continues to suffer from hallucinations.  She notes that her voice is instructs her to do things before she does them.  Provider discussed with patient at her last visit about changing antipsychotic or adding an antipsychotic to her current regimen.  At patient's last visit  and today provider discussed Clozaril and Haldol as potential options.  Patient informed writer that at this time she is not ready to change her medication regimen and request that medications not be adjusted today.  She reports that her  hallucinations are difficult to cope with and frustrating but notes that she will continue to cope with them without adjustments.  Patient informed writer that her anxiety and depression continues to be problematic.  Writer conducted a GAD-7 and patient scored a 21, her last visit she scored a 15.  Provider also conducted PHQ-9 and patient scored a 17, at her last visit she scored a 14.  She endorses fluctuations in sleep.  Patient notes that her appetite is adequate.  Today she denies SI/HI or paranoia.    Provider asked patient if she would be willing to increase BuSpar to more Celexa to help manage her anxiety and depression.  At this time she notes that she does not want medications adjusted.  She notes that she "consider Haldol or Clozaril at a different visit.  No other concerns at this time. Visit Diagnosis:    ICD-10-CM   1. Schizoaffective disorder, bipolar type ([place])  F25.0 busPIRone (BUSPAR) 15 MG tablet    citalopram (CELEXA) 20 MG tablet    OLANZapine (ZYPREXA) 20 MG tablet    divalproex (DEPAKOTE) 500 MG DR tablet    traZODone (DESYREL) 100 MG tablet    2. Generalized anxiety disorder  F41.1 busPIRone (BUSPAR) 15 MG tablet    hydrOXYzine (VISTARIL) 50 MG capsule      Past Psychiatric History: Anxiety, depression, and schizophrenia Past Medical History:  Past Medical History:  Diagnosis Date   Anxiety    Crohn disease ([place])    Depression    Fibroid    Infertility, female     Past Surgical History:  Procedure  Laterality Date   COLON SURGERY  2000    due to Chron's age 48.   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   MYOMECTOMY  2019    Family Psychiatric History: Unknown  Family History:  Family History  Problem Relation Age of Onset   Hyperlipidemia Mother    Hypertension Mother    Crohn's disease Father    Crohn's disease Brother    Mental illness Maternal Grandmother    Bone cancer Maternal Grandfather    Mental illness Paternal Grandmother     Social  History:  Social History   Socioeconomic History   Marital status: Married    Spouse name: Not on file   Number of children: Not on file   Years of education: Not on file   Highest education level: Not on file  Occupational History   Not on file  Tobacco Use   Smoking status: Never   Smokeless tobacco: Never  Vaping Use   Vaping Use: Never used  Substance and Sexual Activity   Alcohol use: No   Drug use: No   Sexual activity: Yes    Partners: Male    Birth control/protection: None  Other Topics Concern   Not on file  Social History Narrative   Not on file   Social Determinants of Health   Financial Resource Strain: Not on file  Food Insecurity: Not on file  Transportation Needs: Not on file  Physical Activity: Not on file  Stress: Not on file  Social Connections: Not on file    Allergies: Not on File  Metabolic Disorder Labs: Lab Results  Component Value Date   HGBA1C 5.4 08/01/2018   Lab Results  Component Value Date   PROLACTIN 171.3 (H) 05/19/2016   PROLACTIN 159.4 (H) 01/07/2016   Lab Results  Component Value Date   CHOL 196 05/29/2018   TRIG 177 (H) 05/29/2018   HDL 39 (L) 05/29/2018   CHOLHDL 5.0 (H) 05/29/2018   VLDL 19 05/19/2016   LDLCALC 122 (H) 05/29/2018   LDLCALC 129 (H) 05/19/2016   Lab Results  Component Value Date   TSH 2.384 05/19/2016   TSH 2.461 01/07/2016    Therapeutic Level Labs: No results found for: "LITHIUM" No results found for: "VALPROATE" No results found for: "CBMZ"  Current Medications: Current Outpatient Medications  Medication Sig Dispense Refill   busPIRone (BUSPAR) 15 MG tablet Take 1 tablet (15 mg total) by mouth 3 (three) times daily. 90 tablet 3   citalopram (CELEXA) 20 MG tablet Take 1 tablet (20 mg total) by mouth daily. 30 tablet 3   divalproex (DEPAKOTE) 500 MG DR tablet Take 3 tablets (1,500 mg total) by mouth at bedtime. 90 tablet 3   HYDROcodone-acetaminophen (NORCO/VICODIN) 5-325 MG tablet Take  1-2 tablets by mouth every 6 (six) hours as needed for severe pain. 12 tablet 0   hydrOXYzine (VISTARIL) 50 MG capsule Take 1 capsule (50 mg total) by mouth every 6 (six) hours as needed for anxiety. 120 capsule 3   Multiple Vitamins-Minerals (MULTIVITAMIN PO) Take by mouth.     OLANZapine (ZYPREXA) 20 MG tablet Take 1 tablet (20 mg total) by mouth at bedtime. 30 tablet 3   ondansetron (ZOFRAN) 4 MG tablet Take 1 tablet (4 mg total) by mouth every 6 (six) hours. 12 tablet 0   traZODone (DESYREL) 100 MG tablet Take 2 tablets (200 mg total) by mouth at bedtime. 60 tablet 3   No current facility-administered medications  for this visit.     Musculoskeletal: Strength & Muscle Tone: within normal limits and telehealthvisit Gait & Station: normal,  telephone visit.  Patient leans: N/A  Psychiatric Specialty Exam: Review of Systems  There were no vitals taken for this visit.There is no height or weight on file to calculate BMI.  General Appearance: Well Groomed  Eye Contact:  Good  Speech:  Clear and Coherent and Normal Rate  Volume:  Normal  Mood:  Anxious and Depressed  Affect:  Congruent  Thought Process:  Coherent, Goal Directed and Linear  Orientation:  Full (Time, Place, and Person)  Thought Content: Logical and Hallucinations: Auditory   Suicidal Thoughts:  No  Homicidal Thoughts:  No  Memory:  Immediate;   Good Recent;   Good Remote;   Good  Judgement:  Good  Insight:  Good  Psychomotor Activity:  Normal and Telehealth visit  Concentration:  Concentration: Good and Attention Span: Good  Recall:  Good  Fund of Knowledge: Good  Language: Good  Akathisia:  No  Handed:  Right  AIMS (if indicated):  done, WNL  Assets:  Communication Skills Desire for Improvement Financial Resources/Insurance Housing  ADL's:  Intact  Cognition: WNL  Sleep:  Fair   Screenings: AIMS    Flowsheet Row Admission (Discharged) from 01/06/2016 in [place] Total  Score 0      AUDIT    Flowsheet Row Admission (Discharged) from 05/18/2016 in [place] Admission (Discharged) from 01/06/2016 in [place]  Alcohol Use Disorder Identification Test Final Score (AUDIT) 0 0      GAD-7    Flowsheet Row Video Visit from 05/19/2022 in [facility] Video Visit from 02/16/2022 in [facility] Video Visit from 05/20/2021 in [facility] Video Visit from 02/18/2021 in [facility] Video Visit from 11/24/2020 in [facility]  Total GAD-7 Score 21 15 14 21 19       PHQ2-9    Flowsheet Row Video Visit from 05/19/2022 in [facility] Video Visit from 02/16/2022 in [facility] Video Visit from 05/20/2021 in [facility] Video Visit from 02/18/2021 in [facility] Video Visit from 11/24/2020 in [facility]  PHQ-2 Total Score 4 4 4 6 3   PHQ-9 Total Score 17 14 17 25 18       Flowsheet Row Video Visit from 02/18/2021 in [facility] Video Visit from 11/24/2020 in [place] Error: Q7 should not be populated when Q6 is No No Risk        Assessment and Plan: Patient endorses symptoms of anxiety, depression, and auditory hallucinations.  Provider discussed trialing Clozaril or Haldol to help manage symptoms of psychosis however patient was not agreeable.  Provider also recommended increasing BuSpar or Celexa however patient prefers that medications not be adjusted.  She will continue all medications as prescribed.    1. Schizoaffective disorder, bipolar type ([place])  Continue- busPIRone (BUSPAR) 15 MG tablet; Take 1 tablet (15 mg total) by mouth 3 (three) times daily.  Dispense: 90  tablet; Refill: 3 Continue- citalopram (CELEXA) 20 MG tablet; Take 1 tablet (20 mg total) by mouth daily.  Dispense: 30 tablet; Refill: 3 Continue- divalproex (DEPAKOTE) 500 MG DR tablet; Take 3 tablets (1,500 mg total) by mouth at  bedtime.  Dispense: 90 tablet; Refill: 3 Continue- OLANZapine (ZYPREXA) 20 MG tablet; Take 1 tablet (20 mg total) by mouth at bedtime.  Dispense: 30 tablet; Refill: 3 Continue- traZODone (DESYREL) 100 MG tablet; Take 2 tablets (200 mg total) by mouth at bedtime.  Dispense: 60 tablet; Refill: 3  2. Generalized anxiety disorder  Continue- busPIRone (BUSPAR) 15 MG tablet; Take 1 tablet (15 mg total) by mouth 3 (three) times daily.  Dispense: 90 tablet; Refill: 3 Continue- hydrOXYzine (VISTARIL) 50 MG capsule; Take 1 capsule (50 mg total) by mouth every 6 (six) hours as needed for anxiety.  Dispense: 120 capsule; Refill: 2      Follow-up in 3 month    Salley Slaughter, NP 05/19/2022, 3:44 PM

## 2022-06-30 ENCOUNTER — Telehealth (HOSPITAL_COMMUNITY): Payer: Self-pay | Admitting: *Deleted

## 2022-06-30 NOTE — Telephone Encounter (Signed)
Patient called Vicki Lee stating that Dr Ronne Binning hasn't sent in her refills

## 2022-07-01 NOTE — Telephone Encounter (Signed)
Provider called patient to discuss which refill she is needing.  Patient's voicemail picked up.  At patient's last visit in September each medication had 3 refills.  Provider left voicemail informing patient of this.

## 2022-08-15 ENCOUNTER — Telehealth (INDEPENDENT_AMBULATORY_CARE_PROVIDER_SITE_OTHER): Payer: No Payment, Other | Admitting: Psychiatry

## 2022-08-15 ENCOUNTER — Encounter (HOSPITAL_COMMUNITY): Payer: Self-pay | Admitting: Psychiatry

## 2022-08-15 DIAGNOSIS — F250 Schizoaffective disorder, bipolar type: Secondary | ICD-10-CM | POA: Diagnosis not present

## 2022-08-15 DIAGNOSIS — F411 Generalized anxiety disorder: Secondary | ICD-10-CM | POA: Diagnosis not present

## 2022-08-15 MED ORDER — DIVALPROEX SODIUM 500 MG PO DR TAB: 1500.0000 mg | DELAYED_RELEASE_TABLET | Freq: Every evening | ORAL | 3 refills | Status: DC

## 2022-08-15 MED ORDER — HYDROXYZINE PAMOATE 50 MG PO CAPS: 50.0000 mg | ORAL_CAPSULE | Freq: Four times a day (QID) | ORAL | 3 refills | Status: DC | PRN

## 2022-08-15 MED ORDER — HALOPERIDOL 2 MG PO TABS: 2.0000 mg | ORAL_TABLET | Freq: Every evening | ORAL | 3 refills | Status: DC

## 2022-08-15 MED ORDER — TRAZODONE HCL 100 MG PO TABS: 200.0000 mg | ORAL_TABLET | Freq: Every day | ORAL | 3 refills | Status: DC

## 2022-08-15 MED ORDER — BUSPIRONE HCL 15 MG PO TABS: 15.0000 mg | ORAL_TABLET | Freq: Three times a day (TID) | ORAL | 3 refills | Status: DC

## 2022-08-15 MED ORDER — CITALOPRAM HYDROBROMIDE 20 MG PO TABS: 20.0000 mg | ORAL_TABLET | Freq: Every day | ORAL | 3 refills | Status: DC

## 2022-08-15 MED ORDER — OLANZAPINE 20 MG PO TABS: 20.0000 mg | ORAL_TABLET | Freq: Every day | ORAL | 3 refills | Status: DC

## 2022-08-15 NOTE — Progress Notes (Signed)
[place] MD/PA/NP OP Progress Note Virtual Visit via Telephone Note  I connected with Vicki Lee on 08/15/22 at 11:00 AM EST by telephone and verified that I am speaking with the correct person using two identifiers.  Location: Patient: home Provider: Clinic   I discussed the limitations, risks, security and privacy concerns of performing an evaluation and management service by telephone and the availability of in person appointments. I also discussed with the patient that there may be a patient responsible charge related to this service. The patient expressed understanding and agreed to proceed.   I provided 30 minutes of non-face-to-face time during this encounter.            08/15/2022 11:09 AM Vicki Lee  MRN:  [id number]  Chief Complaint: "I wish the voices to stop"  HPI: 41 year-old female seen today for follow up psychiatric evaluation. Patient has a psychiatric history of schizophrenia, depression, and anxiety. She is currently managed on Depakote 1500 mg nightly, Buspar 15 mg TID, Trazodone 200 mg nightly, Zyprexa 20 mg nightly, Hydroxyzine 50 mg four times daily, and Celexa 20 mg daily. Today patient notes that medications are somewhat effective in managing her symptoms.   Today patients was unable to login virtually for assessment done over the phone.  During exam she was pleasant, cooperative, engaged in conversation.  She informed Probation officer that she wants her voices to stop.  She notes that they continue to tell her what to do before she does it.  Patient notes that she is skeptical about starting new medications.  At patient's last visit  and today provider discussed Clozaril and Haldol as potential options to help manage psychosis.  Provider recommended starting a low-dose of Haldol and patient was agreeable.  Patient informed writer that her anxiety and depression continues to be problematic.  She notes that she worries about her health, finances, and getting into an  accident while driving.  Provider conducted a GAD and patient scored an 11, at her last visit she scored a 21.  Provider also conducted PHQ-9 and patient scored a 14, at her last visit she scored a 17.  She notes that she sleeps approximately 5 hours.  Since her last visit patient notes that she has gained 20 pounds and she has an increased appetite.  Today she denies SI/HI or mania.    Today patient is agreeable to starting Haldol 2 mg nightly to help manage symptoms of psychosis and depression.  She will continue her other medications as prescribed.  Patient has has not had labs drawn in over 6 months.  Today provider ordered CBC, LFT, valproic acid level, thyroid function, and hemoglobin A1c.  Provider recommended patient come in the office for her next visit however she was not agreeable.  Potential side effects of medication and risks vs benefits of treatment vs non-treatment were explained and discussed. All questions were answered. No other concerns noted at this time.   Visit Diagnosis:    ICD-10-CM   1. Generalized anxiety disorder  F41.1 busPIRone (BUSPAR) 15 MG tablet    hydrOXYzine (VISTARIL) 50 MG capsule    2. Schizoaffective disorder, bipolar type (HCC)  F25.0 haloperidol (HALDOL) 2 MG tablet    CBC w/Diff/Platelet    Thyroid Panel With TSH    Hepatic function panel    busPIRone (BUSPAR) 15 MG tablet    citalopram (CELEXA) 20 MG tablet    divalproex (DEPAKOTE) 500 MG DR tablet    OLANZapine (ZYPREXA) 20 MG tablet  traZODone (DESYREL) 100 MG tablet    Valproic Acid level    HgB A1c      Past Psychiatric History: Anxiety, depression, and schizophrenia Past Medical History:  Past Medical History:  Diagnosis Date   Anxiety    Crohn disease ([place])    Depression    Fibroid    Infertility, female     Past Surgical History:  Procedure Laterality Date   COLON SURGERY  2000    due to Chron's age 83.   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   MYOMECTOMY  2019    Family  Psychiatric History: Unknown  Family History:  Family History  Problem Relation Age of Onset   Hyperlipidemia Mother    Hypertension Mother    Crohn's disease Father    Crohn's disease Brother    Mental illness Maternal Grandmother    Bone cancer Maternal Grandfather    Mental illness Paternal Grandmother     Social History:  Social History   Socioeconomic History   Marital status: Married    Spouse name: Not on file   Number of children: Not on file   Years of education: Not on file   Highest education level: Not on file  Occupational History   Not on file  Tobacco Use   Smoking status: Never   Smokeless tobacco: Never  Vaping Use   Vaping Use: Never used  Substance and Sexual Activity   Alcohol use: No   Drug use: No   Sexual activity: Yes    Partners: Male    Birth control/protection: None  Other Topics Concern   Not on file  Social History Narrative   Not on file   Social Determinants of Health   Financial Resource Strain: Not on file  Food Insecurity: Not on file  Transportation Needs: Not on file  Physical Activity: Not on file  Stress: Not on file  Social Connections: Not on file    Allergies: Not on File  Metabolic Disorder Labs: Lab Results  Component Value Date   HGBA1C 5.4 08/01/2018   Lab Results  Component Value Date   PROLACTIN 171.3 (H) 05/19/2016   PROLACTIN 159.4 (H) 01/07/2016   Lab Results  Component Value Date   CHOL 196 05/29/2018   TRIG 177 (H) 05/29/2018   HDL 39 (L) 05/29/2018   CHOLHDL 5.0 (H) 05/29/2018   VLDL 19 05/19/2016   LDLCALC 122 (H) 05/29/2018   LDLCALC 129 (H) 05/19/2016   Lab Results  Component Value Date   TSH 2.384 05/19/2016   TSH 2.461 01/07/2016    Therapeutic Level Labs: No results found for: "LITHIUM" No results found for: "VALPROATE" No results found for: "CBMZ"  Current Medications: Current Outpatient Medications  Medication Sig Dispense Refill   haloperidol (HALDOL) 2 MG tablet Take 1  tablet (2 mg total) by mouth at bedtime. 30 tablet 3   busPIRone (BUSPAR) 15 MG tablet Take 1 tablet (15 mg total) by mouth 3 (three) times daily. 90 tablet 3   citalopram (CELEXA) 20 MG tablet Take 1 tablet (20 mg total) by mouth daily. 30 tablet 3   divalproex (DEPAKOTE) 500 MG DR tablet Take 3 tablets (1,500 mg total) by mouth at bedtime. 90 tablet 3   HYDROcodone-acetaminophen (NORCO/VICODIN) 5-325 MG tablet Take 1-2 tablets by mouth every 6 (six) hours as needed for severe pain. 12 tablet 0   hydrOXYzine (VISTARIL) 50 MG capsule Take 1 capsule (50 mg total) by mouth every  6 (six) hours as needed for anxiety. 120 capsule 3   Multiple Vitamins-Minerals (MULTIVITAMIN PO) Take by mouth.     OLANZapine (ZYPREXA) 20 MG tablet Take 1 tablet (20 mg total) by mouth at bedtime. 30 tablet 3   ondansetron (ZOFRAN) 4 MG tablet Take 1 tablet (4 mg total) by mouth every 6 (six) hours. 12 tablet 0   traZODone (DESYREL) 100 MG tablet Take 2 tablets (200 mg total) by mouth at bedtime. 60 tablet 3   No current facility-administered medications for this visit.     Musculoskeletal: Strength & Muscle Tone: within normal limits and telehealthvisit Gait & Station: normal,  telephone visit.  Patient leans: N/A  Psychiatric Specialty Exam: Review of Systems  There were no vitals taken for this visit.There is no height or weight on file to calculate BMI.  General Appearance: Well Groomed  Eye Contact:  Good  Speech:  Clear and Coherent and Normal Rate  Volume:  Normal  Mood:  Anxious and Depressed  Affect:  Congruent  Thought Process:  Coherent, Goal Directed and Linear  Orientation:  Full (Time, Place, and Person)  Thought Content: Logical and Hallucinations: Auditory   Suicidal Thoughts:  No  Homicidal Thoughts:  No  Memory:  Immediate;   Good Recent;   Good Remote;   Good  Judgement:  Good  Insight:  Good  Psychomotor Activity:  Normal and Telehealth visit  Concentration:  Concentration: Good  and Attention Span: Good  Recall:  Good  Fund of Knowledge: Good  Language: Good  Akathisia:  No  Handed:  Right  AIMS (if indicated):  done, WNL  Assets:  Communication Skills Desire for Improvement Financial Resources/Insurance Housing  ADL's:  Intact  Cognition: WNL  Sleep:  Fair   Screenings: AIMS    Flowsheet Row Video Visit from 05/19/2022 in [facility] Admission (Discharged) from 01/06/2016 in  Total Score 0 0      AUDIT    [place] Admission (Discharged) from 05/18/2016 in [place] Admission (Discharged) from 01/06/2016 in [place]  Alcohol Use Disorder Identification Test Final Score (AUDIT) 0 0      GAD-7    Flowsheet Row Video Visit from 08/15/2022 in [facility] Video Visit from 05/19/2022 in [facility] Video Visit from 02/16/2022 in [facility] Video Visit from 05/20/2021 in [facility] Video Visit from 02/18/2021 in [facility]  Total GAD-7 Score 11 21 15 14 21       PHQ2-9    Flowsheet Row Video Visit from 08/15/2022 in [facility] Video Visit from 05/19/2022 in [facility] Video Visit from 02/16/2022 in [facility] Video Visit from 05/20/2021 in [facility] Video Visit from 02/18/2021 in [place]  PHQ-2 Total Score 4 4 4 4 6   PHQ-9 Total Score 14 17 14 17 25       Flowsheet Row Video Visit from 02/18/2021 in [facility] Video Visit from 11/24/2020 in [place] Error: Q7 should not be populated when Q6 is No No Risk        Assessment and Plan: Patient endorses symptoms of anxiety,  depression, and auditory hallucinations.  Today patient is agreeable to starting Haldol 2 mg nightly to help  manage symptoms of psychosis and depression.  She will continue her other medications as prescribed.  Patient has has not had labs drawn in over 6 months.  Today provider ordered CBC, LFT, valproic acid level, thyroid function, and hemoglobin A1c.  Provider recommended patient come in the office for her next visit however she was not agreeable.   1. Generalized anxiety disorder  Continue- busPIRone (BUSPAR) 15 MG tablet; Take 1 tablet (15 mg total) by mouth 3 (three) times daily.  Dispense: 90 tablet; Refill: 3 Continue- hydrOXYzine (VISTARIL) 50 MG capsule; Take 1 capsule (50 mg total) by mouth every 6 (six) hours as needed for anxiety.  Dispense: 120 capsule; Refill: 3  2. Schizoaffective disorder, bipolar type ([place])  Start- haloperidol (HALDOL) 2 MG tablet; Take 1 tablet (2 mg total) by mouth at bedtime.  Dispense: 30 tablet; Refill: 3 - CBC w/Diff/Platelet - Thyroid Panel With TSH - Hepatic function panel Continue- busPIRone (BUSPAR) 15 MG tablet; Take 1 tablet (15 mg total) by mouth 3 (three) times daily.  Dispense: 90 tablet; Refill: 3 Continue- citalopram (CELEXA) 20 MG tablet; Take 1 tablet (20 mg total) by mouth daily.  Dispense: 30 tablet; Refill: 3 -Continue divalproex (DEPAKOTE) 500 MG DR tablet; Take 3 tablets (1,500 mg total) by mouth at bedtime.  Dispense: 90 tablet; Refill: 3 Continue- OLANZapine (ZYPREXA) 20 MG tablet; Take 1 tablet (20 mg total) by mouth at bedtime.  Dispense: 30 tablet; Refill: 3 Continue- traZODone (DESYREL) 100 MG tablet; Take 2 tablets (200 mg total) by mouth at bedtime.  Dispense: 60 tablet; Refill: 3 - Valproic Acid level - HgB A1c     Follow-up in 3 month    Salley Slaughter, NP 08/15/2022, 11:09 AM

## 2022-10-10 ENCOUNTER — Other Ambulatory Visit (HOSPITAL_COMMUNITY): Payer: Self-pay | Admitting: Psychiatry

## 2022-10-10 ENCOUNTER — Telehealth (HOSPITAL_COMMUNITY): Payer: Self-pay | Admitting: *Deleted

## 2022-10-10 DIAGNOSIS — F250 Schizoaffective disorder, bipolar type: Secondary | ICD-10-CM

## 2022-10-10 MED ORDER — OLANZAPINE 20 MG PO TABS: 20.0000 mg | ORAL_TABLET | Freq: Every day | ORAL | 3 refills | Status: DC

## 2022-10-10 NOTE — Telephone Encounter (Signed)
[id number] - [place], [place] - [address]  Rx REFILL REQUEST   -- OLANZapine (ZYPREXA) 20 MG tablet   NEXT APPT 11/02/22

## 2022-10-10 NOTE — Telephone Encounter (Signed)
Medication refilled and sent to preferred pharmacy

## 2022-11-02 ENCOUNTER — Telehealth (INDEPENDENT_AMBULATORY_CARE_PROVIDER_SITE_OTHER): Payer: Self-pay | Admitting: Psychiatry

## 2022-11-02 ENCOUNTER — Encounter (HOSPITAL_COMMUNITY): Payer: Self-pay | Admitting: Psychiatry

## 2022-11-02 DIAGNOSIS — F250 Schizoaffective disorder, bipolar type: Secondary | ICD-10-CM

## 2022-11-02 DIAGNOSIS — F411 Generalized anxiety disorder: Secondary | ICD-10-CM

## 2022-11-02 MED ORDER — HALOPERIDOL 5 MG PO TABS: 5.0000 mg | ORAL_TABLET | Freq: Every evening | ORAL | 3 refills | Status: DC

## 2022-11-02 MED ORDER — TRAZODONE HCL 100 MG PO TABS: 200.0000 mg | ORAL_TABLET | Freq: Every day | ORAL | 3 refills | Status: DC

## 2022-11-02 MED ORDER — BUSPIRONE HCL 15 MG PO TABS: 15.0000 mg | ORAL_TABLET | Freq: Three times a day (TID) | ORAL | 3 refills | Status: DC

## 2022-11-02 MED ORDER — HYDROXYZINE PAMOATE 50 MG PO CAPS: 50.0000 mg | ORAL_CAPSULE | Freq: Four times a day (QID) | ORAL | 3 refills | Status: DC | PRN

## 2022-11-02 MED ORDER — DIVALPROEX SODIUM 500 MG PO DR TAB: 1500.0000 mg | DELAYED_RELEASE_TABLET | Freq: Every evening | ORAL | 3 refills | Status: DC

## 2022-11-02 MED ORDER — CITALOPRAM HYDROBROMIDE 20 MG PO TABS: 20.0000 mg | ORAL_TABLET | Freq: Every day | ORAL | 3 refills | Status: DC

## 2022-11-02 MED ORDER — OLANZAPINE 20 MG PO TABS: 20.0000 mg | ORAL_TABLET | Freq: Every day | ORAL | 3 refills | Status: DC

## 2022-11-02 NOTE — Progress Notes (Signed)
[facility] MD/PA/NP OP Progress Note Virtual Visit via Video Note  I connected with [name] on 11/02/22 at  2:30 PM EST by a video enabled telemedicine application and verified that I am speaking with the correct person using two identifiers.  Location: Patient: Home Provider: Clinic   I discussed the limitations of evaluation and management by telemedicine and the availability of in person appointments. The patient expressed understanding and agreed to proceed.  I provided 30 minutes of non-face-to-face time during this encounter.             11/02/2022 3:17 PM Carnetta Deveau  MRN:  [id number]  Chief Complaint: "I am not doing good"  HPI: 42 year-old female seen today for follow up psychiatric evaluation. Patient has a psychiatric history of schizophrenia, depression, and anxiety. She is currently managed on Depakote 1500 mg nightly, Buspar 15 mg TID, Haldol 2 mg trazodone 200 mg nightly, Zyprexa 20 mg nightly, Hydroxyzine 50 mg four times daily, and Celexa 20 mg daily. Today patient notes that medications are somewhat effective in managing her symptoms.   Today she was well-groomed, pleasant, cooperative, engaged in conversation, and maintained eye contact.  She informed Probation officer that she is not doing good.  She reports that she is getting over a cold.  She also notes that she continues to suffer from auditory hallucinations which she reports at times can be overwhelming.  Patient notes that her voice is worsen and at night.  She also notes that while driving that worsens.  She informed Probation officer that at times she avoids driving.  Today provider conducted a GAD-7 and patient scored a 14, at her last visit she scored an 11.  Provider also conducted PHQ-9 and patient scored a 14, at her last visit she scored a 14.  She endorses fluctuations in sleep.  Today she denies SI/HI or mania.    Provider conducted an aims assessment and patient scored a 0.  She denies abnormal muscle movements.   Today she is agreeable to increasing Haldol 2 mg nightly to 5 mg nightly to help manage depression and symptoms of psychosis.  She will continue her other medications as prescribed. No other concerns noted at this time.   Visit Diagnosis:    ICD-10-CM   1. Generalized anxiety disorder  F41.1 busPIRone (BUSPAR) 15 MG tablet    hydrOXYzine (VISTARIL) 50 MG capsule    2. Schizoaffective disorder, bipolar type (HCC)  F25.0 busPIRone (BUSPAR) 15 MG tablet    citalopram (CELEXA) 20 MG tablet    divalproex (DEPAKOTE) 500 MG DR tablet    haloperidol (HALDOL) 5 MG tablet    OLANZapine (ZYPREXA) 20 MG tablet    traZODone (DESYREL) 100 MG tablet      Past Psychiatric History: Anxiety, depression, and schizophrenia Past Medical History:  Past Medical History:  Diagnosis Date   Anxiety    Crohn disease ([place])    Depression    Fibroid    Infertility, female     Past Surgical History:  Procedure Laterality Date   COLON SURGERY  2000    due to Chron's age 42.   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   MYOMECTOMY  2019    Family Psychiatric History: Unknown  Family History:  Family History  Problem Relation Age of Onset   Hyperlipidemia Mother    Hypertension Mother    Crohn's disease Father    Crohn's disease Brother    Mental illness Maternal Grandmother  Bone cancer Maternal Grandfather    Mental illness Paternal Grandmother     Social History:  Social History   Socioeconomic History   Marital status: Married    Spouse name: Not on file   Number of children: Not on file   Years of education: Not on file   Highest education level: Not on file  Occupational History   Not on file  Tobacco Use   Smoking status: Never   Smokeless tobacco: Never  Vaping Use   Vaping Use: Never used  Substance and Sexual Activity   Alcohol use: No   Drug use: No   Sexual activity: Yes    Partners: Male    Birth control/protection: None  Other Topics Concern   Not on file  Social  History Narrative   Not on file   Social Determinants of Health   Financial Resource Strain: Not on file  Food Insecurity: Not on file  Transportation Needs: Not on file  Physical Activity: Not on file  Stress: Not on file  Social Connections: Not on file    Allergies: Not on File  Metabolic Disorder Labs: Lab Results  Component Value Date   HGBA1C 5.4 08/01/2018   Lab Results  Component Value Date   PROLACTIN 171.3 (H) 05/19/2016   PROLACTIN 159.4 (H) 01/07/2016   Lab Results  Component Value Date   CHOL 196 05/29/2018   TRIG 177 (H) 05/29/2018   HDL 39 (L) 05/29/2018   CHOLHDL 5.0 (H) 05/29/2018   VLDL 19 05/19/2016   LDLCALC 122 (H) 05/29/2018   LDLCALC 129 (H) 05/19/2016   Lab Results  Component Value Date   TSH 2.384 05/19/2016   TSH 2.461 01/07/2016    Therapeutic Level Labs: No results found for: "LITHIUM" No results found for: "VALPROATE" No results found for: "CBMZ"  Current Medications: Current Outpatient Medications  Medication Sig Dispense Refill   busPIRone (BUSPAR) 15 MG tablet Take 1 tablet (15 mg total) by mouth 3 (three) times daily. 90 tablet 3   citalopram (CELEXA) 20 MG tablet Take 1 tablet (20 mg total) by mouth daily. 30 tablet 3   divalproex (DEPAKOTE) 500 MG DR tablet Take 3 tablets (1,500 mg total) by mouth at bedtime. 90 tablet 3   haloperidol (HALDOL) 5 MG tablet Take 1 tablet (5 mg total) by mouth at bedtime. 30 tablet 3   HYDROcodone-acetaminophen (NORCO/VICODIN) 5-325 MG tablet Take 1-2 tablets by mouth every 6 (six) hours as needed for severe pain. 12 tablet 0   hydrOXYzine (VISTARIL) 50 MG capsule Take 1 capsule (50 mg total) by mouth every 6 (six) hours as needed for anxiety. 120 capsule 3   Multiple Vitamins-Minerals (MULTIVITAMIN PO) Take by mouth.     OLANZapine (ZYPREXA) 20 MG tablet Take 1 tablet (20 mg total) by mouth at bedtime. 30 tablet 3   ondansetron (ZOFRAN) 4 MG tablet Take 1 tablet (4 mg total) by mouth every 6  (six) hours. 12 tablet 0   traZODone (DESYREL) 100 MG tablet Take 2 tablets (200 mg total) by mouth at bedtime. 60 tablet 3   No current facility-administered medications for this visit.     Musculoskeletal: Strength & Muscle Tone: within normal limits and telehealthvisit Gait & Station: normal,  telephone visit.  Patient leans: N/A  Psychiatric Specialty Exam: Review of Systems  There were no vitals taken for this visit.There is no height or weight on file to calculate BMI.  General Appearance: Well Groomed  Eye Contact:  Good  Speech:  Clear and Coherent and Normal Rate  Volume:  Normal  Mood:  Anxious and Depressed  Affect:  Congruent  Thought Process:  Coherent, Goal Directed and Linear  Orientation:  Full (Time, Place, and Person)  Thought Content: Logical and Hallucinations: Auditory   Suicidal Thoughts:  No  Homicidal Thoughts:  No  Memory:  Immediate;   Good Recent;   Good Remote;   Good  Judgement:  Good  Insight:  Good  Psychomotor Activity:  Normal and Telehealth visit  Concentration:  Concentration: Good and Attention Span: Good  Recall:  Good  Fund of Knowledge: Good  Language: Good  Akathisia:  No  Handed:  Right  AIMS (if indicated):  done, WNL  Assets:  Communication Skills Desire for Improvement Financial Resources/Insurance Housing  ADL's:  Intact  Cognition: WNL  Sleep:  Fair   Screenings: AIMS    Flowsheet Row Video Visit from 05/19/2022 in [facility] Admission (Discharged) from 01/06/2016 in [place] Total Score 0 0      AUDIT    [place] Admission (Discharged) from 05/18/2016 in [place] Admission (Discharged) from 01/06/2016 in [place]  Alcohol Use Disorder Identification Test Final Score (AUDIT) 0 0      GAD-7    Flowsheet Row Video Visit from 11/02/2022 in [facility] Video Visit from  08/15/2022 in [facility] Video Visit from 05/19/2022 in [facility] Video Visit from 02/16/2022 in [facility] Video Visit from 05/20/2021 in [facility]  Total GAD-7 Score 14 11 21 15 14      $ PHQ2-9    Flowsheet Row Video Visit from 11/02/2022 in [facility] Video Visit from 08/15/2022 in [facility] Video Visit from 05/19/2022 in [facility] Video Visit from 02/16/2022 in [facility] Video Visit from 05/20/2021 in [place]  PHQ-2 Total Score 4 4 4 4 4  $ PHQ-9 Total Score 14 14 17 14 17      $ Flowsheet Row Video Visit from 02/18/2021 in [facility] Video Visit from 11/24/2020 in [place] Error: Q7 should not be populated when Q6 is No No Risk        Assessment and Plan: Patient endorses symptoms of anxiety, depression, and auditory hallucinations.  Provider conducted an aims assessment and patient scored a 0.  She denies abnormal muscle movements.  Today she is agreeable to increasing Haldol 2 mg nightly to 5 mg nightly to help manage depression and symptoms of psychosis.  She will continue her other medications as prescribed.   1. Generalized anxiety disorder  Continue- busPIRone (BUSPAR) 15 MG tablet; Take 1 tablet (15 mg total) by mouth 3 (three) times daily.  Dispense: 90 tablet; Refill: 3 Continue- hydrOXYzine (VISTARIL) 50 MG capsule; Take 1 capsule (50 mg total) by mouth every 6 (six) hours as needed for anxiety.  Dispense: 120 capsule; Refill: 3  2. Schizoaffective disorder, bipolar type (HCC)  Continue- busPIRone (BUSPAR) 15 MG tablet; Take 1 tablet (15 mg total) by mouth 3 (three) times daily.  Dispense: 90 tablet; Refill: 3 Continue- citalopram (CELEXA)  20 MG tablet; Take 1 tablet (20 mg total) by mouth daily.  Dispense: 30 tablet; Refill: 3 Continue- divalproex (DEPAKOTE) 500 MG DR  tablet; Take 3 tablets (1,500 mg total) by mouth at bedtime.  Dispense: 90 tablet; Refill: 3 Increased- haloperidol (HALDOL) 5 MG tablet; Take 1 tablet (5 mg total) by mouth at bedtime.  Dispense: 30 tablet; Refill: 3 Continue- OLANZapine (ZYPREXA) 20 MG tablet; Take 1 tablet (20 mg total) by mouth at bedtime.  Dispense: 30 tablet; Refill: 3 Continue- traZODone (DESYREL) 100 MG tablet; Take 2 tablets (200 mg total) by mouth at bedtime.  Dispense: 60 tablet; Refill: 3     Follow-up in 3 month    Salley Slaughter, NP 11/02/2022, 3:17 PM

## 2023-01-25 ENCOUNTER — Telehealth (INDEPENDENT_AMBULATORY_CARE_PROVIDER_SITE_OTHER): Payer: Medicare Other | Admitting: Psychiatry

## 2023-01-25 ENCOUNTER — Encounter (HOSPITAL_COMMUNITY): Payer: Self-pay | Admitting: Psychiatry

## 2023-01-25 DIAGNOSIS — F411 Generalized anxiety disorder: Secondary | ICD-10-CM

## 2023-01-25 DIAGNOSIS — F250 Schizoaffective disorder, bipolar type: Secondary | ICD-10-CM | POA: Diagnosis not present

## 2023-01-25 MED ORDER — DIVALPROEX SODIUM 500 MG PO DR TAB: 1500.0000 mg | DELAYED_RELEASE_TABLET | Freq: Every evening | ORAL | 3 refills | Status: DC

## 2023-01-25 MED ORDER — CITALOPRAM HYDROBROMIDE 20 MG PO TABS: 20.0000 mg | ORAL_TABLET | Freq: Every day | ORAL | 3 refills | Status: DC

## 2023-01-25 MED ORDER — HYDROXYZINE PAMOATE 50 MG PO CAPS: 50.0000 mg | ORAL_CAPSULE | Freq: Four times a day (QID) | ORAL | 3 refills | Status: DC | PRN

## 2023-01-25 MED ORDER — HALOPERIDOL 5 MG PO TABS: 5.0000 mg | ORAL_TABLET | Freq: Every evening | ORAL | 3 refills | Status: DC

## 2023-01-25 MED ORDER — BUSPIRONE HCL 15 MG PO TABS: 15.0000 mg | ORAL_TABLET | Freq: Three times a day (TID) | ORAL | 3 refills | Status: DC

## 2023-01-25 MED ORDER — OLANZAPINE 20 MG PO TABS: 20.0000 mg | ORAL_TABLET | Freq: Every day | ORAL | 3 refills | Status: DC

## 2023-01-25 MED ORDER — TRAZODONE HCL 100 MG PO TABS
200.0000 mg | ORAL_TABLET | Freq: Every day | ORAL | 3 refills | Status: DC
Start: 2023-01-25 — End: 2023-04-12

## 2023-01-25 NOTE — Progress Notes (Signed)
[facility] MD/PA/NP OP Progress Note Virtual Visit via Video Note  I connected with Vicki Lee on 01/25/23 at  1:00 PM EDT by a video enabled telemedicine application and verified that I am speaking with the correct person using two identifiers.  Location: Patient: Home Provider: Clinic   I discussed the limitations of evaluation and management by telemedicine and the availability of in person appointments. The patient expressed understanding and agreed to proceed.  I provided 30 minutes of non-face-to-face time during this encounter.             01/25/2023 1:08 PM Vicki Lee  MRN:  [id number]  Chief Complaint: "I have been having more anxiety"  HPI: 42 year-old female seen today for follow up psychiatric evaluation. Patient has a psychiatric history of schizophrenia, depression, and anxiety. She is currently managed on Depakote 1500 mg nightly, Buspar 15 mg TID, Haldol 5 mg trazodone 200 mg nightly, Zyprexa 20 mg nightly, Hydroxyzine 50 mg four times daily, and Celexa 20 mg daily. Today patient notes that medications are somewhat effective in managing her symptoms.   Today she was well-groomed, pleasant, cooperative, engaged in conversation, and maintained eye contact.  She informed Clinical research associate that she has been more anxious.  She notes that she becomes fearful when driving and notes that her voices are overwhelming.  She informed Clinical research associate that the voices continue to tell her to do things prior to her doing them.  She she informed Clinical research associate that she tries to explain how she is feeling to her husband however reports that he does not fully understand. Patient hands been seen by neurology and notes that her PCP cannot find organic reason for her hallucinations.  Provider informed patient that Haldol could be increased.  At this time she reports that she would like her medications to stay the same.  Provider also discussed Clozaril to help manage psychosis in the future if the voices continue to  be problematic.  Provider encouraged patient to research Clozaril.  She endorsed understanding and agreed.  To help cope with current hallucinations provider recommended speaking to a therapist.  She was agreeable to this.    Patient notes that the above makes her more depressed as well.  Today provider conducted a GAD-7 and patient scored a 16, at her last visit she scored a 14.  Provider also conducted PHQ-9 patient scored a 15, at her last visit she scored a 14.  She endorses adequate sleep and increased appetite.  Today she denies SI/HI/mania or paranoia.  At this time patient request that medication not be adjusted.  No medication changes made today.  Patient agreeable to continue medication as prescribed.  Patient referred to outpatient counseling for therapy.  No other concerns at this time. Visit Diagnosis:    ICD-10-CM   1. Generalized anxiety disorder  F41.1 busPIRone (BUSPAR) 15 MG tablet    hydrOXYzine (VISTARIL) 50 MG capsule    Ambulatory referral to Social Work    2. Schizoaffective disorder, bipolar type (HCC)  F25.0 busPIRone (BUSPAR) 15 MG tablet    citalopram (CELEXA) 20 MG tablet    divalproex (DEPAKOTE) 500 MG DR tablet    haloperidol (HALDOL) 5 MG tablet    OLANZapine (ZYPREXA) 20 MG tablet    traZODone (DESYREL) 100 MG tablet    Ambulatory referral to Social Work      Past Psychiatric History: Anxiety, depression, and schizophrenia Past Medical History:  Past Medical History:  Diagnosis Date   Anxiety    Crohn disease (  HCC)    Depression    Fibroid    Infertility, female     Past Surgical History:  Procedure Laterality Date   COLON SURGERY  2000    due to Chron's age 60.   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   MYOMECTOMY  2019    Family Psychiatric History: Unknown  Family History:  Family History  Problem Relation Age of Onset   Hyperlipidemia Mother    Hypertension Mother    Crohn's disease Father    Crohn's disease Brother    Mental  illness Maternal Grandmother    Bone cancer Maternal Grandfather    Mental illness Paternal Grandmother     Social History:  Social History   Socioeconomic History   Marital status: Married    Spouse name: Not on file   Number of children: Not on file   Years of education: Not on file   Highest education level: Not on file  Occupational History   Not on file  Tobacco Use   Smoking status: Never   Smokeless tobacco: Never  Vaping Use   Vaping Use: Never used  Substance and Sexual Activity   Alcohol use: No   Drug use: No   Sexual activity: Yes    Partners: Male    Birth control/protection: None  Other Topics Concern   Not on file  Social History Narrative   Not on file   Social Determinants of Health   Financial Resource Strain: Not on file  Food Insecurity: Not on file  Transportation Needs: Not on file  Physical Activity: Not on file  Stress: Not on file  Social Connections: Not on file    Allergies: Not on File  Metabolic Disorder Labs: Lab Results  Component Value Date   HGBA1C 5.4 08/01/2018   Lab Results  Component Value Date   PROLACTIN 171.3 (H) 05/19/2016   PROLACTIN 159.4 (H) 01/07/2016   Lab Results  Component Value Date   CHOL 196 05/29/2018   TRIG 177 (H) 05/29/2018   HDL 39 (L) 05/29/2018   CHOLHDL 5.0 (H) 05/29/2018   VLDL 19 05/19/2016   LDLCALC 122 (H) 05/29/2018   LDLCALC 129 (H) 05/19/2016   Lab Results  Component Value Date   TSH 2.384 05/19/2016   TSH 2.461 01/07/2016    Therapeutic Level Labs: No results found for: "LITHIUM" No results found for: "VALPROATE" No results found for: "CBMZ"  Current Medications: Current Outpatient Medications  Medication Sig Dispense Refill   busPIRone (BUSPAR) 15 MG tablet Take 1 tablet (15 mg total) by mouth 3 (three) times daily. 90 tablet 3   citalopram (CELEXA) 20 MG tablet Take 1 tablet (20 mg total) by mouth daily. 30 tablet 3   divalproex (DEPAKOTE) 500 MG DR tablet Take 3  tablets (1,500 mg total) by mouth at bedtime. 90 tablet 3   haloperidol (HALDOL) 5 MG tablet Take 1 tablet (5 mg total) by mouth at bedtime. 30 tablet 3   HYDROcodone-acetaminophen (NORCO/VICODIN) 5-325 MG tablet Take 1-2 tablets by mouth every 6 (six) hours as needed for severe pain. 12 tablet 0   hydrOXYzine (VISTARIL) 50 MG capsule Take 1 capsule (50 mg total) by mouth every 6 (six) hours as needed for anxiety. 120 capsule 3   Multiple Vitamins-Minerals (MULTIVITAMIN PO) Take by mouth.     OLANZapine (ZYPREXA) 20 MG tablet Take 1 tablet (20 mg total) by mouth at bedtime. 30 tablet 3   ondansetron (ZOFRAN) 4  MG tablet Take 1 tablet (4 mg total) by mouth every 6 (six) hours. 12 tablet 0   traZODone (DESYREL) 100 MG tablet Take 2 tablets (200 mg total) by mouth at bedtime. 60 tablet 3   No current facility-administered medications for this visit.     Musculoskeletal: Strength & Muscle Tone: within normal limits and telehealthvisit Gait & Station: normal,  telephone visit.  Patient leans: N/A  Psychiatric Specialty Exam: Review of Systems  There were no vitals taken for this visit.There is no height or weight on file to calculate BMI.  General Appearance: Well Groomed  Eye Contact:  Good  Speech:  Clear and Coherent and Normal Rate  Volume:  Normal  Mood:  Anxious and Depressed  Affect:  Congruent  Thought Process:  Coherent, Goal Directed and Linear  Orientation:  Full (Time, Place, and Person)  Thought Content: Logical and Hallucinations: Auditory   Suicidal Thoughts:  No  Homicidal Thoughts:  No  Memory:  Immediate;   Good Recent;   Good Remote;   Good  Judgement:  Good  Insight:  Good  Psychomotor Activity:  Normal and Telehealth visit  Concentration:  Concentration: Good and Attention Span: Good  Recall:  Good  Fund of Knowledge: Good  Language: Good  Akathisia:  No  Handed:  Right  AIMS (if indicated):  done, WNL  Assets:  Communication Skills Desire for  Improvement Financial Resources/Insurance Housing  ADL's:  Intact  Cognition: WNL  Sleep:  Fair   Screenings: AIMS    Flowsheet Row Video Visit from 05/19/2022 in [facility] Admission (Discharged) from 01/06/2016 in [facility] INPATIENT BEHAVIORAL MEDICINE  AIMS Total Score 0 0      AUDIT    Flowsheet Row Admission (Discharged) from 05/18/2016 in [facility] INPATIENT BEHAVIORAL MEDICINE Admission (Discharged) from 01/06/2016 in [facility] INPATIENT BEHAVIORAL MEDICINE  Alcohol Use Disorder Identification Test Final Score (AUDIT) 0 0      GAD-7    Flowsheet Row Video Visit from 01/25/2023 in [facility] Video Visit from 11/02/2022 in [facility] Video Visit from 08/15/2022 in [facility] Video Visit from 05/19/2022 in [facility] Video Visit from 02/16/2022 in [facility]  Total GAD-7 Score 16 14 11 21 15       PHQ2-9    Flowsheet Row Video Visit from 01/25/2023 in [facility] Video Visit from 11/02/2022 in [facility] Video Visit from 08/15/2022 in [facility] Video Visit from 05/19/2022 in [facility] Video Visit from 02/16/2022 in [facility]  PHQ-2 Total Score 4 4 4 4 4   PHQ-9 Total Score 15 14 14 17 14       Flowsheet Row Video Visit from 02/18/2021 in [facility] Video Visit from 11/24/2020 in [facility]  C-SSRS RISK CATEGORY Error: Q7 should not be populated when Q6 is No No Risk        Assessment and Plan: Patient endorses symptoms of anxiety, depression, and auditory hallucinations. At this time patient request that medication not be adjusted.  No medication changes made today.  Patient agreeable to continue medication as prescribed.   Patient referred to outpatient counseling for therapy.  No other concerns at this time   1. Generalized anxiety disorder  Continue- busPIRone (BUSPAR) 15 MG tablet; Take 1 tablet (15 mg total) by  mouth 3 (three) times daily.  Dispense: 90 tablet; Refill: 3 Continue- hydrOXYzine (VISTARIL) 50 MG capsule; Take 1 capsule (50 mg total) by mouth every 6 (six) hours as needed for anxiety.  Dispense: 120 capsule; Refill: 3  2. Schizoaffective disorder, bipolar type (HCC)  Continue- busPIRone (BUSPAR) 15 MG tablet; Take 1 tablet (15 mg total) by mouth 3 (three) times daily.  Dispense: 90 tablet; Refill: 3 Continue- citalopram (CELEXA) 20 MG tablet; Take 1 tablet (20 mg total) by mouth daily.  Dispense: 30 tablet; Refill: 3 Continue- divalproex (DEPAKOTE) 500 MG DR tablet; Take 3 tablets (1,500 mg total) by mouth at bedtime.  Dispense: 90 tablet; Refill: 3 Increased- haloperidol (HALDOL) 5 MG tablet; Take 1 tablet (5 mg total) by mouth at bedtime.  Dispense: 30 tablet; Refill: 3 Continue- OLANZapine (ZYPREXA) 20 MG tablet; Take 1 tablet (20 mg total) by mouth at bedtime.  Dispense: 30 tablet; Refill: 3 Continue- traZODone (DESYREL) 100 MG tablet; Take 2 tablets (200 mg total) by mouth at bedtime.  Dispense: 60 tablet; Refill: 3     Follow-up in 2.5 month    Shanna Cisco, NP 01/25/2023, 1:08 PM

## 2023-02-28 ENCOUNTER — Telehealth (HOSPITAL_COMMUNITY): Payer: Self-pay | Admitting: Licensed Clinical Social Worker

## 2023-04-12 ENCOUNTER — Encounter (HOSPITAL_COMMUNITY): Payer: Self-pay | Admitting: Psychiatry

## 2023-04-12 ENCOUNTER — Telehealth (INDEPENDENT_AMBULATORY_CARE_PROVIDER_SITE_OTHER): Payer: Medicare Other | Admitting: Psychiatry

## 2023-04-12 DIAGNOSIS — F250 Schizoaffective disorder, bipolar type: Secondary | ICD-10-CM | POA: Diagnosis not present

## 2023-04-12 DIAGNOSIS — F411 Generalized anxiety disorder: Secondary | ICD-10-CM

## 2023-04-12 MED ORDER — CITALOPRAM HYDROBROMIDE 20 MG PO TABS
20.0000 mg | ORAL_TABLET | Freq: Every day | ORAL | 3 refills | Status: DC
Start: 2023-04-12 — End: 2023-07-05

## 2023-04-12 MED ORDER — TRAZODONE HCL 100 MG PO TABS
200.0000 mg | ORAL_TABLET | Freq: Every day | ORAL | 3 refills | Status: DC
Start: 2023-04-12 — End: 2023-07-05

## 2023-04-12 MED ORDER — OLANZAPINE 20 MG PO TABS
20.0000 mg | ORAL_TABLET | Freq: Every day | ORAL | 3 refills | Status: DC
Start: 2023-04-12 — End: 2023-07-05

## 2023-04-12 MED ORDER — BUSPIRONE HCL 15 MG PO TABS
15.0000 mg | ORAL_TABLET | Freq: Three times a day (TID) | ORAL | 3 refills | Status: DC
Start: 2023-04-12 — End: 2023-07-05

## 2023-04-12 MED ORDER — DIVALPROEX SODIUM 500 MG PO DR TAB
1500.0000 mg | DELAYED_RELEASE_TABLET | Freq: Every evening | ORAL | 3 refills | Status: DC
Start: 2023-04-12 — End: 2023-07-05

## 2023-04-12 MED ORDER — HYDROXYZINE PAMOATE 50 MG PO CAPS
50.0000 mg | ORAL_CAPSULE | Freq: Four times a day (QID) | ORAL | 3 refills | Status: DC | PRN
Start: 2023-04-12 — End: 2023-07-05

## 2023-04-12 NOTE — Progress Notes (Signed)
BH MD/PA/NP OP Progress Note Virtual Visit via Video Note  I connected with Vicki Lee on 04/12/23 at  1:00 PM EDT by a video enabled telemedicine application and verified that I am speaking with the correct person using two identifiers.  Location: Patient: Home Provider: Clinic   I discussed the limitations of evaluation and management by telemedicine and the availability of in person appointments. The patient expressed understanding and agreed to proceed.  I provided 30 minutes of non-face-to-face time during this encounter.             04/12/2023 1:21 PM Vicki Lee  MRN:  [id number]  Chief Complaint: "I have been doing the same"  HPI: 42 year-old female seen today for follow up psychiatric evaluation. Patient has a psychiatric history of schizophrenia, depression, and anxiety. She is currently managed on Depakote 1500 mg nightly, Buspar 15 mg TID, Haldol 5 mg trazodone 200 mg nightly, Zyprexa 20 mg nightly, Hydroxyzine 50 mg four times daily, and Celexa 20 mg daily. Today patient notes that medications are somewhat effective in managing her symptoms.   Today she was well-groomed, pleasant, cooperative, engaged in conversation, and maintained eye contact.  She informed Clinical research associate that things have been the same. She informed Clinical research associate that her AH continue to cause her distress. She notes that they tell her to do things before she does them which causes her anxiety. Despite this she informed Clinical research associate that she continues to push forward. She notes that she has been dieting and exercising. Since her last visit she notes that she has lost 10 pounds. She notes that her dieting and exercise has positivity impacted her anxiety and depression. Today provider conducted a GAD-7 and patient scored a 12, at her last visit she scored a 16.  Provider also conducted PHQ-9 patient scored a 12, at her last visit she scored a 15.  She endorses adequate sleep and increased appetite.  Today she denies  SI/HI/VA, mania or paranoia.  Patient notes that recently she got dizzy. She notes that her dizziness just happened once. She denies overheating or low food/water intake.  At this time patient request that medication not be adjusted.  No medication changes made today.  Patient agreeable to continue medication as prescribed. Provider ordered LFT, Depakot level, CBC, UDS, THS, and prolactin level. No other concerns at this time. Visit Diagnosis:    ICD-10-CM   1. Generalized anxiety disorder  F41.1 busPIRone (BUSPAR) 15 MG tablet    hydrOXYzine (VISTARIL) 50 MG capsule    2. Schizoaffective disorder, bipolar type (HCC)  F25.0 busPIRone (BUSPAR) 15 MG tablet    citalopram (CELEXA) 20 MG tablet    divalproex (DEPAKOTE) 500 MG DR tablet    Valproic Acid level    CBC w/Diff/Platelet    Hepatic function panel    Rapid urine drug screen (hospital performed)    Prolactin    OLANZapine (ZYPREXA) 20 MG tablet    traZODone (DESYREL) 100 MG tablet       Past Psychiatric History: Anxiety, depression, and schizophrenia Past Medical History:  Past Medical History:  Diagnosis Date   Anxiety    Crohn disease (HCC)    Depression    Fibroid    Infertility, female     Past Surgical History:  Procedure Laterality Date   COLON SURGERY  2000    due to Chron's age 21.   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   MYOMECTOMY  2019    Family Psychiatric  History: Unknown  Family History:  Family History  Problem Relation Age of Onset   Hyperlipidemia Mother    Hypertension Mother    Crohn's disease Father    Crohn's disease Brother    Mental illness Maternal Grandmother    Bone cancer Maternal Grandfather    Mental illness Paternal Grandmother     Social History:  Social History   Socioeconomic History   Marital status: Married    Spouse name: Not on file   Number of children: Not on file   Years of education: Not on file   Highest education level: Not on file  Occupational History    Not on file  Tobacco Use   Smoking status: Never   Smokeless tobacco: Never  Vaping Use   Vaping status: Never Used  Substance and Sexual Activity   Alcohol use: No   Drug use: No   Sexual activity: Yes    Partners: Male    Birth control/protection: None  Other Topics Concern   Not on file  Social History Narrative   Not on file   Social Determinants of Health   Financial Resource Strain: Not on file  Food Insecurity: Not on file  Transportation Needs: Not on file  Physical Activity: Not on file  Stress: Not on file  Social Connections: Not on file    Allergies: Not on File  Metabolic Disorder Labs: Lab Results  Component Value Date   HGBA1C 5.4 08/01/2018   Lab Results  Component Value Date   PROLACTIN 171.3 (H) 05/19/2016   PROLACTIN 159.4 (H) 01/07/2016   Lab Results  Component Value Date   CHOL 196 05/29/2018   TRIG 177 (H) 05/29/2018   HDL 39 (L) 05/29/2018   CHOLHDL 5.0 (H) 05/29/2018   VLDL 19 05/19/2016   LDLCALC 122 (H) 05/29/2018   LDLCALC 129 (H) 05/19/2016   Lab Results  Component Value Date   TSH 2.384 05/19/2016   TSH 2.461 01/07/2016    Therapeutic Level Labs: No results found for: "LITHIUM" No results found for: "VALPROATE" No results found for: "CBMZ"  Current Medications: Current Outpatient Medications  Medication Sig Dispense Refill   busPIRone (BUSPAR) 15 MG tablet Take 1 tablet (15 mg total) by mouth 3 (three) times daily. 90 tablet 3   citalopram (CELEXA) 20 MG tablet Take 1 tablet (20 mg total) by mouth daily. 30 tablet 3   divalproex (DEPAKOTE) 500 MG DR tablet Take 3 tablets (1,500 mg total) by mouth at bedtime. 90 tablet 3   haloperidol (HALDOL) 5 MG tablet Take 1 tablet (5 mg total) by mouth at bedtime. 30 tablet 3   HYDROcodone-acetaminophen (NORCO/VICODIN) 5-325 MG tablet Take 1-2 tablets by mouth every 6 (six) hours as needed for severe pain. 12 tablet 0   hydrOXYzine (VISTARIL) 50 MG capsule Take 1 capsule (50 mg  total) by mouth every 6 (six) hours as needed for anxiety. 120 capsule 3   Multiple Vitamins-Minerals (MULTIVITAMIN PO) Take by mouth.     OLANZapine (ZYPREXA) 20 MG tablet Take 1 tablet (20 mg total) by mouth at bedtime. 30 tablet 3   ondansetron (ZOFRAN) 4 MG tablet Take 1 tablet (4 mg total) by mouth every 6 (six) hours. 12 tablet 0   traZODone (DESYREL) 100 MG tablet Take 2 tablets (200 mg total) by mouth at bedtime. 60 tablet 3   No current facility-administered medications for this visit.     Musculoskeletal: Strength & Muscle Tone: within normal limits and telehealthvisit Gait &  Station: normal,  telephone visit.  Patient leans: N/A  Psychiatric Specialty Exam: Review of Systems  There were no vitals taken for this visit.There is no height or weight on file to calculate BMI.  General Appearance: Well Groomed  Eye Contact:  Good  Speech:  Clear and Coherent and Normal Rate  Volume:  Normal  Mood:  Anxious and Depressed  Affect:  Congruent  Thought Process:  Coherent, Goal Directed and Linear  Orientation:  Full (Time, Place, and Person)  Thought Content: Logical and Hallucinations: Auditory   Suicidal Thoughts:  No  Homicidal Thoughts:  No  Memory:  Immediate;   Good Recent;   Good Remote;   Good  Judgement:  Good  Insight:  Good  Psychomotor Activity:  Normal and Telehealth visit  Concentration:  Concentration: Good and Attention Span: Good  Recall:  Good  Fund of Knowledge: Good  Language: Good  Akathisia:  No  Handed:  Right  AIMS (if indicated): not done  Assets:  Communication Skills Desire for Improvement Financial Resources/Insurance Housing  ADL's:  Intact  Cognition: WNL  Sleep:  Good   Screenings: AIMS    Flowsheet Row Video Visit from 05/19/2022 in [facility] Admission (Discharged) from 01/06/2016 in [facility] INPATIENT BEHAVIORAL MEDICINE  AIMS Total Score 0 0      AUDIT    Flowsheet Row Admission (Discharged) from  05/18/2016 in [facility] INPATIENT BEHAVIORAL MEDICINE Admission (Discharged) from 01/06/2016 in [facility] INPATIENT BEHAVIORAL MEDICINE  Alcohol Use Disorder Identification Test Final Score (AUDIT) 0 0      GAD-7    Flowsheet Row Video Visit from 04/12/2023 in [facility] Video Visit from 01/25/2023 in [facility] Video Visit from 11/02/2022 in [facility] Video Visit from 08/15/2022 in [facility] Video Visit from 05/19/2022 in [facility]  Total GAD-7 Score 12 16 14 11 21       PHQ2-9    Flowsheet Row Video Visit from 04/12/2023 in [facility] Video Visit from 01/25/2023 in [facility] Video Visit from 11/02/2022 in [facility] Video Visit from 08/15/2022 in [facility] Video Visit from 05/19/2022 in [facility]  PHQ-2 Total Score 5 4 4 4 4   PHQ-9 Total Score 12 15 14 14 17       Flowsheet Row Video Visit from 02/18/2021 in [facility] Video Visit from 11/24/2020 in [facility]  C-SSRS RISK CATEGORY Error: Q7 should not be populated when Q6 is No No Risk        Assessment and Plan: Patient endorses auditory hallucinations and dizziness. She notes her anxiety and depression has improved with diet and exercise. At this time patient request that medication not be adjusted.  No medication changes made today.  Patient agreeable to continue medication as prescribed. Provider ordered LFT, Depakot level, CBC, UDS, THS, and prolactin level.    1. Generalized anxiety disorder  Continue- busPIRone (BUSPAR) 15 MG tablet; Take 1 tablet (15 mg total) by mouth 3 (three) times daily.  Dispense: 90 tablet; Refill: 3 Continue- hydrOXYzine (VISTARIL) 50 MG capsule; Take 1 capsule (50 mg  total) by mouth every 6 (six) hours as needed for anxiety.  Dispense: 120 capsule; Refill: 3  2. Schizoaffective disorder, bipolar type (HCC)  Continue- busPIRone (BUSPAR) 15 MG tablet; Take 1 tablet (15  mg total) by mouth 3 (three) times daily.  Dispense: 90 tablet; Refill: 3 Continue- citalopram (CELEXA) 20 MG tablet; Take 1 tablet (20 mg total) by mouth daily.  Dispense: 30 tablet; Refill: 3 Continue- divalproex (DEPAKOTE) 500 MG DR tablet; Take 3 tablets (1,500 mg total) by mouth at bedtime.  Dispense: 90 tablet; Refill: 3 - Valproic Acid level - CBC w/Diff/Platelet - Hepatic function panel - Rapid urine drug screen (hospital performed); Future - Prolactin Continue- OLANZapine (ZYPREXA) 20 MG tablet; Take 1 tablet (20 mg total) by mouth at bedtime.  Dispense: 30 tablet; Refill: 3 Continue- traZODone (DESYREL) 100 MG tablet; Take 2 tablets (200 mg total) by mouth at bedtime.  Dispense: 60 tablet; Refill: 3    Follow-up in 3 month    Shanna Cisco, NP 04/12/2023, 1:21 PM

## 2023-04-13 ENCOUNTER — Telehealth (HOSPITAL_COMMUNITY): Payer: Self-pay | Admitting: *Deleted

## 2023-04-13 NOTE — Telephone Encounter (Signed)
Patient called states she is hearing voices, command voices. When asked to describe voices she states "Brittney knows, I just talked to her yesterday." Denies SI/HI. States she also has ringing in her ears.

## 2023-04-14 NOTE — Telephone Encounter (Signed)
Provider attempted to call the patient 5 times without success.  Provider left a voicemail informing patient that medications could be adjusted if she would like.  Provider also informed patient that she could come into the urgent care if needed or call the provider further questions or concerns.

## 2023-07-05 ENCOUNTER — Telehealth (INDEPENDENT_AMBULATORY_CARE_PROVIDER_SITE_OTHER): Payer: Medicare Other | Admitting: Psychiatry

## 2023-07-05 ENCOUNTER — Encounter (HOSPITAL_COMMUNITY): Payer: Self-pay | Admitting: Psychiatry

## 2023-07-05 DIAGNOSIS — F411 Generalized anxiety disorder: Secondary | ICD-10-CM | POA: Diagnosis not present

## 2023-07-05 DIAGNOSIS — F250 Schizoaffective disorder, bipolar type: Secondary | ICD-10-CM

## 2023-07-05 MED ORDER — HYDROXYZINE PAMOATE 50 MG PO CAPS
50.0000 mg | ORAL_CAPSULE | Freq: Four times a day (QID) | ORAL | 3 refills | Status: DC | PRN
Start: 2023-07-05 — End: 2023-09-20

## 2023-07-05 MED ORDER — CITALOPRAM HYDROBROMIDE 20 MG PO TABS
20.0000 mg | ORAL_TABLET | Freq: Every day | ORAL | 3 refills | Status: DC
Start: 2023-07-05 — End: 2023-09-20

## 2023-07-05 MED ORDER — BUSPIRONE HCL 15 MG PO TABS: 15.0000 mg | ORAL_TABLET | Freq: Three times a day (TID) | ORAL | 3 refills | Status: DC

## 2023-07-05 MED ORDER — OLANZAPINE 20 MG PO TABS: 20.0000 mg | ORAL_TABLET | Freq: Every day | ORAL | 3 refills | Status: DC

## 2023-07-05 MED ORDER — HALOPERIDOL 5 MG PO TABS: 5.0000 mg | ORAL_TABLET | Freq: Every evening | ORAL | 3 refills | Status: DC

## 2023-07-05 MED ORDER — DIVALPROEX SODIUM 500 MG PO DR TAB
1500.0000 mg | DELAYED_RELEASE_TABLET | Freq: Every evening | ORAL | 3 refills | Status: DC
Start: 2023-07-05 — End: 2023-09-20

## 2023-07-05 MED ORDER — TRAZODONE HCL 100 MG PO TABS: 200.0000 mg | ORAL_TABLET | Freq: Every day | ORAL | 3 refills | Status: DC

## 2023-07-05 NOTE — Progress Notes (Signed)
BH MD/PA/NP OP Progress Note Virtual Visit via Video Note  I connected with Vicki Lee on 07/05/23 at  1:00 PM EDT by a video enabled telemedicine application and verified that I am speaking with the correct person using two identifiers.  Location: Patient: Home Provider: Clinic   I discussed the limitations of evaluation and management by telemedicine and the availability of in person appointments. The patient expressed understanding and agreed to proceed.  I provided 30 minutes of non-face-to-face time during this encounter.             07/05/2023 1:01 PM Vicki Lee  MRN:  [id number]  Chief Complaint: "I am down another 5 or 6 ponds"  HPI: 42 year-old female seen today for follow up psychiatric evaluation. Patient has a psychiatric history of schizophrenia, depression, and anxiety. She is currently managed on Depakote 1500 mg nightly, Buspar 15 mg TID, Haldol 5 mg trazodone 200 mg nightly, Zyprexa 20 mg nightly, Hydroxyzine 50 mg four times daily, and Celexa 20 mg daily. Today patient notes that medications are effective in managing her symptoms.   Today she was well-groomed, pleasant, cooperative, engaged in conversation, and maintained eye contact.  She informed Clinical research associate that to cope with life stressors she has been walking daily with her mother and sister.  She notes that she is down 6 pounds.  Patient informed Clinical research associate that she continues to hear auditory hallucinations instructing her to do things before she does them.  She informed Clinical research associate that she does not wish to live with these hallucinations but reports that overall she is able to cope.  Patient notes that her anxiety and depression are better managed since her last visit.  Today provider conducted a GAD-7 and patient scored a 10, at her last visit she scored a 12.  Provider also conducted PHQ-9 patient scored a 10, at her last visit she scored a 12.  She endorses adequate sleep and increased appetite.  Today she denies  SI/HI/VA, mania or paranoia.    At this time patient request that medication not be adjusted.  No medication changes made today.  Patient agreeable to continue medication as prescribed. Provider ordered LFT, Depakot level, CBC, UDS, THS, and prolactin level. No other concerns at this time. Visit Diagnosis:    ICD-10-CM   1. Generalized anxiety disorder  F41.1 busPIRone (BUSPAR) 15 MG tablet    hydrOXYzine (VISTARIL) 50 MG capsule    2. Schizoaffective disorder, bipolar type (HCC)  F25.0 busPIRone (BUSPAR) 15 MG tablet    citalopram (CELEXA) 20 MG tablet    divalproex (DEPAKOTE) 500 MG DR tablet    haloperidol (HALDOL) 5 MG tablet    OLANZapine (ZYPREXA) 20 MG tablet    traZODone (DESYREL) 100 MG tablet        Past Psychiatric History: Anxiety, depression, and schizophrenia Past Medical History:  Past Medical History:  Diagnosis Date   Anxiety    Crohn disease (HCC)    Depression    Fibroid    Infertility, female     Past Surgical History:  Procedure Laterality Date   COLON SURGERY  2000    due to Chron's age 26.   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   MYOMECTOMY  2019    Family Psychiatric History: Unknown  Family History:  Family History  Problem Relation Age of Onset   Hyperlipidemia Mother    Hypertension Mother    Crohn's disease Father    Crohn's disease Brother  Mental illness Maternal Grandmother    Bone cancer Maternal Grandfather    Mental illness Paternal Grandmother     Social History:  Social History   Socioeconomic History   Marital status: Married    Spouse name: Not on file   Number of children: Not on file   Years of education: Not on file   Highest education level: Not on file  Occupational History   Not on file  Tobacco Use   Smoking status: Never   Smokeless tobacco: Never  Vaping Use   Vaping status: Never Used  Substance and Sexual Activity   Alcohol use: No   Drug use: No   Sexual activity: Yes    Partners: Male     Birth control/protection: None  Other Topics Concern   Not on file  Social History Narrative   Not on file   Social Determinants of Health   Financial Resource Strain: Not on file  Food Insecurity: Not on file  Transportation Needs: Not on file  Physical Activity: Not on file  Stress: Not on file  Social Connections: Not on file    Allergies: Not on File  Metabolic Disorder Labs: Lab Results  Component Value Date   HGBA1C 5.4 08/01/2018   Lab Results  Component Value Date   PROLACTIN 171.3 (H) 05/19/2016   PROLACTIN 159.4 (H) 01/07/2016   Lab Results  Component Value Date   CHOL 196 05/29/2018   TRIG 177 (H) 05/29/2018   HDL 39 (L) 05/29/2018   CHOLHDL 5.0 (H) 05/29/2018   VLDL 19 05/19/2016   LDLCALC 122 (H) 05/29/2018   LDLCALC 129 (H) 05/19/2016   Lab Results  Component Value Date   TSH 2.384 05/19/2016   TSH 2.461 01/07/2016    Therapeutic Level Labs: No results found for: "LITHIUM" No results found for: "VALPROATE" No results found for: "CBMZ"  Current Medications: Current Outpatient Medications  Medication Sig Dispense Refill   busPIRone (BUSPAR) 15 MG tablet Take 1 tablet (15 mg total) by mouth 3 (three) times daily. 90 tablet 3   citalopram (CELEXA) 20 MG tablet Take 1 tablet (20 mg total) by mouth daily. 30 tablet 3   divalproex (DEPAKOTE) 500 MG DR tablet Take 3 tablets (1,500 mg total) by mouth at bedtime. 90 tablet 3   haloperidol (HALDOL) 5 MG tablet Take 1 tablet (5 mg total) by mouth at bedtime. 30 tablet 3   HYDROcodone-acetaminophen (NORCO/VICODIN) 5-325 MG tablet Take 1-2 tablets by mouth every 6 (six) hours as needed for severe pain. 12 tablet 0   hydrOXYzine (VISTARIL) 50 MG capsule Take 1 capsule (50 mg total) by mouth every 6 (six) hours as needed for anxiety. 120 capsule 3   Multiple Vitamins-Minerals (MULTIVITAMIN PO) Take by mouth.     OLANZapine (ZYPREXA) 20 MG tablet Take 1 tablet (20 mg total) by mouth at bedtime. 30 tablet 3    ondansetron (ZOFRAN) 4 MG tablet Take 1 tablet (4 mg total) by mouth every 6 (six) hours. 12 tablet 0   traZODone (DESYREL) 100 MG tablet Take 2 tablets (200 mg total) by mouth at bedtime. 60 tablet 3   No current facility-administered medications for this visit.     Musculoskeletal: Strength & Muscle Tone: within normal limits and telehealthvisit Gait & Station: normal,  telehealth visit.  Patient leans: N/A  Psychiatric Specialty Exam: Review of Systems  There were no vitals taken for this visit.There is no height or weight on file to calculate BMI.  General Appearance:  Well Groomed  Eye Contact:  Good  Speech:  Clear and Coherent and Normal Rate  Volume:  Normal  Mood:  Euthymic  Affect:  Congruent  Thought Process:  Coherent, Goal Directed and Linear  Orientation:  Full (Time, Place, and Person)  Thought Content: Logical and Hallucinations: Auditory   Suicidal Thoughts:  No  Homicidal Thoughts:  No  Memory:  Immediate;   Good Recent;   Good Remote;   Good  Judgement:  Good  Insight:  Good  Psychomotor Activity:  Normal and Telehealth visit  Concentration:  Concentration: Good and Attention Span: Good  Recall:  Good  Fund of Knowledge: Good  Language: Good  Akathisia:  No  Handed:  Right  AIMS (if indicated): not done  Assets:  Communication Skills Desire for Improvement Financial Resources/Insurance Housing  ADL's:  Intact  Cognition: WNL  Sleep:  Good   Screenings: AIMS    Flowsheet Row Video Visit from 05/19/2022 in [facility] Admission (Discharged) from 01/06/2016 in [facility] INPATIENT BEHAVIORAL MEDICINE  AIMS Total Score 0 0      AUDIT    Flowsheet Row Admission (Discharged) from 05/18/2016 in [facility] INPATIENT BEHAVIORAL MEDICINE Admission (Discharged) from 01/06/2016 in [facility] INPATIENT BEHAVIORAL MEDICINE  Alcohol Use Disorder Identification Test Final Score (AUDIT) 0 0      GAD-7    Flowsheet Row Video Visit from 07/05/2023  in [facility] Video Visit from 04/12/2023 in [facility] Video Visit from 01/25/2023 in [facility] Video Visit from 11/02/2022 in [facility] Video Visit from 08/15/2022 in [facility]  Total GAD-7 Score 10 12 16 14 11       PHQ2-9    Flowsheet Row Video Visit from 07/05/2023 in [facility] Video Visit from 04/12/2023 in [facility] Video Visit from 01/25/2023 in [facility] Video Visit from 11/02/2022 in [facility] Video Visit from 08/15/2022 in [facility]  PHQ-2 Total Score 2 5 4 4 4   PHQ-9 Total Score 10 12 15 14 14       Flowsheet Row Video Visit from 02/18/2021 in [facility] Video Visit from 11/24/2020 in [facility]  C-SSRS RISK CATEGORY Error: Q7 should not be populated when Q6 is No No Risk        Assessment and Plan: Patient endorses auditory hallucinations. No medication changes made today.  Patient agreeable to continue medication as prescribed.   1. Generalized anxiety disorder  Continue- busPIRone (BUSPAR) 15 MG tablet; Take 1 tablet (15 mg total) by mouth 3 (three) times daily.  Dispense: 90 tablet; Refill: 3 Continue- hydrOXYzine (VISTARIL) 50 MG capsule; Take 1 capsule (50 mg total) by mouth every 6 (six) hours as needed for anxiety.  Dispense: 120 capsule; Refill: 3  2. Schizoaffective disorder, bipolar type (HCC)  Continue- busPIRone (BUSPAR) 15 MG tablet; Take 1 tablet (15 mg total) by mouth 3 (three) times daily.  Dispense: 90 tablet; Refill: 3 Continue- citalopram (CELEXA) 20 MG tablet; Take 1 tablet (20 mg total) by mouth daily.  Dispense: 30 tablet; Refill: 3 Continue- divalproex (DEPAKOTE) 500 MG DR tablet; Take 3 tablets (1,500  mg total) by mouth at bedtime.  Dispense: 90 tablet; Refill: 3 Continue- haloperidol (HALDOL) 5 MG tablet; Take 1 tablet (5 mg total) by mouth at bedtime.  Dispense: 30  tablet; Refill: 3 Continue- OLANZapine (ZYPREXA) 20 MG tablet; Take 1 tablet (20 mg total) by mouth at bedtime.  Dispense: 30 tablet; Refill: 3 Continue- traZODone (DESYREL) 100 MG tablet; Take 2 tablets (200 mg total) by mouth at bedtime.  Dispense: 60 tablet; Refill: 3     Follow-up in 3 month    Shanna Cisco, NP 07/05/2023, 1:01 PM

## 2023-09-20 ENCOUNTER — Encounter (HOSPITAL_COMMUNITY): Payer: Self-pay | Admitting: Psychiatry

## 2023-09-20 ENCOUNTER — Telehealth (HOSPITAL_COMMUNITY): Payer: Medicare (Managed Care) | Admitting: Psychiatry

## 2023-09-20 DIAGNOSIS — F411 Generalized anxiety disorder: Secondary | ICD-10-CM | POA: Diagnosis not present

## 2023-09-20 DIAGNOSIS — F250 Schizoaffective disorder, bipolar type: Secondary | ICD-10-CM | POA: Diagnosis not present

## 2023-09-20 MED ORDER — BUSPIRONE HCL 15 MG PO TABS: 15.0000 mg | ORAL_TABLET | Freq: Three times a day (TID) | ORAL | 3 refills | Status: DC

## 2023-09-20 MED ORDER — OLANZAPINE 20 MG PO TABS: 20.0000 mg | ORAL_TABLET | Freq: Every day | ORAL | 3 refills | Status: DC

## 2023-09-20 MED ORDER — HALOPERIDOL 5 MG PO TABS: 5.0000 mg | ORAL_TABLET | Freq: Every evening | ORAL | 3 refills | Status: DC

## 2023-09-20 MED ORDER — TRAZODONE HCL 100 MG PO TABS: 200.0000 mg | ORAL_TABLET | Freq: Every day | ORAL | 3 refills | Status: DC

## 2023-09-20 MED ORDER — HYDROXYZINE PAMOATE 50 MG PO CAPS
50.0000 mg | ORAL_CAPSULE | Freq: Four times a day (QID) | ORAL | 3 refills | Status: DC | PRN
Start: 2023-09-20 — End: 2023-11-29

## 2023-09-20 MED ORDER — DIVALPROEX SODIUM 500 MG PO DR TAB: 1500.0000 mg | DELAYED_RELEASE_TABLET | Freq: Every evening | ORAL | 3 refills | Status: DC

## 2023-09-20 MED ORDER — CITALOPRAM HYDROBROMIDE 20 MG PO TABS: 20.0000 mg | ORAL_TABLET | Freq: Every day | ORAL | 3 refills | Status: DC

## 2023-09-20 NOTE — Progress Notes (Signed)
 BH MD/PA/NP OP Progress Note Virtual Visit via Video Note  I connected with Vicki Lee on 09/20/23 at 10:30 AM EST by a video enabled telemedicine application and verified that I am speaking with the correct person using two identifiers.  Location: Patient: Home Provider: Clinic   I discussed the limitations of evaluation and management by telemedicine and the availability of in person appointments. The patient expressed understanding and agreed to proceed.  I provided 30 minutes of non-face-to-face time during this encounter.             09/20/2023 10:50 AM Vicki Lee  MRN:  [id number]  Chief Complaint: I am doing the same  HPI: 43 year-old female seen today for follow up psychiatric evaluation. Patient has a psychiatric history of schizophrenia, depression, and anxiety. She is currently managed on Depakote  1500 mg nightly, Buspar  15 mg TID, Haldol  5 mg trazodone  200 mg nightly, Zyprexa  20 mg nightly, Hydroxyzine  50 mg four times daily, and Celexa  20 mg daily. Today patient notes that medications are effective in managing her symptoms.   Today she was well-groomed, pleasant, cooperative, engaged in conversation, and maintained eye contact.  She informed clinical research associate that she is doing the same.  She reports that she continues to have her hallucinations but is coping with it by trusting God.  Provider informed patient that her medication could be adjusted to see if they would reduce her hallucinations however she was not agreeable.    Since her last visit she informed writer that she continues to have minimal anxiety and depression.  Today provider conducted a GAD-7 and patient scored a 7, at her last visit she scored 10.  Provider also conducted a PHQ-9 patient scored an 8, at her last visit she scored a 10.  She endorses adequate sleep and appetite.    At this time patient request that medication not be adjusted.  No medication changes made today.  Patient agreeable to  continue medication as prescribed. Provider discussed recent labs with patient.  Patient prolactin level did not result.  Provider informed her that this and other labs will be reordered in the near future.  She endorsed understanding and agreed.   Visit Diagnosis:    ICD-10-CM   1. Generalized anxiety disorder  F41.1 busPIRone  (BUSPAR ) 15 MG tablet    hydrOXYzine  (VISTARIL ) 50 MG capsule    2. Schizoaffective disorder, bipolar type (HCC)  F25.0 busPIRone  (BUSPAR ) 15 MG tablet    citalopram  (CELEXA ) 20 MG tablet    divalproex  (DEPAKOTE ) 500 MG DR tablet    haloperidol  (HALDOL ) 5 MG tablet    traZODone  (DESYREL ) 100 MG tablet    OLANZapine  (ZYPREXA ) 20 MG tablet         Past Psychiatric History: Anxiety, depression, and schizophrenia Past Medical History:  Past Medical History:  Diagnosis Date   Anxiety    Crohn disease (HCC)    Depression    Fibroid    Infertility, female     Past Surgical History:  Procedure Laterality Date   COLON SURGERY  2000    due to Chron's age 61.   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   MYOMECTOMY  2019    Family Psychiatric History: Unknown  Family History:  Family History  Problem Relation Age of Onset   Hyperlipidemia Mother    Hypertension Mother    Crohn's disease Father    Crohn's disease Brother    Mental illness Maternal Grandmother    Bone  cancer Maternal Grandfather    Mental illness Paternal Grandmother     Social History:  Social History   Socioeconomic History   Marital status: Married    Spouse name: Not on file   Number of children: Not on file   Years of education: Not on file   Highest education level: Not on file  Occupational History   Not on file  Tobacco Use   Smoking status: Never   Smokeless tobacco: Never  Vaping Use   Vaping status: Never Used  Substance and Sexual Activity   Alcohol use: No   Drug use: No   Sexual activity: Yes    Partners: Male    Birth control/protection: None  Other  Topics Concern   Not on file  Social History Narrative   Not on file   Social Drivers of Health   Financial Resource Strain: Not on file  Food Insecurity: Not on file  Transportation Needs: Not on file  Physical Activity: Not on file  Stress: Not on file  Social Connections: Not on file    Allergies: Not on File  Metabolic Disorder Labs: Lab Results  Component Value Date   HGBA1C 5.4 08/01/2018   Lab Results  Component Value Date   PROLACTIN 171.3 (H) 05/19/2016   PROLACTIN 159.4 (H) 01/07/2016   Lab Results  Component Value Date   CHOL 196 05/29/2018   TRIG 177 (H) 05/29/2018   HDL 39 (L) 05/29/2018   CHOLHDL 5.0 (H) 05/29/2018   VLDL 19 05/19/2016   LDLCALC 122 (H) 05/29/2018   LDLCALC 129 (H) 05/19/2016   Lab Results  Component Value Date   TSH 2.384 05/19/2016   TSH 2.461 01/07/2016    Therapeutic Level Labs: No results found for: LITHIUM No results found for: VALPROATE No results found for: CBMZ  Current Medications: Current Outpatient Medications  Medication Sig Dispense Refill   busPIRone  (BUSPAR ) 15 MG tablet Take 1 tablet (15 mg total) by mouth 3 (three) times daily. 90 tablet 3   citalopram  (CELEXA ) 20 MG tablet Take 1 tablet (20 mg total) by mouth daily. 30 tablet 3   divalproex  (DEPAKOTE ) 500 MG DR tablet Take 3 tablets (1,500 mg total) by mouth at bedtime. 90 tablet 3   haloperidol  (HALDOL ) 5 MG tablet Take 1 tablet (5 mg total) by mouth at bedtime. 30 tablet 3   HYDROcodone -acetaminophen  (NORCO/VICODIN) 5-325 MG tablet Take 1-2 tablets by mouth every 6 (six) hours as needed for severe pain. 12 tablet 0   hydrOXYzine  (VISTARIL ) 50 MG capsule Take 1 capsule (50 mg total) by mouth every 6 (six) hours as needed for anxiety. 120 capsule 3   Multiple Vitamins-Minerals (MULTIVITAMIN PO) Take by mouth.     OLANZapine  (ZYPREXA ) 20 MG tablet Take 1 tablet (20 mg total) by mouth at bedtime. 30 tablet 3   ondansetron  (ZOFRAN ) 4 MG tablet Take 1  tablet (4 mg total) by mouth every 6 (six) hours. 12 tablet 0   traZODone  (DESYREL ) 100 MG tablet Take 2 tablets (200 mg total) by mouth at bedtime. 60 tablet 3   No current facility-administered medications for this visit.     Musculoskeletal: Strength & Muscle Tone: within normal limits and telehealthvisit Gait & Station: normal,  telehealth visit.  Patient leans: N/A  Psychiatric Specialty Exam: Review of Systems  There were no vitals taken for this visit.There is no height or weight on file to calculate BMI.  General Appearance: Well Groomed  Eye Contact:  Good  Speech:  Clear and Coherent and Normal Rate  Volume:  Normal  Mood:  Euthymic  Affect:  Congruent  Thought Process:  Coherent, Goal Directed and Linear  Orientation:  Full (Time, Place, and Person)  Thought Content: Logical and Hallucinations: Auditory   Suicidal Thoughts:  No  Homicidal Thoughts:  No  Memory:  Immediate;   Good Recent;   Good Remote;   Good  Judgement:  Good  Insight:  Good  Psychomotor Activity:  Normal and Telehealth visit  Concentration:  Concentration: Good and Attention Span: Good  Recall:  Good  Fund of Knowledge: Good  Language: Good  Akathisia:  No  Handed:  Right  AIMS (if indicated): not done  Assets:  Communication Skills Desire for Improvement Financial Resources/Insurance Housing  ADL's:  Intact  Cognition: WNL  Sleep:  Good   Screenings: AIMS    Flowsheet Row Video Visit from 05/19/2022 in [facility] Admission (Discharged) from 01/06/2016 in [facility] INPATIENT BEHAVIORAL MEDICINE  AIMS Total Score 0 0      AUDIT    Flowsheet Row Admission (Discharged) from 05/18/2016 in [facility] INPATIENT BEHAVIORAL MEDICINE Admission (Discharged) from 01/06/2016 in [facility] INPATIENT BE[facility]IORAL MEDICINE  Alcohol Use Disorder Identification Test Final Score (AUDIT) 0 0      GAD-7    Flowsheet Row Video Visit from 09/20/2023 in [facility]  Video Visit from 07/05/2023 in [facility] Video Visit from 04/12/2023 in [facility] Video Visit from 01/25/2023 in [facility] Video Visit from 11/02/2022 in [facility]  Total GAD-7 Score 7 10 12 16 14       PHQ2-9    Flowsheet Row Video Visit from 09/20/2023 in [facility] Video Visit from 07/05/2023 in [facility] Video Visit from 04/12/2023 in [facility] Video Visit from 01/25/2023 in [facility] Video Visit from 11/02/2022 in [facility]  PHQ-2 Total Score 2 2 5 4 4   PHQ-9 Total Score 8 10 12 15 14       Flowsheet Row Video Visit from 02/18/2021 in [facility] Video Visit from 11/24/2020 in [facility]  C-SSRS RISK CATEGORY Error: Q7 should not be populated when Q6 is No No Risk        Assessment and Plan: Patient endorses auditory hallucinations. At this time patient request that medication not be adjusted.  No medication changes made today.  Patient agreeable to continue medication as prescribed. Provider discussed recent labs with patient.  Patient prolactin level did not result.  Provider informed her that this and other labs will be reordered in the near future.  She endorsed understanding and agreed. 1. Generalized anxiety disorder  Continue- busPIRone  (BUSPAR ) 15 MG tablet; Take 1 tablet (15 mg total) by mouth 3 (three) times daily.  Dispense: 90 tablet; Refill: 3 Continue- hydrOXYzine  (VISTARIL ) 50 MG capsule; Take 1 capsule (50 mg total) by mouth every 6 (six) hours as needed for anxiety.  Dispense: 120 capsule; Refill: 3  2. Schizoaffective disorder, bipolar type (HCC)  Continue- busPIRone  (BUSPAR ) 15 MG tablet; Take 1 tablet (15 mg total) by mouth 3 (three) times daily.   Dispense: 90 tablet; Refill: 3 Continue- citalopram  (CELEXA ) 20 MG tablet; Take 1 tablet (20 mg total) by mouth daily.  Dispense: 30 tablet; Refill: 3 Continue- divalproex  (DEPAKOTE ) 500 MG DR  tablet; Take 3 tablets (1,500 mg total) by mouth at bedtime.  Dispense: 90 tablet; Refill: 3 Continue- haloperidol  (HALDOL ) 5 MG tablet; Take 1 tablet (5 mg total) by mouth at bedtime.  Dispense: 30 tablet; Refill: 3 Continue- OLANZapine  (ZYPREXA ) 20 MG tablet; Take 1 tablet (20 mg total) by mouth at bedtime.  Dispense: 30 tablet; Refill: 3 Continue- traZODone  (DESYREL ) 100 MG tablet; Take 2 tablets (200 mg total) by mouth at bedtime.  Dispense: 60 tablet; Refill: 3     Follow-up in 2.5 month    Zane FORBES Bach, NP 09/20/2023, 10:50 AM

## 2023-11-29 ENCOUNTER — Encounter (HOSPITAL_COMMUNITY): Payer: Self-pay | Admitting: Psychiatry

## 2023-11-29 ENCOUNTER — Telehealth (INDEPENDENT_AMBULATORY_CARE_PROVIDER_SITE_OTHER): Payer: Medicare (Managed Care) | Admitting: Psychiatry

## 2023-11-29 DIAGNOSIS — F411 Generalized anxiety disorder: Secondary | ICD-10-CM

## 2023-11-29 DIAGNOSIS — F250 Schizoaffective disorder, bipolar type: Secondary | ICD-10-CM | POA: Diagnosis not present

## 2023-11-29 MED ORDER — OLANZAPINE 20 MG PO TABS: 20.0000 mg | ORAL_TABLET | Freq: Every day | ORAL | 3 refills | Status: DC

## 2023-11-29 MED ORDER — HYDROXYZINE PAMOATE 50 MG PO CAPS: 50.0000 mg | ORAL_CAPSULE | Freq: Four times a day (QID) | ORAL | 3 refills | Status: DC | PRN

## 2023-11-29 MED ORDER — BUSPIRONE HCL 15 MG PO TABS: 15.0000 mg | ORAL_TABLET | Freq: Three times a day (TID) | ORAL | 3 refills | Status: DC

## 2023-11-29 MED ORDER — CITALOPRAM HYDROBROMIDE 20 MG PO TABS: 20.0000 mg | ORAL_TABLET | Freq: Every day | ORAL | 3 refills | Status: DC

## 2023-11-29 MED ORDER — HALOPERIDOL 5 MG PO TABS
5.0000 mg | ORAL_TABLET | Freq: Every evening | ORAL | 3 refills | Status: DC
Start: 2023-11-29 — End: 2024-02-07

## 2023-11-29 MED ORDER — TRAZODONE HCL 100 MG PO TABS: 200.0000 mg | ORAL_TABLET | Freq: Every day | ORAL | 3 refills | Status: DC

## 2023-11-29 MED ORDER — DIVALPROEX SODIUM 500 MG PO DR TAB: 1500.0000 mg | DELAYED_RELEASE_TABLET | Freq: Every evening | ORAL | 3 refills | Status: DC

## 2023-11-29 NOTE — Progress Notes (Signed)
 BH MD/PA/NP OP Progress Note Virtual Visit via Video Note  I connected with Vicki Lee on 11/29/23 at 10:30 AM EDT by a video enabled telemedicine application and verified that I am speaking with the correct person using two identifiers.  Location: Patient: Home Provider: Clinic   I discussed the limitations of evaluation and management by telemedicine and the availability of in person appointments. The patient expressed understanding and agreed to proceed.  I provided 30 minutes of non-face-to-face time during this encounter.             11/29/2023 10:36 AM Vicki Lee  MRN:  [id number]  Chief Complaint: "I am doing the same"  HPI: 43 year-old female seen today for follow up psychiatric evaluation. Patient has a psychiatric history of schizophrenia, depression, and anxiety. She is currently managed on Depakote 1500 mg nightly, Buspar 15 mg TID, Haldol 5 mg trazodone 200 mg nightly, Zyprexa 20 mg nightly, Hydroxyzine 50 mg four times daily, and Celexa 20 mg daily. Today patient notes that medications are effective in managing her symptoms.   Today she was well-groomed, pleasant, cooperative, engaged in conversation, and maintained eye contact.  She informed Clinical research associate that things are the same.  She reports that she continues to have her hallucinations but notes that they are quieter. She reports that this is bothersome but request that medications not be adjusted. Provider informed patient to research Fanapt and Cobenfy.   Since her last visit she informed writer that she continues to have minimal anxiety and depression.  Today provider conducted a GAD-7 and patient scored a 8, at her last visit she scored 7.  Provider also conducted a PHQ-9 patient scored an 11, at her last visit she scored an 8.  She endorses adequate appetite. She reports sleeping 4-5 hours nightly.  At this time patient request that medication not be adjusted.  No medication changes made today.  Patient  agreeable to continue medication as prescribed. No other concerns noted at this time  Visit Diagnosis:    ICD-10-CM   1. Generalized anxiety disorder  F41.1 busPIRone (BUSPAR) 15 MG tablet    hydrOXYzine (VISTARIL) 50 MG capsule    2. Schizoaffective disorder, bipolar type (HCC)  F25.0 busPIRone (BUSPAR) 15 MG tablet    citalopram (CELEXA) 20 MG tablet    divalproex (DEPAKOTE) 500 MG DR tablet    haloperidol (HALDOL) 5 MG tablet    OLANZapine (ZYPREXA) 20 MG tablet    traZODone (DESYREL) 100 MG tablet          Past Psychiatric History: Anxiety, depression, and schizophrenia Past Medical History:  Past Medical History:  Diagnosis Date   Anxiety    Crohn disease (HCC)    Depression    Fibroid    Infertility, female     Past Surgical History:  Procedure Laterality Date   COLON SURGERY  2000    due to Chron's age 55.   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   MYOMECTOMY  2019    Family Psychiatric History: Unknown  Family History:  Family History  Problem Relation Age of Onset   Hyperlipidemia Mother    Hypertension Mother    Crohn's disease Father    Crohn's disease Brother    Mental illness Maternal Grandmother    Bone cancer Maternal Grandfather    Mental illness Paternal Grandmother     Social History:  Social History   Socioeconomic History   Marital status: Married    Spouse  name: Not on file   Number of children: Not on file   Years of education: Not on file   Highest education level: Not on file  Occupational History   Not on file  Tobacco Use   Smoking status: Never   Smokeless tobacco: Never  Vaping Use   Vaping status: Never Used  Substance and Sexual Activity   Alcohol use: No   Drug use: No   Sexual activity: Yes    Partners: Male    Birth control/protection: None  Other Topics Concern   Not on file  Social History Narrative   Not on file   Social Drivers of Health   Financial Resource Strain: Not on file  Food Insecurity:  Not on file  Transportation Needs: Not on file  Physical Activity: Not on file  Stress: Not on file  Social Connections: Not on file    Allergies: Not on File  Metabolic Disorder Labs: Lab Results  Component Value Date   HGBA1C 5.4 08/01/2018   Lab Results  Component Value Date   PROLACTIN 171.3 (H) 05/19/2016   PROLACTIN 159.4 (H) 01/07/2016   Lab Results  Component Value Date   CHOL 196 05/29/2018   TRIG 177 (H) 05/29/2018   HDL 39 (L) 05/29/2018   CHOLHDL 5.0 (H) 05/29/2018   VLDL 19 05/19/2016   LDLCALC 122 (H) 05/29/2018   LDLCALC 129 (H) 05/19/2016   Lab Results  Component Value Date   TSH 2.384 05/19/2016   TSH 2.461 01/07/2016    Therapeutic Level Labs: No results found for: "LITHIUM" No results found for: "VALPROATE" No results found for: "CBMZ"  Current Medications: Current Outpatient Medications  Medication Sig Dispense Refill   busPIRone (BUSPAR) 15 MG tablet Take 1 tablet (15 mg total) by mouth 3 (three) times daily. 90 tablet 3   citalopram (CELEXA) 20 MG tablet Take 1 tablet (20 mg total) by mouth daily. 30 tablet 3   divalproex (DEPAKOTE) 500 MG DR tablet Take 3 tablets (1,500 mg total) by mouth at bedtime. 90 tablet 3   haloperidol (HALDOL) 5 MG tablet Take 1 tablet (5 mg total) by mouth at bedtime. 30 tablet 3   HYDROcodone-acetaminophen (NORCO/VICODIN) 5-325 MG tablet Take 1-2 tablets by mouth every 6 (six) hours as needed for severe pain. 12 tablet 0   hydrOXYzine (VISTARIL) 50 MG capsule Take 1 capsule (50 mg total) by mouth every 6 (six) hours as needed for anxiety. 120 capsule 3   Multiple Vitamins-Minerals (MULTIVITAMIN PO) Take by mouth.     OLANZapine (ZYPREXA) 20 MG tablet Take 1 tablet (20 mg total) by mouth at bedtime. 30 tablet 3   ondansetron (ZOFRAN) 4 MG tablet Take 1 tablet (4 mg total) by mouth every 6 (six) hours. 12 tablet 0   traZODone (DESYREL) 100 MG tablet Take 2 tablets (200 mg total) by mouth at bedtime. 60 tablet 3   No  current facility-administered medications for this visit.     Musculoskeletal: Strength & Muscle Tone: within normal limits and telehealthvisit Gait & Station: normal,  telehealth visit.  Patient leans: N/A  Psychiatric Specialty Exam:   There were no vitals taken for this visit.There is no height or weight on file to calculate BMI.  General Appearance: Well Groomed  Eye Contact:  Good  Speech:  Clear and Coherent and Normal Rate  Volume:  Normal  Mood:  Euthymic  Affect:  Congruent  Thought Process:  Coherent, Goal Directed and Linear  Orientation:  Full (Time,  Place, and Person)  Thought Content: Logical and Hallucinations: Auditory   Suicidal Thoughts:  No  Homicidal Thoughts:  No  Memory:  Immediate;   Good Recent;   Good Remote;   Good  Judgement:  Good  Insight:  Good  Psychomotor Activity:  Normal and Telehealth visit  Concentration:  Concentration: Good and Attention Span: Good  Recall:  Good  Fund of Knowledge: Good  Language: Good  Akathisia:  No  Handed:  Right  AIMS (if indicated): not done  Assets:  Communication Skills Desire for Improvement Financial Resources/Insurance Housing  ADL's:  Intact  Cognition: WNL  Sleep:  Poor   Screenings: AIMS    Flowsheet Row Video Visit from 05/19/2022 in [facility] Admission (Discharged) from 01/06/2016 in [facility] INPATIENT BEHAVIORAL MEDICINE  AIMS Total Score 0 0      AUDIT    Flowsheet Row Admission (Discharged) from 05/18/2016 in [facility] INPATIENT BEHAVIORAL MEDICINE Admission (Discharged) from 01/06/2016 in [facility] INPATIENT BEHAVIORAL MEDICINE  Alcohol Use Disorder Identification Test Final Score (AUDIT) 0 0      GAD-7    Flowsheet Row Video Visit from 11/29/2023 in [facility] Video Visit from 09/20/2023 in [facility] Video Visit from 07/05/2023 in [facility] Video Visit from 04/12/2023 in [facility] Video Visit from 01/25/2023 in [facility]  Total GAD-7 Score 8 7 10 12 16       PHQ2-9    Flowsheet Row Video Visit from 11/29/2023 in [facility] Video Visit from 09/20/2023 in [facility] Video Visit from 07/05/2023 in [facility] Video Visit from 04/12/2023 in [facility] Video Visit from 01/25/2023 in [facility]  PHQ-2 Total Score 4 2 2 5 4   PHQ-9 Total Score 11 8 10 12 15       Flowsheet Row Video Visit from 02/18/2021 in [facility] Video Visit from 11/24/2020 in [facility]  C-SSRS RISK CATEGORY Error: Q7 should not be populated when Q6 is No No Risk        Assessment and Plan: Patient endorses auditory hallucinations. At this time patient request that medication not be adjusted.  Provider discussed increasing Haldol in the future or trialing other medications such as Cobenfy or Fanapt.  1. Generalized anxiety disorder  Continue- busPIRone (BUSPAR) 15 MG tablet; Take 1 tablet (15 mg total) by mouth 3 (three) times daily.  Dispense: 90 tablet; Refill: 3 Continue- hydrOXYzine (VISTARIL) 50 MG capsule; Take 1 capsule (50 mg total) by mouth every 6 (six) hours as needed for anxiety.  Dispense: 120 capsule; Refill: 3  2. Schizoaffective disorder, bipolar type (HCC)  Continue- busPIRone (BUSPAR) 15 MG tablet; Take 1 tablet (15 mg total) by mouth 3 (three) times daily.  Dispense: 90 tablet; Refill: 3 Continue- citalopram (CELEXA) 20 MG tablet; Take 1 tablet (20 mg total) by mouth daily.  Dispense: 30 tablet; Refill: 3 Continue- divalproex (DEPAKOTE) 500 MG DR tablet; Take 3 tablets (1,500 mg total) by mouth at bedtime.  Dispense: 90 tablet; Refill: 3 Continue- haloperidol (HALDOL) 5 MG tablet; Take 1 tablet (5 mg total) by mouth at  bedtime.  Dispense: 30 tablet; Refill: 3 Continue- OLANZapine (ZYPREXA) 20 MG tablet; Take 1 tablet (20 mg total) by mouth at bedtime.  Dispense: 30 tablet; Refill: 3 Continue- traZODone (DESYREL) 100  MG tablet; Take 2 tablets (200 mg total) by mouth at bedtime.  Dispense: 60 tablet; Refill: 3     Follow-up in 2.5 month    Shanna Cisco, NP 11/29/2023, 10:36 AM

## 2024-02-07 ENCOUNTER — Telehealth (HOSPITAL_COMMUNITY): Payer: Medicare (Managed Care) | Admitting: Psychiatry

## 2024-02-07 ENCOUNTER — Encounter (HOSPITAL_COMMUNITY): Payer: Self-pay | Admitting: Psychiatry

## 2024-02-07 DIAGNOSIS — F411 Generalized anxiety disorder: Secondary | ICD-10-CM | POA: Diagnosis not present

## 2024-02-07 DIAGNOSIS — F250 Schizoaffective disorder, bipolar type: Secondary | ICD-10-CM | POA: Diagnosis not present

## 2024-02-07 MED ORDER — TRAZODONE HCL 100 MG PO TABS: 200.0000 mg | ORAL_TABLET | Freq: Every day | ORAL | 3 refills | Status: DC

## 2024-02-07 MED ORDER — OLANZAPINE 20 MG PO TABS
20.0000 mg | ORAL_TABLET | Freq: Every day | ORAL | 3 refills | Status: DC
Start: 2024-02-07 — End: 2024-07-26

## 2024-02-07 MED ORDER — CITALOPRAM HYDROBROMIDE 20 MG PO TABS: 20.0000 mg | ORAL_TABLET | Freq: Every day | ORAL | 3 refills | Status: DC

## 2024-02-07 MED ORDER — BUSPIRONE HCL 15 MG PO TABS
15.0000 mg | ORAL_TABLET | Freq: Three times a day (TID) | ORAL | 3 refills | Status: DC
Start: 2024-02-07 — End: 2024-04-25

## 2024-02-07 MED ORDER — HYDROXYZINE PAMOATE 50 MG PO CAPS: 50.0000 mg | ORAL_CAPSULE | Freq: Four times a day (QID) | ORAL | 3 refills | Status: DC | PRN

## 2024-02-07 MED ORDER — DIVALPROEX SODIUM 500 MG PO DR TAB: 1500.0000 mg | DELAYED_RELEASE_TABLET | Freq: Every evening | ORAL | 3 refills | Status: DC

## 2024-02-07 MED ORDER — HALOPERIDOL 5 MG PO TABS: 5.0000 mg | ORAL_TABLET | Freq: Every evening | ORAL | 3 refills | Status: DC

## 2024-02-07 NOTE — Progress Notes (Signed)
 BH MD/PA/NP OP Progress Note Virtual Visit via Video Note  I connected with Vicki Lee on 02/07/24 at 10:30 AM EDT by a video enabled telemedicine application and verified that I am speaking with the correct person using two identifiers.  Location: Patient: Home Provider: Clinic   I discussed the limitations of evaluation and management by telemedicine and the availability of in person appointments. The patient expressed understanding and agreed to proceed.  I provided 30 minutes of non-face-to-face time during this encounter.             02/07/2024 10:44 AM Vicki Lee  MRN:  [id number]  Chief Complaint: "I am taking things day by day"  HPI: 43 year-old female seen today for follow up psychiatric evaluation. Patient has a psychiatric history of schizophrenia, depression, and anxiety. She is currently managed on Depakote  1500 mg nightly, Buspar  15 mg TID, Haldol  5 mg, trazodone  200 mg nightly, Zyprexa  20 mg nightly, Hydroxyzine  50 mg four times daily, and Celexa  20 mg daily. Today patient notes that medications are effective in managing her symptoms.   Today she was well-groomed, pleasant, cooperative, engaged in conversation, and maintained eye contact.  She informed Clinical research associate that she is taking things day by day.  She notes that she continues to have hallucinations but notes that she is able to cope with it. Provider asked patient if she research Fanapt and Cobenfy. She notes that she has but prefers to continue her current regimen.   Since her last visit she informed writer that she continues to have minimal anxiety and depression.  Today provider conducted a GAD-7 and patient scored a 9, at her last visit she scored 8.  Provider also conducted a PHQ-9 patient scored an 8, at her last visit she scored an 11.  She endorses adequate appetite. She reports sleeping 5-6 hours nightly.  At this time patient request that medication not be adjusted.  No medication changes made  today.  Patient agreeable to continue medication as prescribed. No other concerns noted at this time  Visit Diagnosis:    ICD-10-CM   1. Schizoaffective disorder, bipolar type (HCC)  F25.0 divalproex  (DEPAKOTE ) 500 MG DR tablet    busPIRone  (BUSPAR ) 15 MG tablet    citalopram  (CELEXA ) 20 MG tablet    OLANZapine  (ZYPREXA ) 20 MG tablet    traZODone  (DESYREL ) 100 MG tablet    haloperidol  (HALDOL ) 5 MG tablet    2. Generalized anxiety disorder  F41.1 busPIRone  (BUSPAR ) 15 MG tablet    hydrOXYzine  (VISTARIL ) 50 MG capsule           Past Psychiatric History: Anxiety, depression, and schizophrenia Past Medical History:  Past Medical History:  Diagnosis Date   Anxiety    Crohn disease (HCC)    Depression    Fibroid    Infertility, female     Past Surgical History:  Procedure Laterality Date   COLON SURGERY  2000    due to Chron's age 79.   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   MYOMECTOMY  2019    Family Psychiatric History: Unknown  Family History:  Family History  Problem Relation Age of Onset   Hyperlipidemia Mother    Hypertension Mother    Crohn's disease Father    Crohn's disease Brother    Mental illness Maternal Grandmother    Bone cancer Maternal Grandfather    Mental illness Paternal Grandmother     Social History:  Social History   Socioeconomic History  Marital status: Married    Spouse name: Not on file   Number of children: Not on file   Years of education: Not on file   Highest education level: Not on file  Occupational History   Not on file  Tobacco Use   Smoking status: Never   Smokeless tobacco: Never  Vaping Use   Vaping status: Never Used  Substance and Sexual Activity   Alcohol use: No   Drug use: No   Sexual activity: Yes    Partners: Male    Birth control/protection: None  Other Topics Concern   Not on file  Social History Narrative   Not on file   Social Drivers of Health   Financial Resource Strain: Not on file   Food Insecurity: Not on file  Transportation Needs: Not on file  Physical Activity: Not on file  Stress: Not on file  Social Connections: Not on file    Allergies: Not on File  Metabolic Disorder Labs: Lab Results  Component Value Date   HGBA1C 5.4 08/01/2018   Lab Results  Component Value Date   PROLACTIN 171.3 (H) 05/19/2016   PROLACTIN 159.4 (H) 01/07/2016   Lab Results  Component Value Date   CHOL 196 05/29/2018   TRIG 177 (H) 05/29/2018   HDL 39 (L) 05/29/2018   CHOLHDL 5.0 (H) 05/29/2018   VLDL 19 05/19/2016   LDLCALC 122 (H) 05/29/2018   LDLCALC 129 (H) 05/19/2016   Lab Results  Component Value Date   TSH 2.384 05/19/2016   TSH 2.461 01/07/2016    Therapeutic Level Labs: No results found for: "LITHIUM" No results found for: "VALPROATE" No results found for: "CBMZ"  Current Medications: Current Outpatient Medications  Medication Sig Dispense Refill   busPIRone  (BUSPAR ) 15 MG tablet Take 1 tablet (15 mg total) by mouth 3 (three) times daily. 90 tablet 3   citalopram  (CELEXA ) 20 MG tablet Take 1 tablet (20 mg total) by mouth daily. 30 tablet 3   divalproex  (DEPAKOTE ) 500 MG DR tablet Take 3 tablets (1,500 mg total) by mouth at bedtime. 90 tablet 3   haloperidol  (HALDOL ) 5 MG tablet Take 1 tablet (5 mg total) by mouth at bedtime. 30 tablet 3   HYDROcodone -acetaminophen  (NORCO/VICODIN) 5-325 MG tablet Take 1-2 tablets by mouth every 6 (six) hours as needed for severe pain. 12 tablet 0   hydrOXYzine  (VISTARIL ) 50 MG capsule Take 1 capsule (50 mg total) by mouth every 6 (six) hours as needed for anxiety. 120 capsule 3   Multiple Vitamins-Minerals (MULTIVITAMIN PO) Take by mouth.     OLANZapine  (ZYPREXA ) 20 MG tablet Take 1 tablet (20 mg total) by mouth at bedtime. 30 tablet 3   ondansetron  (ZOFRAN ) 4 MG tablet Take 1 tablet (4 mg total) by mouth every 6 (six) hours. 12 tablet 0   traZODone  (DESYREL ) 100 MG tablet Take 2 tablets (200 mg total) by mouth at bedtime.  60 tablet 3   No current facility-administered medications for this visit.     Musculoskeletal: Strength & Muscle Tone: within normal limits and telehealthvisit Gait & Station: normal,  telehealth visit.  Patient leans: N/A  Psychiatric Specialty Exam:   There were no vitals taken for this visit.There is no height or weight on file to calculate BMI.  General Appearance: Well Groomed  Eye Contact:  Good  Speech:  Clear and Coherent and Normal Rate  Volume:  Normal  Mood:  Euthymic  Affect:  Congruent  Thought Process:  Coherent, Goal Directed  and Linear  Orientation:  Full (Time, Place, and Person)  Thought Content: Logical and Hallucinations: Auditory   Suicidal Thoughts:  No  Homicidal Thoughts:  No  Memory:  Immediate;   Good Recent;   Good Remote;   Good  Judgement:  Good  Insight:  Good  Psychomotor Activity:  Normal and Telehealth visit  Concentration:  Concentration: Good and Attention Span: Good  Recall:  Good  Fund of Knowledge: Good  Language: Good  Akathisia:  No  Handed:  Right  AIMS (if indicated): not done  Assets:  Communication Skills Desire for Improvement Financial Resources/Insurance Housing  ADL's:  Intact  Cognition: WNL  Sleep:  Fair   Screenings: AIMS    Flowsheet Row Video Visit from 05/19/2022 in [facility] Admission (Discharged) from 01/06/2016 in [facility] INPATIENT BEHAVIORAL MEDICINE  AIMS Total Score 0 0      AUDIT    Flowsheet Row Admission (Discharged) from 05/18/2016 in [facility] INPATIENT BEHAVIORAL MEDICINE Admission (Discharged) from 01/06/2016 in [facility] INPATIENT BEHAVIORAL MEDICINE  Alcohol Use Disorder Identification Test Final Score (AUDIT) 0 0      GAD-7    Flowsheet Row Video Visit from 02/07/2024 in [facility] Video Visit from 11/29/2023 in [facility] Video Visit from 09/20/2023 in [facility] Video Visit from  07/05/2023 in [facility] Video Visit from 04/12/2023 in [facility]  Total GAD-7 Score 9 8 7 10 12       PHQ2-9    Flowsheet Row Video Visit from 02/07/2024 in [facility] Video Visit from 11/29/2023 in [facility] Video Visit from 09/20/2023 in [facility] Video Visit from 07/05/2023 in [facility] Video Visit from 04/12/2023 in [facility]  PHQ-2 Total Score 1 4 2 2 5   PHQ-9 Total Score 8 11 8 10 12       Flowsheet Row Video Visit from 02/18/2021 in [facility] Video Visit from 11/24/2020 in [facility]  C-SSRS RISK CATEGORY Error: Q7 should not be populated when Q6 is No No Risk        Assessment and Plan: Patient endorses auditory hallucinations but reports that she is able to cope with it.  She notes that her anxiety, depression, and sleep are well-managed.  Patient notes that she researched. Cobenfy and Fanapt but notes that she prefers to continue her current medication regimen.  No medication changes made today.  Patient agreeable taking medication as prescribed.  1. Generalized anxiety disorder  Continue- busPIRone  (BUSPAR ) 15 MG tablet; Take 1 tablet (15 mg total) by mouth 3 (three) times daily.  Dispense: 90 tablet; Refill: 3 Continue- hydrOXYzine  (VISTARIL ) 50 MG capsule; Take 1 capsule (50 mg total) by mouth every 6 (six) hours as needed for anxiety.  Dispense: 120 capsule; Refill: 3  2. Schizoaffective disorder, bipolar type (HCC)  Continue- busPIRone  (BUSPAR ) 15 MG tablet; Take 1 tablet (15 mg total) by mouth 3 (three) times daily.  Dispense: 90 tablet; Refill: 3 Continue- citalopram  (CELEXA ) 20 MG tablet; Take 1 tablet (20 mg total) by mouth daily.  Dispense: 30 tablet; Refill: 3 Continue- divalproex  (DEPAKOTE ) 500 MG DR  tablet; Take 3 tablets (1,500 mg total) by mouth at bedtime.  Dispense: 90 tablet; Refill: 3 Continue- haloperidol  (HALDOL ) 5 MG tablet; Take 1 tablet (5 mg total) by mouth  at bedtime.  Dispense: 30 tablet; Refill: 3 Continue- OLANZapine  (ZYPREXA ) 20 MG tablet; Take 1 tablet (20 mg total) by mouth at bedtime.  Dispense: 30 tablet; Refill: 3 Continue- traZODone  (DESYREL ) 100 MG tablet; Take 2 tablets (200 mg total) by mouth at bedtime.  Dispense: 60 tablet; Refill: 3     Follow-up in 2.5 month    Arlyne Bering, NP 02/07/2024, 10:44 AM

## 2024-04-17 ENCOUNTER — Telehealth (HOSPITAL_COMMUNITY): Payer: Medicare (Managed Care) | Admitting: Psychiatry

## 2024-04-18 ENCOUNTER — Telehealth (HOSPITAL_COMMUNITY): Payer: Medicare (Managed Care) | Admitting: Psychiatry

## 2024-04-25 ENCOUNTER — Encounter (HOSPITAL_COMMUNITY): Payer: Self-pay | Admitting: Family

## 2024-04-25 ENCOUNTER — Telehealth (HOSPITAL_COMMUNITY): Payer: Medicare (Managed Care) | Admitting: Psychiatry

## 2024-04-25 ENCOUNTER — Telehealth (INDEPENDENT_AMBULATORY_CARE_PROVIDER_SITE_OTHER): Payer: Medicare (Managed Care) | Admitting: Family

## 2024-04-25 DIAGNOSIS — F411 Generalized anxiety disorder: Secondary | ICD-10-CM

## 2024-04-25 DIAGNOSIS — F250 Schizoaffective disorder, bipolar type: Secondary | ICD-10-CM

## 2024-04-25 MED ORDER — CITALOPRAM HYDROBROMIDE 20 MG PO TABS: 20.0000 mg | ORAL_TABLET | Freq: Every day | ORAL | 3 refills | Status: DC

## 2024-04-25 MED ORDER — TRAZODONE HCL 100 MG PO TABS: 200.0000 mg | ORAL_TABLET | Freq: Every day | ORAL | 3 refills | Status: DC

## 2024-04-25 MED ORDER — HYDROXYZINE PAMOATE 50 MG PO CAPS: 50.0000 mg | ORAL_CAPSULE | Freq: Four times a day (QID) | ORAL | 3 refills | Status: DC | PRN

## 2024-04-25 MED ORDER — DIVALPROEX SODIUM 500 MG PO DR TAB: 1500.0000 mg | DELAYED_RELEASE_TABLET | Freq: Every evening | ORAL | 3 refills | Status: DC

## 2024-04-25 MED ORDER — BUSPIRONE HCL 15 MG PO TABS
15.0000 mg | ORAL_TABLET | Freq: Three times a day (TID) | ORAL | 3 refills | Status: DC
Start: 2024-04-25 — End: 2024-07-26

## 2024-04-25 MED ORDER — HALOPERIDOL 5 MG PO TABS: 5.0000 mg | ORAL_TABLET | Freq: Every evening | ORAL | 3 refills | Status: DC

## 2024-04-25 NOTE — Progress Notes (Signed)
 BH MD/PA/NP OP Progress Note Virtual Visit via Video Note  I connected with Vicki Lee on 04/25/24 at  2:00 PM EDT by a video enabled telemedicine application and verified that I am speaking with the correct person using two identifiers.  Location: Patient: Home Provider: Clinic   I discussed the limitations of evaluation and management by telemedicine and the availability of in person appointments. The patient expressed understanding and agreed to proceed.  I provided 30 minutes of non-face-to-face time during this encounter.     04/25/2024 2:04 PM Vicki Lee  MRN:  [id number]  Chief Complaint: I am taking things day by day  HPI: 43 year-old female seen today for follow up psychiatric evaluation. Patient has a psychiatric history of schizophrenia, depression, and anxiety. She is currently managed on Depakote  1500 mg nightly, Buspar  15 mg TID, Haldol  5 mg, trazodone  200 mg nightly, Zyprexa  20 mg nightly, Hydroxyzine  50 mg four times daily, and Celexa  20 mg daily. Today patient notes that medications are effective in managing her symptoms.   Today she was well-groomed, pleasant, cooperative, engaged in conversation, and maintained eye contact.  She informed Clinical research associate that she is taking things day by day.  Patient reports doing well overall with no significant changes in her auditory hallucinations. She continues to hear the voice, which remains distressing at times, and notes it is more prominent at night than during the day. She endorses feeling somewhat more tired but attributes improvement in coping to her own strategies, including exercise, walking, reading the Bible, and maintaining her faith. She is unsure if her medication has reduced the intensity of the voices, stating her coping skills have been the primary reason for improvement. She expresses interest in starting therapy. Appetite and sleep are adequate. Denies suicidal ideation, homicidal ideation, and visual  hallucinations. She denies wanting any medication changes at this time, stating she may be open to adjustments in the future but currently prefers to keep her regimen the same.  Objective: Patient is alert and oriented to person, place, time, and situation. Appropriately groomed and casually dressed. Maintains good eye contact, cooperative throughout the encounter. Very engaging and interacted well with the provider. Exhibited positive energy and maintained a positive outlook despite ongoing hallucinations. Speech is clear, coherent, and of normal rate and volume. Mood appears euthymic with congruent affect. Thought process is logical and goal-directed. Thought content notable for continued auditory hallucinations without evidence of delusions. No suicidal or homicidal ideation reported. No visual hallucinations. Insight and judgment appear fair.  At this time patient request that medication not be adjusted.  No medication changes made today.  Patient agreeable to continue medication as prescribed. No other concerns noted at this time  Visit Diagnosis:    ICD-10-CM   1. Schizoaffective disorder, bipolar type (HCC)  F25.0 traZODone  (DESYREL ) 100 MG tablet    haloperidol  (HALDOL ) 5 MG tablet    divalproex  (DEPAKOTE ) 500 MG DR tablet    citalopram  (CELEXA ) 20 MG tablet    busPIRone  (BUSPAR ) 15 MG tablet    2. Generalized anxiety disorder  F41.1 hydrOXYzine  (VISTARIL ) 50 MG capsule    busPIRone  (BUSPAR ) 15 MG tablet    3. Morbid obesity (HCC) Chronic E66.01        Past Psychiatric History: Anxiety, depression, and schizophrenia Past Medical History:  Past Medical History:  Diagnosis Date   Anxiety    Crohn disease (HCC)    Depression    Fibroid    Infertility, female     Past Surgical  History:  Procedure Laterality Date   COLON SURGERY  2000    due to Chron's age 59.   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   MYOMECTOMY  2019    Family Psychiatric History: Unknown  Family  History:  Family History  Problem Relation Age of Onset   Hyperlipidemia Mother    Hypertension Mother    Crohn's disease Father    Crohn's disease Brother    Mental illness Maternal Grandmother    Bone cancer Maternal Grandfather    Mental illness Paternal Grandmother     Social History:  Social History   Socioeconomic History   Marital status: Married    Spouse name: Not on file   Number of children: Not on file   Years of education: Not on file   Highest education level: Not on file  Occupational History   Not on file  Tobacco Use   Smoking status: Never   Smokeless tobacco: Never  Vaping Use   Vaping status: Never Used  Substance and Sexual Activity   Alcohol use: No   Drug use: No   Sexual activity: Yes    Partners: Male    Birth control/protection: None  Other Topics Concern   Not on file  Social History Narrative   Not on file   Social Drivers of Health   Financial Resource Strain: Not on file  Food Insecurity: Not on file  Transportation Needs: Not on file  Physical Activity: Not on file  Stress: Not on file  Social Connections: Not on file    Allergies: Not on File  Metabolic Disorder Labs: Lab Results  Component Value Date   HGBA1C 5.4 08/01/2018   Lab Results  Component Value Date   PROLACTIN 171.3 (H) 05/19/2016   PROLACTIN 159.4 (H) 01/07/2016   Lab Results  Component Value Date   CHOL 196 05/29/2018   TRIG 177 (H) 05/29/2018   HDL 39 (L) 05/29/2018   CHOLHDL 5.0 (H) 05/29/2018   VLDL 19 05/19/2016   LDLCALC 122 (H) 05/29/2018   LDLCALC 129 (H) 05/19/2016   Lab Results  Component Value Date   TSH 2.384 05/19/2016   TSH 2.461 01/07/2016    Therapeutic Level Labs: No results found for: LITHIUM No results found for: VALPROATE No results found for: CBMZ  Current Medications: Current Outpatient Medications  Medication Sig Dispense Refill   busPIRone (BUSPAR) 15 MG tablet Take 1 tablet (15 mg total) by mouth 3 (three)  times daily. 90 tablet 3   citalopram (CELEXA) 20 MG tablet Take 1 tablet (20 mg total) by mouth daily. 30 tablet 3   divalproex (DEPAKOTE) 500 MG DR tablet Take 3 tablets (1,500 mg total) by mouth at bedtime. 90 tablet 3   haloperidol (HALDOL) 5 MG tablet Take 1 tablet (5 mg total) by mouth at bedtime. 30 tablet 3   HYDROcodone-acetaminophen (NORCO/VICODIN) 5-325 MG tablet Take 1-2 tablets by mouth every 6 (six) hours as needed for severe pain. 12 tablet 0   hydrOXYzine (VISTARIL) 50 MG capsule Take 1 capsule (50 mg total) by mouth every 6 (six) hours as needed for anxiety. 120 capsule 3   Multiple Vitamins-Minerals (MULTIVITAMIN PO) Take by mouth.     OLANZapine (ZYPREXA) 20 MG tablet Take 1 tablet (20 mg total) by mouth at bedtime. 30 tablet 3   ondansetron (ZOFRAN) 4 MG tablet Take 1 tablet (4 mg total) by mouth every 6 (six) hours. 12 tablet 0   traZODone (  DESYREL) 100 MG tablet Take 2 tablets (200 mg total) by mouth at bedtime. 60 tablet 3   No current facility-administered medications for this visit.     Musculoskeletal: Strength & Muscle Tone: within normal limits and telehealthvisit Gait & Station: normal,  telehealth visit.  Patient leans: N/A  Psychiatric Specialty Exam:   There were no vitals taken for this visit.There is no height or weight on file to calculate BMI.  General Appearance: Well Groomed  Eye Contact:  Good  Speech:  Clear and Coherent and Normal Rate  Volume:  Normal  Mood:  Euthymic  Affect:  Congruent  Thought Process:  Coherent, Goal Directed and Linear  Orientation:  Full (Time, Place, and Person)  Thought Content: Logical and Hallucinations: Auditory   Suicidal Thoughts:  No  Homicidal Thoughts:  No  Memory:  Immediate;   Good Recent;   Good Remote;   Good  Judgement:  Good  Insight:  Good  Psychomotor Activity:  Normal and Telehealth visit  Concentration:  Concentration: Good and Attention Span: Good  Recall:  Good  Fund of Knowledge: Good   Language: Good  Akathisia:  No  Handed:  Right  AIMS (if indicated): not done  Assets:  Communication Skills Desire for Improvement Financial Resources/Insurance Housing  ADL's:  Intact  Cognition: WNL  Sleep:  Fair   Screenings: AIMS    Flowsheet Row Video Visit from 05/19/2022 in [facility] Admission (Discharged) from 01/06/2016 in [facility] INPATIENT BEHAVIORAL MEDICINE  AIMS Total Score 0 0   AUDIT    Flowsheet Row Admission (Discharged) from 05/18/2016 in [facility] INPATIENT BEHAVIORAL MEDICINE Admission (Discharged) from 01/06/2016 in [facility] INPATIENT BEHAVIORAL MEDICINE  Alcohol Use Disorder Identification Test Final Score (AUDIT) 0 0   GAD-7    Flowsheet Row Video Visit from 02/07/2024 in [facility] Video Visit from 11/29/2023 in [facility] Video Visit from 09/20/2023 in [facility] Video Visit from 07/05/2023 in [facility] Video Visit from 04/12/2023 in [facility]  Total GAD-7 Score 9 8 7 10 12    PHQ2-9    Flowsheet Row Video Visit from 02/07/2024 in [facility] Video Visit from 11/29/2023 in [facility] Video Visit from 09/20/2023 in [facility] Video Visit from 07/05/2023 in [facility] Video Visit from 04/12/2023 in [facility]  PHQ-2 Total Score 1 4 2 2 5   PHQ-9 Total Score 8 11 8 10 12    Flowsheet Row Video Visit from 02/18/2021 in [facility] Video Visit from 11/24/2020 in [facility]  C-SSRS RISK CATEGORY Error: Q7 should not be populated when Q6 is No No Risk     Assessment and Plan: Patient endorses auditory hallucinations but reports that she is able to cope with it.  She notes that her anxiety, depression, and  sleep are well-managed.  No medication changes made today.  Patient agreeable taking medication as prescribed.  1. Generalized anxiety disorder  Continue- busPIRone (BUSPAR) 15 MG tablet; Take 1 tablet (15 mg total) by mouth 3 (three) times daily.  Dispense: 90 tablet; Refill: 3 Continue- hydrOXYzine (VISTARIL) 50 MG capsule; Take 1 capsule (50 mg total) by mouth every 6 (six) hours as needed for anxiety.  Dispense: 120 capsule; Refill: 3  2. Schizoaffective disorder, bipolar type (HCC)  Continue- busPIRone (BUSPAR) 15  MG tablet; Take 1 tablet (15 mg total) by mouth 3 (three) times daily.  Dispense: 90 tablet; Refill: 3 Continue- citalopram (CELEXA) 20 MG tablet; Take 1 tablet (20 mg total) by mouth daily.  Dispense: 30 tablet; Refill: 3 Continue- divalproex (DEPAKOTE) 500 MG DR tablet; Take 3 tablets (1,500 mg total) by mouth at bedtime.  Dispense: 90 tablet; Refill: 3 Continue- haloperidol (HALDOL) 5 MG tablet; Take 1 tablet (5 mg total) by mouth at bedtime.  Dispense: 30 tablet; Refill: 3 Continue- OLANZapine (ZYPREXA) 20 MG tablet; Take 1 tablet (20 mg total) by mouth at bedtime.  Dispense: 30 tablet; Refill: 3 Continue- traZODone (DESYREL) 100 MG tablet; Take 2 tablets (200 mg total) by mouth at bedtime.  Dispense: 60 tablet; Refill: 3   Follow-up in 2.5 month    Majel GORMAN Ramp, FNP 04/25/2024, 2:04 PM

## 2024-07-26 ENCOUNTER — Encounter (HOSPITAL_COMMUNITY): Payer: Self-pay | Admitting: Psychiatry

## 2024-07-26 ENCOUNTER — Telehealth (HOSPITAL_COMMUNITY): Payer: Medicare (Managed Care) | Admitting: Psychiatry

## 2024-07-26 DIAGNOSIS — F250 Schizoaffective disorder, bipolar type: Secondary | ICD-10-CM | POA: Diagnosis not present

## 2024-07-26 DIAGNOSIS — F411 Generalized anxiety disorder: Secondary | ICD-10-CM

## 2024-07-26 MED ORDER — BUSPIRONE HCL 15 MG PO TABS: 15.0000 mg | ORAL_TABLET | Freq: Three times a day (TID) | ORAL | 3 refills | Status: DC

## 2024-07-26 MED ORDER — TRAZODONE HCL 100 MG PO TABS: 200.0000 mg | ORAL_TABLET | Freq: Every day | ORAL | 3 refills | Status: DC

## 2024-07-26 MED ORDER — OLANZAPINE 20 MG PO TABS: 20.0000 mg | ORAL_TABLET | Freq: Every day | ORAL | 3 refills | Status: DC

## 2024-07-26 MED ORDER — HYDROXYZINE PAMOATE 50 MG PO CAPS: 50.0000 mg | ORAL_CAPSULE | Freq: Four times a day (QID) | ORAL | 3 refills | Status: DC | PRN

## 2024-07-26 MED ORDER — CITALOPRAM HYDROBROMIDE 20 MG PO TABS: 20.0000 mg | ORAL_TABLET | Freq: Every day | ORAL | 3 refills | Status: DC

## 2024-07-26 MED ORDER — HALOPERIDOL 5 MG PO TABS: 5.0000 mg | ORAL_TABLET | Freq: Every evening | ORAL | 3 refills | Status: DC

## 2024-07-26 MED ORDER — DIVALPROEX SODIUM 500 MG PO DR TAB: 1500.0000 mg | DELAYED_RELEASE_TABLET | Freq: Every evening | ORAL | 3 refills | Status: DC

## 2024-07-26 NOTE — Progress Notes (Signed)
 BH MD/PA/NP OP Progress Note Virtual Visit via Video Note  I connected with Vicki Lee on 07/26/24 at 11:30 AM EST by a video enabled telemedicine application and verified that I am speaking with the correct person using two identifiers.  Location: Patient: Home Provider: Clinic   I discussed the limitations of evaluation and management by telemedicine and the availability of in person appointments. The patient expressed understanding and agreed to proceed.  I provided 30 minutes of non-face-to-face time during this encounter.             07/26/2024 11:43 AM Vicki Lee  MRN:  [id number]  Chief Complaint: I have been having more anxiety  HPI: 43 year-old female seen today for follow up psychiatric evaluation. Patient has a psychiatric history of schizophrenia, depression, and anxiety. She is currently managed on Depakote  1500 mg nightly, Buspar  15 mg TID, Haldol  5 mg, trazodone  200 mg nightly, Zyprexa  20 mg nightly, Hydroxyzine  50 mg four times daily, and Celexa  20 mg daily. Today patient notes that medications are somewhat effective in managing her symptoms.   Today she was well-groomed, pleasant, cooperative, engaged in conversation, and maintained eye contact.  She informed clinical research associate that she has been more anxious.  She notes that she is worried about her future and informed her constant hallucinations.  She notes that she wishes that things would stop.  She reports that they continue to tell her what she is want to do before she does it.  She notes that this is distracting.  Patient reports that despite this she does not wish to change her medications.   Today provider conducted GAD-7 and patient scored a 14.  Provider also conducted PHQ-9 and he scored a 13.  She endorses sleeping 5 to 6 hours nightly.  Today she denies SI/HI, mania, paranoia.     At this time patient request that medication not be adjusted.  No medication changes made today.  Patient agreeable to  continue medication as prescribed. No other concerns noted at this time  Visit Diagnosis:    ICD-10-CM   1. Schizoaffective disorder, bipolar type (HCC)  F25.0 EKG 12-Lead    divalproex  (DEPAKOTE ) 500 MG DR tablet    busPIRone  (BUSPAR ) 15 MG tablet    citalopram  (CELEXA ) 20 MG tablet    OLANZapine  (ZYPREXA ) 20 MG tablet    traZODone  (DESYREL ) 100 MG tablet    haloperidol  (HALDOL ) 5 MG tablet    2. Generalized anxiety disorder  F41.1 busPIRone  (BUSPAR ) 15 MG tablet    hydrOXYzine  (VISTARIL ) 50 MG capsule            Past Psychiatric History: Anxiety, depression, and schizophrenia Past Medical History:  Past Medical History:  Diagnosis Date   Anxiety    Crohn disease (HCC)    Depression    Fibroid    Infertility, female     Past Surgical History:  Procedure Laterality Date   COLON SURGERY  2000    due to Chron's age 56.   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   MYOMECTOMY  2019    Family Psychiatric History: Unknown  Family History:  Family History  Problem Relation Age of Onset   Hyperlipidemia Mother    Hypertension Mother    Crohn's disease Father    Crohn's disease Brother    Mental illness Maternal Grandmother    Bone cancer Maternal Grandfather    Mental illness Paternal Grandmother     Social History:  Social History  Socioeconomic History   Marital status: Married    Spouse name: Not on file   Number of children: Not on file   Years of education: Not on file   Highest education level: Not on file  Occupational History   Not on file  Tobacco Use   Smoking status: Never   Smokeless tobacco: Never  Vaping Use   Vaping status: Never Used  Substance and Sexual Activity   Alcohol use: No   Drug use: No   Sexual activity: Yes    Partners: Male    Birth control/protection: None  Other Topics Concern   Not on file  Social History Narrative   Not on file   Social Drivers of Health   Financial Resource Strain: Not on file  Food  Insecurity: Low Risk  (06/07/2024)   Received from [facility]   Hunger Vital Sign    Within the past 12 months, you worried that your food would run out before you got money to buy more: Never true    Within the past 12 months, the food you bought just didn't last and you didn't have money to get more. : Never true  Transportation Needs: No Transportation Needs (06/07/2024)   Received from [employer]    In the past 12 months, has lack of reliable transportation kept you from medical appointments, meetings, work or from getting things needed for daily living? : No  Physical Activity: Not on file  Stress: Not on file  Social Connections: Not on file    Allergies: Not on File  Metabolic Disorder Labs: Lab Results  Component Value Date   HGBA1C 5.4 08/01/2018   Lab Results  Component Value Date   PROLACTIN 171.3 (H) 05/19/2016   PROLACTIN 159.4 (H) 01/07/2016   Lab Results  Component Value Date   CHOL 196 05/29/2018   TRIG 177 (H) 05/29/2018   HDL 39 (L) 05/29/2018   CHOLHDL 5.0 (H) 05/29/2018   VLDL 19 05/19/2016   LDLCALC 122 (H) 05/29/2018   LDLCALC 129 (H) 05/19/2016   Lab Results  Component Value Date   TSH 2.384 05/19/2016   TSH 2.461 01/07/2016    Therapeutic Level Labs: No results found for: LITHIUM No results found for: VALPROATE No results found for: CBMZ  Current Medications: Current Outpatient Medications  Medication Sig Dispense Refill   busPIRone  (BUSPAR ) 15 MG tablet Take 1 tablet (15 mg total) by mouth 3 (three) times daily. 90 tablet 3   citalopram  (CELEXA ) 20 MG tablet Take 1 tablet (20 mg total) by mouth daily. 30 tablet 3   divalproex  (DEPAKOTE ) 500 MG DR tablet Take 3 tablets (1,500 mg total) by mouth at bedtime. 90 tablet 3   haloperidol  (HALDOL ) 5 MG tablet Take 1 tablet (5 mg total) by mouth at bedtime. 30 tablet 3   HYDROcodone -acetaminophen  (NORCO/VICODIN) 5-325 MG tablet Take 1-2 tablets by mouth every 6 (six)  hours as needed for severe pain. 12 tablet 0   hydrOXYzine  (VISTARIL ) 50 MG capsule Take 1 capsule (50 mg total) by mouth every 6 (six) hours as needed for anxiety. 120 capsule 3   Multiple Vitamins-Minerals (MULTIVITAMIN PO) Take by mouth.     OLANZapine  (ZYPREXA ) 20 MG tablet Take 1 tablet (20 mg total) by mouth at bedtime. 30 tablet 3   ondansetron  (ZOFRAN ) 4 MG tablet Take 1 tablet (4 mg total) by mouth every 6 (six) hours. 12 tablet 0   traZODone  (DESYREL ) 100 MG tablet Take 2  tablets (200 mg total) by mouth at bedtime. 60 tablet 3   No current facility-administered medications for this visit.     Musculoskeletal: Strength & Muscle Tone: within normal limits and telehealthvisit Gait & Station: normal,  telehealth visit.  Patient leans: N/A  Psychiatric Specialty Exam:   There were no vitals taken for this visit.There is no height or weight on file to calculate BMI.  General Appearance: Well Groomed  Eye Contact:  Good  Speech:  Clear and Coherent and Normal Rate  Volume:  Normal  Mood:  Euthymic  Affect:  Congruent  Thought Process:  Coherent, Goal Directed and Linear  Orientation:  Full (Time, Place, and Person)  Thought Content: Logical and Hallucinations: Auditory   Suicidal Thoughts:  No  Homicidal Thoughts:  No  Memory:  Immediate;   Good Recent;   Good Remote;   Good  Judgement:  Good  Insight:  Good  Psychomotor Activity:  Normal and Telehealth visit  Concentration:  Concentration: Good and Attention Span: Good  Recall:  Good  Fund of Knowledge: Good  Language: Good  Akathisia:  No  Handed:  Right  AIMS (if indicated): not done  Assets:  Communication Skills Desire for Improvement Financial Resources/Insurance Housing  ADL's:  Intact  Cognition: WNL  Sleep:  Fair   Screenings: AIMS    Flowsheet Row Video Visit from 05/19/2022 in [facility] Admission (Discharged) from 01/06/2016 in [facility] INPATIENT BEHAVIORAL MEDICINE  AIMS  Total Score 0 0   AUDIT    Flowsheet Row Admission (Discharged) from 05/18/2016 in [facility]AL MEDICINE Admission (Discharged) from 01/06/2016 in [facility]IENT [facility] MEDICINE  Alcohol Use Disorder Identification Test Final Score (AUDIT) 0 0   GAD-7    Flowsheet Row Video Visit from 07/26/2024 in [facility] Video Visit from 02/07/2024 in [facility] Video Visit from 11/29/2023 in [facility] Video Visit from 09/20/2023 in [facility] Video Visit from 07/05/2023 in [facility]  Total GAD-7 Score 14 9 8 7 10    PHQ2-9    Flowsheet Row Video Visit from 07/26/2024 in [facility] Video Visit from 02/07/2024 in [facility] Video Visit from 11/29/2023 in [facility] Video Visit from 09/20/2023 in [facility] Video Visit from 07/05/2023 in [facility]  PHQ-2 Total Score 2 1 4 2 2   PHQ-9 Total Score 13 8 11 8 10    Flowsheet Row Video Visit from 02/18/2021 in [facility] Video Visit from 11/24/2020 in [facility]  C-SSRS RISK CATEGORY Error: Q7 should not be populated when Q6 is No No Risk     Assessment and Plan: Patient endorses auditory hallucinations but reports that she is able to cope with it.  No medication changes made today.  Patient agreeable taking medication as prescribed.  1. Generalized anxiety disorder  Continue- busPIRone  (BUSPAR ) 15 MG tablet; Take 1 tablet (15 mg total) by mouth 3 (three) times daily.  Dispense: 90 tablet; Refill: 3 Continue- hydrOXYzine  (VISTARIL ) 50 MG capsule; Take 1 capsule (50 mg total) by mouth every 6 (six) hours as needed for anxiety.  Dispense: 120 capsule; Refill: 3  2. Schizoaffective disorder, bipolar  type (HCC)  Continue- busPIRone  (BUSPAR ) 15 MG tablet; Take 1 tablet (15 mg total) by mouth 3 (three) times daily.  Dispense: 90  tablet; Refill: 3 Continue- citalopram  (CELEXA ) 20 MG tablet; Take 1 tablet (20 mg total) by mouth daily.  Dispense: 30 tablet; Refill: 3 Continue- divalproex  (DEPAKOTE ) 500 MG DR tablet; Take 3 tablets (1,500 mg total) by mouth at bedtime.  Dispense: 90 tablet; Refill: 3 Continue- haloperidol  (HALDOL ) 5 MG tablet; Take 1 tablet (5 mg total) by mouth at bedtime.  Dispense: 30 tablet; Refill: 3 Continue- OLANZapine  (ZYPREXA ) 20 MG tablet; Take 1 tablet (20 mg total) by mouth at bedtime.  Dispense: 30 tablet; Refill: 3 Continue- traZODone  (DESYREL ) 100 MG tablet; Take 2 tablets (200 mg total) by mouth at bedtime.  Dispense: 60 tablet; Refill: 3     Follow-up in 2.5 month    Zane FORBES Bach, NP 07/26/2024, 11:43 AM

## 2024-10-02 ENCOUNTER — Telehealth (HOSPITAL_COMMUNITY): Payer: Medicare (Managed Care) | Admitting: Psychiatry

## 2024-10-02 ENCOUNTER — Encounter (HOSPITAL_COMMUNITY): Payer: Self-pay | Admitting: Psychiatry

## 2024-10-02 DIAGNOSIS — F250 Schizoaffective disorder, bipolar type: Secondary | ICD-10-CM

## 2024-10-02 DIAGNOSIS — F411 Generalized anxiety disorder: Secondary | ICD-10-CM

## 2024-10-02 MED ORDER — HYDROXYZINE PAMOATE 50 MG PO CAPS: 50.0000 mg | ORAL_CAPSULE | Freq: Four times a day (QID) | ORAL | 3 refills | Status: AC | PRN

## 2024-10-02 MED ORDER — TRAZODONE HCL 100 MG PO TABS: 200.0000 mg | ORAL_TABLET | Freq: Every day | ORAL | 3 refills | Status: AC

## 2024-10-02 MED ORDER — OLANZAPINE 20 MG PO TABS: 20.0000 mg | ORAL_TABLET | Freq: Every day | ORAL | 3 refills | Status: AC

## 2024-10-02 MED ORDER — HALOPERIDOL 5 MG PO TABS: 5.0000 mg | ORAL_TABLET | Freq: Every evening | ORAL | 3 refills | Status: AC

## 2024-10-02 MED ORDER — DIVALPROEX SODIUM 500 MG PO DR TAB: 1500.0000 mg | DELAYED_RELEASE_TABLET | Freq: Every evening | ORAL | 3 refills | Status: AC

## 2024-10-02 MED ORDER — BUSPIRONE HCL 15 MG PO TABS: 15.0000 mg | ORAL_TABLET | Freq: Three times a day (TID) | ORAL | 3 refills | Status: AC

## 2024-10-02 MED ORDER — CITALOPRAM HYDROBROMIDE 20 MG PO TABS: 20.0000 mg | ORAL_TABLET | Freq: Every day | ORAL | 3 refills | Status: AC

## 2024-10-02 NOTE — Progress Notes (Signed)
 BH MD/PA/NP OP Progress Note Virtual Visit via Video Note  I connected with Vicki Lee on 10/02/24 at 10:30 AM EST by a video enabled telemedicine application and verified that I am speaking with the correct person using two identifiers.  Location: Patient: Home Provider: Clinic   I discussed the limitations of evaluation and management by telemedicine and the availability of in person appointments. The patient expressed understanding and agreed to proceed.  I provided 30 minutes of non-face-to-face time during this encounter.             10/02/2024 10:38 AM Vicki Lee  MRN:  [id number]  Chief Complaint: I have been sick but getting better  HPI: 44 year-old female seen today for follow up psychiatric evaluation. Patient has a psychiatric history of schizophrenia, depression, and anxiety. She is currently managed on Depakote  1500 mg nightly, Buspar  15 mg TID, Haldol  5 mg, trazodone  200 mg nightly, Zyprexa  20 mg nightly, Hydroxyzine  50 mg four times daily, and Celexa  20 mg daily. Today patient notes that medications are effective in managing her symptoms.   Today she was well-groomed, pleasant, cooperative, engaged in conversation, and maintained eye contact.  She informed clinical research associate that she has been sick but notes that she is getting better. She notes that she is getting over a cold. Patient notes that in December her mother in-law passed away. She reports that she and her husband are coping.   Since her last visit se notes tat her anxiety and depression continues to be well managed. Today provider conducted GAD-7 and patient scored a 3, at her last visit she scored a 14.  Provider also conducted PHQ-9 and he scored a 6, at her last visit she scored a 13.  She endorses sleeping 5 to 6 hours nightly.  Today she denies SI/HI, mania, paranoia.  She continues to have AH which she reports worsens at night.  At this time patient request that medication not be adjusted.  No  medication changes made today.  Patient agreeable to continue medication as prescribed.  Today provider ordered prolactin level, Depakote  level, thyroid panel, lipid panel, and LFT. No other concerns noted at this time  Visit Diagnosis:  No diagnosis found.         Past Psychiatric History: Anxiety, depression, and schizophrenia Past Medical History:  Past Medical History:  Diagnosis Date   Anxiety    Crohn disease (HCC)    Depression    Fibroid    Infertility, female     Past Surgical History:  Procedure Laterality Date   COLON SURGERY  2000    due to Chron's age 60.   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   MYOMECTOMY  2019    Family Psychiatric History: Unknown  Family History:  Family History  Problem Relation Age of Onset   Hyperlipidemia Mother    Hypertension Mother    Crohn's disease Father    Crohn's disease Brother    Mental illness Maternal Grandmother    Bone cancer Maternal Grandfather    Mental illness Paternal Grandmother     Social History:  Social History   Socioeconomic History   Marital status: Married    Spouse name: Not on file   Number of children: Not on file   Years of education: Not on file   Highest education level: Not on file  Occupational History   Not on file  Tobacco Use   Smoking status: Never   Smokeless tobacco: Never  Vaping Use   Vaping status: Never Used  Substance and Sexual Activity   Alcohol use: No   Drug use: No   Sexual activity: Yes    Partners: Male    Birth control/protection: None  Other Topics Concern   Not on file  Social History Narrative   Not on file   Social Drivers of Health   Tobacco Use: Medium Risk (09/23/2024)   Received from [facility]   Patient History    Smoking Tobacco Use: Former    Smokeless Tobacco Use: Never    Passive Exposure: Not on file  Financial Resource Strain: Not on file  Food Insecurity: Low Risk (06/07/2024)   Received from [facility]   Epic    Within  the past 12 months, you worried that your food would run out before you got money to buy more: Never true    Within the past 12 months, the food you bought just didn't last and you didn't have money to get more. : Never true  Transportation Needs: No Transportation Needs (06/07/2024)   Received from [employer]    In the past 12 months, has lack of reliable transportation kept you from medical appointments, meetings, work or from getting things needed for daily living? : No  Physical Activity: Not on file  Stress: Not on file  Social Connections: Not on file  Depression (PHQ2-9): High Risk (07/26/2024)   Depression (PHQ2-9)    PHQ-2 Score: 13  Alcohol Screen: Not on file  Housing: Low Risk (06/07/2024)   Received from [facility]   Epic    What is your living situation today?: I have a steady place to live    Think about the place you live. Do you have problems with any of the following? Choose all that apply:: None/None on this list  Utilities: Low Risk (06/07/2024)   Received from [facility]   Utilities    In the past 12 months has the electric, gas, oil, or water company threatened to shut off services in your home? : No  Health Literacy: Not on file    Allergies: Not on File  Metabolic Disorder Labs: Lab Results  Component Value Date   HGBA1C 5.4 08/01/2018   Lab Results  Component Value Date   PROLACTIN 171.3 (H) 05/19/2016   PROLACTIN 159.4 (H) 01/07/2016   Lab Results  Component Value Date   CHOL 196 05/29/2018   TRIG 177 (H) 05/29/2018   HDL 39 (L) 05/29/2018   CHOLHDL 5.0 (H) 05/29/2018   VLDL 19 05/19/2016   LDLCALC 122 (H) 05/29/2018   LDLCALC 129 (H) 05/19/2016   Lab Results  Component Value Date   TSH 2.384 05/19/2016   TSH 2.461 01/07/2016    Therapeutic Level Labs: No results found for: LITHIUM No results found for: VALPROATE No results found for: CBMZ  Current Medications: Current Outpatient Medications  Medication  Sig Dispense Refill   busPIRone  (BUSPAR ) 15 MG tablet Take 1 tablet (15 mg total) by mouth 3 (three) times daily. 90 tablet 3   citalopram  (CELEXA ) 20 MG tablet Take 1 tablet (20 mg total) by mouth daily. 30 tablet 3   divalproex  (DEPAKOTE ) 500 MG DR tablet Take 3 tablets (1,500 mg total) by mouth at bedtime. 90 tablet 3   haloperidol  (HALDOL ) 5 MG tablet Take 1 tablet (5 mg total) by mouth at bedtime. 30 tablet 3   HYDROcodone -acetaminophen  (NORCO/VICODIN) 5-325 MG tablet Take 1-2 tablets by mouth every 6 (  six) hours as needed for severe pain. 12 tablet 0   hydrOXYzine  (VISTARIL ) 50 MG capsule Take 1 capsule (50 mg total) by mouth every 6 (six) hours as needed for anxiety. 120 capsule 3   Multiple Vitamins-Minerals (MULTIVITAMIN PO) Take by mouth.     OLANZapine  (ZYPREXA ) 20 MG tablet Take 1 tablet (20 mg total) by mouth at bedtime. 30 tablet 3   ondansetron  (ZOFRAN ) 4 MG tablet Take 1 tablet (4 mg total) by mouth every 6 (six) hours. 12 tablet 0   traZODone  (DESYREL ) 100 MG tablet Take 2 tablets (200 mg total) by mouth at bedtime. 60 tablet 3   No current facility-administered medications for this visit.     Musculoskeletal: Strength & Muscle Tone: within normal limits and telehealthvisit Gait & Station: normal,  telehealth visit.  Patient leans: N/A  Psychiatric Specialty Exam:   There were no vitals taken for this visit.There is no height or weight on file to calculate BMI.  General Appearance: Well Groomed  Eye Contact:  Good  Speech:  Clear and Coherent and Normal Rate  Volume:  Normal  Mood:  Euthymic  Affect:  Congruent  Thought Process:  Coherent, Goal Directed and Linear  Orientation:  Full (Time, Place, and Person)  Thought Content: Logical and Hallucinations: Auditory   Suicidal Thoughts:  No  Homicidal Thoughts:  No  Memory:  Immediate;   Good Recent;   Good Remote;   Good  Judgement:  Good  Insight:  Good  Psychomotor Activity:  Normal and Telehealth visit   Concentration:  Concentration: Good and Attention Span: Good  Recall:  Good  Fund of Knowledge: Good  Language: Good  Akathisia:  No  Handed:  Right  AIMS (if indicated): not done  Assets:  Communication Skills Desire for Improvement Financial Resources/Insurance Housing  ADL's:  Intact  Cognition: WNL  Sleep:  Fair   Screenings: AIMS    Flowsheet Row Video Visit from 05/19/2022 in [facility] Admission (Discharged) from 01/06/2016 in [facility] INPATIENT BEHAVIORAL MEDICINE  AIMS Total Score 0 0   AUDIT    Flowsheet Row Admission (Discharged) from 05/18/2016 in [facility] INPATIENT BEHAVIORAL MEDICINE Admission (Discharged) from 01/06/2016 in [facility]PATIENT [facility]AL MEDICINE  Alcohol Use Disorder Identification Test Final Score (AUDIT) 0 0   GAD-7    Flowsheet Row Video Visit from 07/26/2024 in [facility] Video Visit from 02/07/2024 in [facility] Video Visit from 11/29/2023 in [facility] Video Visit from 09/20/2023 in [facility] Video Visit from 07/05/2023 in [facility]  Total GAD-7 Score 14 9 8 7 10    PHQ2-9    Flowsheet Row Video Visit from 07/26/2024 in [facility] Video Visit from 02/07/2024 in [facility] Video Visit from 11/29/2023 in [facility] Video Visit from 09/20/2023 in [facility] Video Visit from 07/05/2023 in [facility]  PHQ-2 Total Score 2 1 4 2 2   PHQ-9 Total Score 13 8 11 8 10    Flowsheet Row Video Visit from 02/18/2021 in [facility] Video Visit from 11/24/2020 in [facility]  C-SSRS RISK CATEGORY Error: Q7 should not be populated when Q6 is No No Risk     Assessment and Plan: Patient endorses auditory  hallucinations but reports that she is able to cope with it.  She  notes that her anxiety and depression are well-managed.  At this time patient request that medication not be adjusted.  No medication changes made today.  Patient agreeable to continue medication as prescribed.  Today provider ordered prolactin level, Depakote  level, thyroid panel, lipid panel, and LFT.  1. Generalized anxiety disorder  Continue- hydrOXYzine  (VISTARIL ) 50 MG capsule; Take 1 capsule (50 mg total) by mouth every 6 (six) hours as needed for anxiety.  Dispense: 120 capsule; Refill: 3 Continue- busPIRone  (BUSPAR ) 15 MG tablet; Take 1 tablet (15 mg total) by mouth 3 (three) times daily.  Dispense: 90 tablet; Refill: 3  2. Schizoaffective disorder, bipolar type (HCC)  Continue- haloperidol  (HALDOL ) 5 MG tablet; Take 1 tablet (5 mg total) by mouth at bedtime.  Dispense: 30 tablet; Refill: 3 Continue- traZODone  (DESYREL ) 100 MG tablet; Take 2 tablets (200 mg total) by mouth at bedtime.  Dispense: 60 tablet; Refill: 3 Continue- OLANZapine  (ZYPREXA ) 20 MG tablet; Take 1 tablet (20 mg total) by mouth at bedtime.  Dispense: 30 tablet; Refill: 3 Continue- citalopram  (CELEXA ) 20 MG tablet; Take 1 tablet (20 mg total) by mouth daily.  Dispense: 30 tablet; Refill: 3 Continue- busPIRone  (BUSPAR ) 15 MG tablet; Take 1 tablet (15 mg total) by mouth 3 (three) times daily.  Dispense: 90 tablet; Refill: 3 Continue- divalproex  (DEPAKOTE ) 500 MG DR tablet; Take 3 tablets (1,500 mg total) by mouth at bedtime.  Dispense: 90 tablet; Refill: 3 - Valproic Acid level; Future - Prolactin; Future - Lipid panel; Future - Thyroid Panel With TSH; Future - Hepatic function panel; Future  Follow-up in 2.5 month    Zane FORBES Bach, NP 10/02/2024, 10:38 AM

## 2024-12-04 ENCOUNTER — Telehealth (HOSPITAL_COMMUNITY): Payer: Medicare (Managed Care) | Admitting: Psychiatry
